# Patient Record
Sex: Male | Born: 1965 | Race: White | Hispanic: No | Marital: Married | Smoking: Never smoker
Health system: Southern US, Community
[De-identification: ages and names within clinical notes are randomized; demographics above are authoritative.]

## PROBLEM LIST (undated history)

## (undated) DIAGNOSIS — K760 Fatty (change of) liver, not elsewhere classified: Secondary | ICD-10-CM

## (undated) DIAGNOSIS — C4490 Unspecified malignant neoplasm of skin, unspecified: Secondary | ICD-10-CM

## (undated) DIAGNOSIS — E119 Type 2 diabetes mellitus without complications: Secondary | ICD-10-CM

## (undated) DIAGNOSIS — T4145XA Adverse effect of unspecified anesthetic, initial encounter: Secondary | ICD-10-CM

## (undated) DIAGNOSIS — I10 Essential (primary) hypertension: Secondary | ICD-10-CM

## (undated) DIAGNOSIS — E669 Obesity, unspecified: Secondary | ICD-10-CM

## (undated) DIAGNOSIS — K219 Gastro-esophageal reflux disease without esophagitis: Secondary | ICD-10-CM

## (undated) DIAGNOSIS — R1319 Other dysphagia: Secondary | ICD-10-CM

## (undated) DIAGNOSIS — D649 Anemia, unspecified: Secondary | ICD-10-CM

## (undated) DIAGNOSIS — T7840XA Allergy, unspecified, initial encounter: Secondary | ICD-10-CM

## (undated) DIAGNOSIS — C801 Malignant (primary) neoplasm, unspecified: Secondary | ICD-10-CM

## (undated) DIAGNOSIS — T8859XA Other complications of anesthesia, initial encounter: Secondary | ICD-10-CM

## (undated) DIAGNOSIS — E785 Hyperlipidemia, unspecified: Secondary | ICD-10-CM

## (undated) DIAGNOSIS — G4730 Sleep apnea, unspecified: Secondary | ICD-10-CM

## (undated) HISTORY — DX: Unspecified malignant neoplasm of skin, unspecified: C44.90

## (undated) HISTORY — PX: BIOPSY OF SKIN SUBCUTANEOUS TISSUE AND/OR MUCOUS MEMBRANE: SHX6741

## (undated) HISTORY — PX: TONSILLECTOMY AND ADENOIDECTOMY: SUR1326

## (undated) HISTORY — DX: Malignant (primary) neoplasm, unspecified: C80.1

## (undated) HISTORY — DX: Essential (primary) hypertension: I10

## (undated) HISTORY — DX: Anemia, unspecified: D64.9

## (undated) HISTORY — DX: Hyperlipidemia, unspecified: E78.5

## (undated) HISTORY — DX: Gastro-esophageal reflux disease without esophagitis: K21.9

## (undated) HISTORY — PX: HERNIA REPAIR: SHX51

## (undated) HISTORY — DX: Allergy, unspecified, initial encounter: T78.40XA

---

## 1998-11-08 HISTORY — PX: CHOLESTEATOMA EXCISION: SHX1345

## 2005-02-26 ENCOUNTER — Ambulatory Visit: Payer: Self-pay | Admitting: Otolaryngology

## 2006-08-29 ENCOUNTER — Other Ambulatory Visit: Payer: Self-pay

## 2006-08-29 ENCOUNTER — Emergency Department: Payer: Self-pay

## 2006-11-28 ENCOUNTER — Ambulatory Visit: Payer: Self-pay | Admitting: Internal Medicine

## 2008-11-18 ENCOUNTER — Ambulatory Visit: Payer: Self-pay | Admitting: Specialist

## 2011-09-20 ENCOUNTER — Ambulatory Visit: Payer: Self-pay | Admitting: Internal Medicine

## 2011-10-12 ENCOUNTER — Ambulatory Visit: Payer: Self-pay | Admitting: Internal Medicine

## 2011-11-12 ENCOUNTER — Encounter: Payer: Self-pay | Admitting: Internal Medicine

## 2011-11-12 ENCOUNTER — Ambulatory Visit (INDEPENDENT_AMBULATORY_CARE_PROVIDER_SITE_OTHER): Payer: PRIVATE HEALTH INSURANCE | Admitting: Internal Medicine

## 2011-11-12 VITALS — BP 134/84 | HR 84 | Temp 98.8°F | Wt 206.0 lb

## 2011-11-12 DIAGNOSIS — R1906 Epigastric swelling, mass or lump: Secondary | ICD-10-CM

## 2011-11-12 DIAGNOSIS — E785 Hyperlipidemia, unspecified: Secondary | ICD-10-CM

## 2011-11-12 DIAGNOSIS — R5381 Other malaise: Secondary | ICD-10-CM

## 2011-11-12 DIAGNOSIS — R5383 Other fatigue: Secondary | ICD-10-CM

## 2011-11-12 DIAGNOSIS — R1900 Intra-abdominal and pelvic swelling, mass and lump, unspecified site: Secondary | ICD-10-CM

## 2011-11-12 DIAGNOSIS — I10 Essential (primary) hypertension: Secondary | ICD-10-CM

## 2011-11-12 NOTE — Progress Notes (Signed)
Subjective:    Patient ID: Christopher Vazquez, male    DOB: [birth year], 46 y.o.   MRN: [id number]  HPIMr. Vazquez is a 46 yo white male with a history of dyslipidemia primarily a low HDL, and hypertensionwho presnts for followu on lipids that he has brought with him from an outside source,  Regarding his hypertension, he notes slight loss of loss of control over the last 3 weeks, without use of OTC oral decongestants, or NSAIDs.  He was treated for influenza 3 weeks ago and has not been exercising regularly as a result.  His home bps have 125-129/80's .  He has noted more  fatigue for  the past 3 weeks.  He does have a history of occasional snoring,and underwent a tonsillectomy at age 46 when OSA was diagnosed with a sleep study.  He has not had a repeat evaluation.  He notes occasional hypersomnolence in the afternoon. He has noted a weight gain of 8 lbs in the last 6 weeks . His third issue is some tenderness just below his xiphoid process accompanied by swelling, which has been present for 4 or 5 weeks.  It has not pressed ,  The swelling has not increased.  He does not report indigestion, chest pain or nausea.  Past Medical History  Diagnosis Date  . Hyperlipidemia   . Hypertension    No current outpatient prescriptions on file prior to visit.    Review of Systems  Constitutional: Positive for fatigue and unexpected weight change. Negative for fever, chills, diaphoresis, activity change and appetite change.  HENT: Negative for hearing loss, ear pain, nosebleeds, congestion, sore throat, facial swelling, rhinorrhea, sneezing, drooling, mouth sores, trouble swallowing, neck pain, neck stiffness, dental problem, voice change, postnasal drip, sinus pressure, tinnitus and ear discharge.   Eyes: Negative for photophobia, pain, discharge, redness, itching and visual disturbance.  Respiratory: Negative for apnea, cough, choking, chest tightness, shortness of breath, wheezing and stridor.   Cardiovascular:  Negative for chest pain, palpitations and leg swelling.  Gastrointestinal: Negative for nausea, vomiting, abdominal pain, diarrhea, constipation, blood in stool, abdominal distention, anal bleeding and rectal pain.  Genitourinary: Negative for dysuria, urgency, frequency, hematuria, flank pain, decreased urine volume, scrotal swelling, difficulty urinating and testicular pain.  Musculoskeletal: Negative for myalgias, back pain, joint swelling, arthralgias and gait problem.  Skin: Negative for color change, rash and wound.  Neurological: Negative for dizziness, tremors, seizures, syncope, speech difficulty, weakness, light-headedness, numbness and headaches.  Psychiatric/Behavioral: Negative for suicidal ideas, hallucinations, behavioral problems, confusion, sleep disturbance, dysphoric mood, decreased concentration and agitation. The patient is not nervous/anxious.    BP 134/84  Pulse 84  Temp(Src) 98.8 F (37.1 C) (Oral)  Wt 206 lb (93.441 kg)     Objective:   Physical Exam  Constitutional: He is oriented to person, place, and time.  HENT:  Head: Normocephalic and atraumatic.  Mouth/Throat: Oropharynx is clear and moist.  Eyes: Conjunctivae and EOM are normal.  Neck: Normal range of motion. Neck supple. No JVD present. No thyromegaly present.  Cardiovascular: Normal rate, regular rhythm and normal heart sounds.   Pulmonary/Chest: Effort normal and breath sounds normal. He has no wheezes. He has no rales.  Abdominal: Soft. Bowel sounds are normal. He exhibits mass. There is tenderness. There is rebound.    Musculoskeletal: Normal range of motion. He exhibits no edema.  Neurological: He is alert and oriented to person, place, and time.  Skin: Skin is warm and dry.  Psychiatric: He has a normal  mood and affect.     Assessment & Plan:   Mass of abdomen Given his history of hypertension and FH of AAA, will send to vascular surgery for an ultrasound of his abdomen to rule out  same  Hypertension We are increasing his enalapril/hct for diastolic elevation.   Hyperlipidemia Recommended Mediterranean diet for low HDL ,  Given intolerance to Niaspan.   Fatigue Etiology unclear, he had normal thyroi, liver and kidney  function last month when he had labs done.  May be secondary to recent influenza.  Recommended resuming his exercise program and reevaluate symptom in 4 weeks.  Consider repeat Sleep study is labs are normal.     Updated Medication List Outpatient Encounter Prescriptions as of 11/12/2011  Medication Sig Dispense Refill  . aspirin 81 MG tablet Take 81 mg by mouth daily.        . Enalapril-Hydrochlorothiazide 5-12.5 MG per tablet Take 1 tablet by mouth daily.        . fenofibrate 160 MG tablet Take 160 mg by mouth daily.        . simvastatin (ZOCOR) 20 MG tablet Take 20 mg by mouth every evening.

## 2011-11-12 NOTE — Patient Instructions (Signed)
Increase enalapril combo to 2 pills daily in the am  You may enjoy a glass of wine at dinner,  It may help your HDL  Ask you wife about your breathing pattern.  Get back to the gym on a regular basis,  If the fatigue isn't better in 4 weeks,  Call for additional labs.   We are ordering an abdominal aortic ultrasound

## 2011-11-14 ENCOUNTER — Encounter: Payer: Self-pay | Admitting: Internal Medicine

## 2011-11-14 DIAGNOSIS — I10 Essential (primary) hypertension: Secondary | ICD-10-CM | POA: Insufficient documentation

## 2011-11-14 DIAGNOSIS — R1900 Intra-abdominal and pelvic swelling, mass and lump, unspecified site: Secondary | ICD-10-CM | POA: Insufficient documentation

## 2011-11-14 DIAGNOSIS — E785 Hyperlipidemia, unspecified: Secondary | ICD-10-CM | POA: Insufficient documentation

## 2011-11-14 DIAGNOSIS — R5383 Other fatigue: Secondary | ICD-10-CM | POA: Insufficient documentation

## 2011-11-14 NOTE — Assessment & Plan Note (Signed)
We are increasing his enalapril/hct for diastolic elevation.

## 2011-11-14 NOTE — Assessment & Plan Note (Signed)
Given his history of hypertension and FH of AAA, will send to vascular surgery for an ultrasound of his abdomen to rule out same

## 2011-11-14 NOTE — Assessment & Plan Note (Signed)
Recommended Mediterranean diet for low HDL ,  Given intolerance to Niaspan.

## 2011-11-14 NOTE — Assessment & Plan Note (Signed)
Etiology unclear, he had normal thyroi, liver and kidney  function last month when he had labs done.  May be secondary to recent influenza.  Recommended resuming his exercise program and reevaluate symptom in 4 weeks.  Consider repeat Sleep study is labs are normal.

## 2011-11-30 ENCOUNTER — Telehealth: Payer: Self-pay | Admitting: Internal Medicine

## 2011-11-30 NOTE — Telephone Encounter (Signed)
Advised patient

## 2011-11-30 NOTE — Telephone Encounter (Signed)
His abdominal ultrasoudn was normal.  No aneurysm

## 2011-12-13 ENCOUNTER — Encounter: Payer: Self-pay | Admitting: Internal Medicine

## 2012-01-07 ENCOUNTER — Ambulatory Visit (INDEPENDENT_AMBULATORY_CARE_PROVIDER_SITE_OTHER): Payer: PRIVATE HEALTH INSURANCE | Admitting: Internal Medicine

## 2012-01-07 ENCOUNTER — Encounter: Payer: Self-pay | Admitting: Internal Medicine

## 2012-01-07 VITALS — BP 112/78 | HR 84 | Temp 98.3°F | Resp 12 | Wt 201.0 lb

## 2012-01-07 DIAGNOSIS — L0390 Cellulitis, unspecified: Secondary | ICD-10-CM

## 2012-01-07 DIAGNOSIS — A4902 Methicillin resistant Staphylococcus aureus infection, unspecified site: Secondary | ICD-10-CM

## 2012-01-07 DIAGNOSIS — M130 Polyarthritis, unspecified: Secondary | ICD-10-CM

## 2012-01-07 DIAGNOSIS — I10 Essential (primary) hypertension: Secondary | ICD-10-CM

## 2012-01-07 DIAGNOSIS — L0291 Cutaneous abscess, unspecified: Secondary | ICD-10-CM

## 2012-01-07 DIAGNOSIS — B9562 Methicillin resistant Staphylococcus aureus infection as the cause of diseases classified elsewhere: Secondary | ICD-10-CM

## 2012-01-07 MED ORDER — ENALAPRIL-HYDROCHLOROTHIAZIDE 10-25 MG PO TABS: 1.0000 | ORAL_TABLET | Freq: Every day | ORAL | Status: DC

## 2012-01-07 MED ORDER — SULFAMETHOXAZOLE-TRIMETHOPRIM 800-160 MG PO TABS: 1.0000 | ORAL_TABLET | Freq: Two times a day (BID) | ORAL | Status: AC

## 2012-01-07 MED ORDER — MELOXICAM 15 MG PO TABS: 15.0000 mg | ORAL_TABLET | Freq: Every day | ORAL | Status: DC

## 2012-01-07 NOTE — Progress Notes (Signed)
Subjective:    Patient ID: Christopher Vazquez, male    DOB: 04/03/1966, 46 y.o.   MRN: [id number]  HPI   Christopher Vazquez is a 46 yo white male with a history of hypertension and hyperlipidemia  who presents with 2 complaints.  ) He has been having joint pain involving bilateral hands, knees, shoulders, and wrists for 2 months.  His pain is greatest in the morning  and brought on with inactivity and improves after 20 minutes and after standing in a hot shower.  2) 2nd issue is the recurrence of skin boils  Which are occurring on his arms, legs and dorsum of feet.  He was treated for this in the past with oral Septra and lesions resolved.  No recent use of public gym or swlimming, no recent changes in medications , soaps or detergents.   Past Medical History  Diagnosis Date  . Hyperlipidemia   . Hypertension    Current Outpatient Prescriptions on File Prior to Visit  Medication Sig Dispense Refill  . aspirin 81 MG tablet Take 81 mg by mouth daily.       . fenofibrate 160 MG tablet Take 160 mg by mouth daily.        . simvastatin (ZOCOR) 20 MG tablet Take 20 mg by mouth every evening.          Review of Systems  Constitutional: Positive for fatigue and unexpected weight change. Negative for fever, chills, diaphoresis, activity change and appetite change.  HENT: Negative for hearing loss, ear pain, nosebleeds, congestion, sore throat, facial swelling, rhinorrhea, sneezing, drooling, mouth sores, trouble swallowing, neck pain, neck stiffness, dental problem, voice change, postnasal drip, sinus pressure, tinnitus and ear discharge.   Eyes: Negative for photophobia, pain, discharge, redness, itching and visual disturbance.  Respiratory: Negative for apnea, cough, choking, chest tightness, shortness of breath, wheezing and stridor.   Cardiovascular: Negative for chest pain, palpitations and leg swelling.  Gastrointestinal: Negative for nausea, vomiting, abdominal pain, diarrhea, constipation, blood in stool,  abdominal distention, anal bleeding and rectal pain.  Genitourinary: Negative for dysuria, urgency, frequency, hematuria, flank pain, decreased urine volume, scrotal swelling, difficulty urinating and testicular pain.  Musculoskeletal: Positive for arthralgias. Negative for myalgias, back pain, joint swelling and gait problem.  Skin: Positive for rash. Negative for color change and wound.  Neurological: Negative for dizziness, tremors, seizures, syncope, speech difficulty, weakness, light-headedness, numbness and headaches.  Psychiatric/Behavioral: Negative for suicidal ideas, hallucinations, behavioral problems, confusion, sleep disturbance, dysphoric mood, decreased concentration and agitation. The patient is not nervous/anxious.        Objective:   Physical Exam  Constitutional: He is oriented to person, place, and time.  HENT:  Head: Normocephalic and atraumatic.  Mouth/Throat: Oropharynx is clear and moist.  Eyes: Conjunctivae and EOM are normal.  Neck: Normal range of motion. Neck supple. No JVD present. No thyromegaly present.  Cardiovascular: Normal rate, regular rhythm and normal heart sounds.   Pulmonary/Chest: Effort normal and breath sounds normal. He has no wheezes. He has no rales.  Abdominal: Soft. Bowel sounds are normal. He exhibits no mass. There is no tenderness. There is no rebound.  Musculoskeletal: Normal range of motion. He exhibits no edema.  Neurological: He is alert and oriented to person, place, and time.  Skin: Skin is warm and dry. Rash noted. Rash is pustular.       Discrete lesions, some pustular, some maculopapular  diffusely scattered on arms, legs and feet (dorsal side)  Psychiatric: He has a  normal mood and affect.      Assessment & Plan:   Polyarthritis He has no signs of synovitis on exam but his history is concerning for autoimmune /inflammatory arthritis and he has a strong FH of RA.  Checking serologies, trial of meloxciam.   MRSA  cellulitis Suspected. Will treat with bactrim for one week recommend use of hibiclens once weekly and continued [place] of antibacterial soap.  If no resolution will refer to [facility] ID specialist for evaluation.    Hypertension Well controlled on current medications.  No changes today.    Updated Medication List Outpatient Encounter Prescriptions as of 01/07/2012  Medication Sig Dispense Refill  . aspirin 81 MG tablet Take 81 mg by mouth daily.       . fenofibrate 160 MG tablet Take 160 mg by mouth daily.        . simvastatin (ZOCOR) 20 MG tablet Take 20 mg by mouth every evening.        Marland Kitchen DISCONTD: Enalapril-Hydrochlorothiazide 5-12.5 MG per tablet Take 1 tablet by mouth 2 (two) times daily.       . enalapril-hydrochlorothiazide (VASERETIC) 10-25 MG per tablet Take 1 tablet by mouth daily.  90 tablet  3  . meloxicam (MOBIC) 15 MG tablet Take 1 tablet (15 mg total) by mouth daily.  30 tablet  0  . sulfamethoxazole-trimethoprim (SEPTRA DS) 800-160 MG per tablet Take 1 tablet by mouth 2 (two) times daily.  14 tablet  0

## 2012-01-07 NOTE — Patient Instructions (Signed)
We are going to do a week of Septra Ds one  tablet twice daily x 7 days.  Once a week bathe with HibiClens .

## 2012-01-09 DIAGNOSIS — B9562 Methicillin resistant Staphylococcus aureus infection as the cause of diseases classified elsewhere: Secondary | ICD-10-CM | POA: Insufficient documentation

## 2012-01-09 NOTE — Assessment & Plan Note (Signed)
Suspected. Will treat with bactrim for one week recommend use of hibiclens once weekly and continued [place] of antibacterial soap.  If no resolution will refer to [facility] ID specialist for evaluation.

## 2012-01-09 NOTE — Assessment & Plan Note (Addendum)
He has no signs of synovitis on exam but his history is concerning for autoimmune /inflammatory arthritis and he has a strong FH of RA.  Checking serologies, trial of meloxciam.

## 2012-01-09 NOTE — Assessment & Plan Note (Signed)
Well controlled on current medications.  No changes today. 

## 2012-01-14 ENCOUNTER — Telehealth: Payer: Self-pay | Admitting: Internal Medicine

## 2012-01-14 NOTE — Telephone Encounter (Signed)
Notified patient of results 

## 2012-01-14 NOTE — Telephone Encounter (Signed)
No signs of inflammatory arthritis on recent labs

## 2012-02-03 ENCOUNTER — Encounter: Payer: Self-pay | Admitting: Internal Medicine

## 2012-02-08 MED ORDER — MELOXICAM 15 MG PO TABS: 15.0000 mg | ORAL_TABLET | Freq: Every day | ORAL | Status: DC

## 2012-02-08 MED ORDER — SULFAMETHOXAZOLE-TRIMETHOPRIM 800-160 MG PO TABS: 1.0000 | ORAL_TABLET | Freq: Two times a day (BID) | ORAL | Status: AC

## 2012-03-06 ENCOUNTER — Other Ambulatory Visit: Payer: Self-pay | Admitting: Internal Medicine

## 2012-03-06 MED ORDER — MELOXICAM 15 MG PO TABS: 15.0000 mg | ORAL_TABLET | Freq: Every day | ORAL | Status: AC

## 2012-03-31 ENCOUNTER — Telehealth: Payer: Self-pay | Admitting: *Deleted

## 2012-03-31 DIAGNOSIS — N178 Other acute kidney failure: Secondary | ICD-10-CM

## 2012-03-31 MED ORDER — ERGOCALCIFEROL 1.25 MG (50000 UT) PO CAPS: 50000.0000 [IU] | ORAL_CAPSULE | ORAL | Status: DC

## 2012-03-31 NOTE — Telephone Encounter (Signed)
Cholesterol, fine, vitamin d is low .  And kidney function is a little bit off an I have no prior labs for comparison.  I woul lkike him to 1) take 50,000 units of Vit D (Drisdol ) weekly for one month I wil call to pharmacy  2) Repeat a BMET ine one week in a nonfasting state to see if kidney fucntion is better,  I will put in [facility] order as well

## 2012-03-31 NOTE — Telephone Encounter (Signed)
Request for lab results; no report found in EMR/SLS Please advise.

## 2012-04-04 NOTE — Telephone Encounter (Signed)
Patient informed; requested lab order printed for work lab/[facility]-done/SLS

## 2012-04-07 ENCOUNTER — Encounter: Payer: Self-pay | Admitting: Internal Medicine

## 2012-04-07 ENCOUNTER — Ambulatory Visit (INDEPENDENT_AMBULATORY_CARE_PROVIDER_SITE_OTHER): Payer: PRIVATE HEALTH INSURANCE | Admitting: Internal Medicine

## 2012-04-07 VITALS — BP 118/80 | HR 82 | Temp 98.1°F | Resp 16 | Wt 200.5 lb

## 2012-04-07 DIAGNOSIS — A4902 Methicillin resistant Staphylococcus aureus infection, unspecified site: Secondary | ICD-10-CM

## 2012-04-07 DIAGNOSIS — B9562 Methicillin resistant Staphylococcus aureus infection as the cause of diseases classified elsewhere: Secondary | ICD-10-CM

## 2012-04-07 DIAGNOSIS — L0291 Cutaneous abscess, unspecified: Secondary | ICD-10-CM

## 2012-04-07 DIAGNOSIS — I10 Essential (primary) hypertension: Secondary | ICD-10-CM

## 2012-04-07 DIAGNOSIS — E785 Hyperlipidemia, unspecified: Secondary | ICD-10-CM

## 2012-04-07 DIAGNOSIS — M130 Polyarthritis, unspecified: Secondary | ICD-10-CM

## 2012-04-07 NOTE — Patient Instructions (Signed)
Consider the Low Glycemic Index Diet and 6 smaller meals daily .  This boosts your metabolism and regulates your sugars:   7 AM Low carbohydrate Protein  Shakes (EAS Carb Control  Or Atkins ,  Available everywhere,   In  cases at [employer] )  2.5 carbs  (Add or substitute a toasted sandwhich thin w/ peanut butter)  10 AM: Protein bar by Atkins (snack size,  Chocolate lover's variety at  [employer])    Lunch: sandwich on pita bread or flatbread (Vedh's makes a pita bread and a flat bread , available at [employer] and [employer]; Toufayah makes a low carb flatbread available at [employer] and [employer]) Mission makes a low carb whole wheat tortilla available at [employer],and most grocery stores   3 PM:  Mid day :  Another protein bar,  Or a  cheese stick, 1/4 cup of almonds, walnuts, pistachios, pecans, peanuts,  Macadamia nuts  6 PM  Dinner:  "mean and green:"  Meat/chicken/fish, salad, and green veggie : use ranch, vinagrette,  Blue cheese, etc  9 PM snack : Breyer's low carb fudgsicle or  ice cream bar (Carb Smart), or  Weight Watcher's ice cream bar , or another protein shake  

## 2012-04-07 NOTE — Progress Notes (Signed)
Patient ID: Christopher Vazquez, male   DOB: 1965-11-29, 46 y.o.   MRN: [id number]  Patient Active Problem List  Diagnoses  . Hyperlipidemia  . Hypertension  . Mass of abdomen  . Fatigue  . Polyarthritis  . MRSA cellulitis    Subjective:  CC:   Chief Complaint  Patient presents with  . Follow-up    HPI:   Christopher Vazquez a 46 y.o. male who presents for follow up on skin rash and arthritis, along with hypertension.  He was treated for folliculitis by dermatologist with topical clindamcyin , to be used daily for 4 months along with once weekly hibiclens .  Skin has cleared up.  [facility]. His previous visit for workup of  Arthritis has resulted in a normal serologic evaluation.  The pain has resolved spontaneously.  He is no longer using meloxicam.     Past Medical History  Diagnosis Date  . Hyperlipidemia   . Hypertension     History reviewed. No pertinent past surgical history.       The following portions of the patient's history were reviewed and updated as appropriate: Allergies, current medications, and problem list.    Review of Systems:   12 Pt  review of systems was negative except those addressed in the HPI,     History   Social History  . Marital Status: Married    Spouse Name: N/A    Number of Children: N/A  . Years of Education: N/A   Occupational History  . Not on file.   Social History Main Topics  . Smoking status: Never Smoker   . Smokeless tobacco: Never Used  . Alcohol Use: Not on file  . Drug Use: Not on file  . Sexually Active: Not on file   Other Topics Concern  . Not on file   Social History Narrative  . No narrative on file    Objective:  BP 118/80  Pulse 82  Temp(Src) 98.1 F (36.7 C) (Oral)  Resp 16  Wt 200 lb 8 oz (90.946 kg)  SpO2 97%  General appearance: alert, cooperative and appears stated age Ears: normal TM's and external ear canals both ears Throat: lips, mucosa, and tongue normal;  teeth and gums normal Neck: no adenopathy, no carotid bruit, supple, symmetrical, trachea midline and thyroid not enlarged, symmetric, no tenderness/mass/nodules Back: symmetric, no curvature. ROM normal. No CVA tenderness. Lungs: clear to auscultation bilaterally Heart: regular rate and rhythm, S1, S2 normal, no murmur, click, rub or gallop Abdomen: soft, non-tender; bowel sounds normal; no masses,  no organomegaly Pulses: 2+ and symmetric Skin: Skin color, texture, turgor normal. No rashes or lesions Lymph nodes: Cervical, supraclavicular, and axillary nodes normal.  Assessment and Plan:  Hypertension Well controlled on current medications.  No changes today.  MRSA cellulitis Now fresolved with topical clindamycin and weekly hibiclens.   Polyarthritis All serologies were negative,  And symptoms have resolved.   Hyperlipidemia Managed with simvastatin,  Repeat lipids and LFTs are due     Updated Medication List Outpatient Encounter Prescriptions as of 04/07/2012  Medication Sig Dispense Refill  . aspirin 81 MG tablet Take 81 mg by mouth daily.       . enalapril-hydrochlorothiazide (VASERETIC) 10-25 MG per tablet Take 1 tablet by mouth daily.  90 tablet  3  . ergocalciferol (DRISDOL) 50000 UNITS capsule Take 1 capsule (50,000 Units total) by mouth once a week.  4 capsule  0  . fenofibrate 160 MG tablet Take 160  mg by mouth daily.        . meloxicam (MOBIC) 15 MG tablet Take 1 tablet (15 mg total) by mouth daily.  90 tablet  2  . simvastatin (ZOCOR) 20 MG tablet Take 20 mg by mouth every evening.

## 2012-04-09 ENCOUNTER — Encounter: Payer: Self-pay | Admitting: Internal Medicine

## 2012-04-09 NOTE — Assessment & Plan Note (Signed)
Managed with simvastatin,  Repeat lipids and LFTs are due

## 2012-04-09 NOTE — Assessment & Plan Note (Signed)
Now fresolved with topical clindamycin and weekly hibiclens.

## 2012-04-09 NOTE — Assessment & Plan Note (Signed)
Well controlled on current medications.  No changes today. 

## 2012-04-09 NOTE — Assessment & Plan Note (Signed)
All serologies were negative,  And symptoms have resolved.

## 2012-04-11 ENCOUNTER — Telehealth: Payer: Self-pay | Admitting: Internal Medicine

## 2012-04-11 NOTE — Telephone Encounter (Signed)
his metabolic panel was fine.  Continue currentmedicaitons

## 2012-04-11 NOTE — Telephone Encounter (Signed)
Let detailed message notifying patient.

## 2012-04-17 ENCOUNTER — Encounter: Payer: Self-pay | Admitting: Internal Medicine

## 2012-04-18 ENCOUNTER — Other Ambulatory Visit: Payer: Self-pay | Admitting: Internal Medicine

## 2012-04-19 ENCOUNTER — Encounter: Payer: Self-pay | Admitting: Internal Medicine

## 2012-04-20 ENCOUNTER — Other Ambulatory Visit: Payer: Self-pay | Admitting: *Deleted

## 2012-04-20 MED ORDER — FENOFIBRATE 160 MG PO TABS: 160.0000 mg | ORAL_TABLET | Freq: Every day | ORAL | Status: DC

## 2012-05-16 ENCOUNTER — Ambulatory Visit: Payer: Self-pay | Admitting: Family Medicine

## 2012-07-11 ENCOUNTER — Ambulatory Visit: Payer: PRIVATE HEALTH INSURANCE | Admitting: Internal Medicine

## 2012-07-25 ENCOUNTER — Encounter: Payer: Self-pay | Admitting: Internal Medicine

## 2012-07-25 DIAGNOSIS — K649 Unspecified hemorrhoids: Secondary | ICD-10-CM

## 2012-07-25 MED ORDER — HYDROCORTISONE ACE-PRAMOXINE 1-1 % RE FOAM: 1.0000 | Freq: Two times a day (BID) | RECTAL | Status: DC

## 2012-08-21 ENCOUNTER — Other Ambulatory Visit: Payer: Self-pay | Admitting: Internal Medicine

## 2012-08-24 ENCOUNTER — Other Ambulatory Visit: Payer: Self-pay | Admitting: Internal Medicine

## 2012-08-24 DIAGNOSIS — E785 Hyperlipidemia, unspecified: Secondary | ICD-10-CM

## 2012-08-24 DIAGNOSIS — Z79899 Other long term (current) drug therapy: Secondary | ICD-10-CM

## 2012-08-28 MED ORDER — FENOFIBRATE 160 MG PO TABS: 160.0000 mg | ORAL_TABLET | Freq: Every day | ORAL | Status: DC

## 2012-08-28 NOTE — Telephone Encounter (Signed)
Ok to refill 

## 2012-08-31 ENCOUNTER — Other Ambulatory Visit: Payer: Self-pay

## 2012-09-11 ENCOUNTER — Telehealth: Payer: Self-pay | Admitting: Internal Medicine

## 2012-09-11 NOTE — Telephone Encounter (Signed)
Labss reviewed. His cholesterol is fine except his HDL was a little low at 32;  this can be improved with exercise and in the Mediterranean diet. His hemoglobin A1c was slightly high which means he is to be careful about his carbohydrates and weight to prevent development of diabetes..  his vitamin D level was slightly low at 29. He needs to be taking 1000 units of vitamin D daily.

## 2012-09-14 NOTE — Telephone Encounter (Signed)
Called patient to let him know results.

## 2012-12-23 ENCOUNTER — Other Ambulatory Visit: Payer: Self-pay

## 2012-12-25 ENCOUNTER — Encounter: Payer: Self-pay | Admitting: Internal Medicine

## 2012-12-25 MED ORDER — ENALAPRIL-HYDROCHLOROTHIAZIDE 10-25 MG PO TABS: 1.0000 | ORAL_TABLET | Freq: Every day | ORAL | Status: DC

## 2013-02-06 ENCOUNTER — Ambulatory Visit: Payer: Self-pay | Admitting: Internal Medicine

## 2013-02-06 ENCOUNTER — Encounter: Payer: Self-pay | Admitting: Adult Health

## 2013-02-06 ENCOUNTER — Ambulatory Visit (INDEPENDENT_AMBULATORY_CARE_PROVIDER_SITE_OTHER): Payer: BC Managed Care – PPO | Admitting: Adult Health

## 2013-02-06 VITALS — BP 144/88 | HR 83 | Temp 98.8°F | Ht 67.0 in | Wt 204.0 lb

## 2013-02-06 DIAGNOSIS — R1011 Right upper quadrant pain: Secondary | ICD-10-CM | POA: Insufficient documentation

## 2013-02-06 NOTE — Progress Notes (Signed)
  Subjective:    Patient ID: Christopher Vazquez, male    DOB: August 13, 1966, 47 y.o.   MRN: [id number]  HPI  Patient is a pleasant 47 y/o male who presents to clinic with a 2 week hx of epigastric, RUQ pain post meals. The pain radiates to his back. He is also experiencing some LUQ tenderness. Patient saw a NP at his job who ran blood work and started him on PPI. He was going to have an abdominal ultrasound done but contracted the Arna Medici virus from his son. He was sick for ~ 1 week. His symptoms remain. He denies fever, chills.    Current Outpatient Prescriptions on File Prior to Visit  Medication Sig Dispense Refill  . aspirin 81 MG tablet Take 81 mg by mouth daily.       . enalapril-hydrochlorothiazide (VASERETIC) 10-25 MG per tablet Take 1 tablet by mouth daily.  90 tablet  3  . fenofibrate 160 MG tablet Take 1 tablet (160 mg total) by mouth daily.  90 tablet  2  . simvastatin (ZOCOR) 20 MG tablet TAKE 1 TABLET AT BEDTIME  90 tablet  3  . ergocalciferol (DRISDOL) 50000 UNITS capsule Take 1 capsule (50,000 Units total) by mouth once a week.  4 capsule  0  . hydrocortisone-pramoxine (PROCTOFOAM HC) rectal foam Place 1 applicator rectally 2 (two) times daily.  10 g  0  . meloxicam (MOBIC) 15 MG tablet Take 1 tablet (15 mg total) by mouth daily.  90 tablet  2   No current facility-administered medications on file prior to visit.     Review of Systems  Constitutional: Negative for fever and chills.  Gastrointestinal: Positive for nausea. Negative for vomiting and blood in stool.       RUQ pain, radiating to back       Objective:   Physical Exam  Constitutional: He is oriented to person, place, and time. He appears well-developed and well-nourished. No distress.  Cardiovascular: Normal rate and regular rhythm.   Pulmonary/Chest: Effort normal.  Abdominal: Soft. Bowel sounds are normal. He exhibits no mass. There is tenderness. There is no guarding.  Positive Murphy's sign  Neurological: He is  alert and oriented to person, place, and time.  Skin: Skin is warm and dry.  Psychiatric: He has a normal mood and affect. His behavior is normal. Judgment and thought content normal.          Assessment & Plan:

## 2013-02-06 NOTE — Patient Instructions (Signed)
  Please go to [facility] for ultrasound

## 2013-02-06 NOTE — Assessment & Plan Note (Signed)
Symptoms ongoing for ~ 2 weeks. Occur post meals; radiating to his back. Ordered ultrasound limited RUQ. Patient is not NPO long enough to have a complete abdomen

## 2013-02-07 ENCOUNTER — Telehealth: Payer: Self-pay | Admitting: General Practice

## 2013-02-07 NOTE — Telephone Encounter (Signed)
Yes, I have results. I am calling patient. Christopher Vazquez

## 2013-02-07 NOTE — Telephone Encounter (Signed)
Pt was seen yesterday by Raquel and sent for [place] results. Please advise if anything has came back on these.

## 2013-02-07 NOTE — Telephone Encounter (Signed)
Noted  

## 2013-02-08 ENCOUNTER — Other Ambulatory Visit: Payer: Self-pay | Admitting: Adult Health

## 2013-02-08 ENCOUNTER — Encounter: Payer: Self-pay | Admitting: Gastroenterology

## 2013-02-08 DIAGNOSIS — K760 Fatty (change of) liver, not elsewhere classified: Secondary | ICD-10-CM

## 2013-02-08 NOTE — Progress Notes (Signed)
  Patient is on simvastatin and fenofibrate. US abdomen showing changes consistent with fatty infiltration of liver. Referral to GI made

## 2013-02-09 NOTE — Telephone Encounter (Signed)
I spoke with patient yesterday

## 2013-02-09 NOTE — Telephone Encounter (Signed)
Pt called back again today stating he is waiting on a phone call.

## 2013-02-14 ENCOUNTER — Telehealth: Payer: Self-pay | Admitting: Internal Medicine

## 2013-02-14 ENCOUNTER — Telehealth: Payer: Self-pay | Admitting: Adult Health

## 2013-02-14 ENCOUNTER — Encounter: Payer: Self-pay | Admitting: Adult Health

## 2013-02-14 ENCOUNTER — Other Ambulatory Visit: Payer: Self-pay | Admitting: Adult Health

## 2013-02-14 MED ORDER — PANTOPRAZOLE SODIUM 40 MG PO TBEC: 40.0000 mg | DELAYED_RELEASE_TABLET | Freq: Every day | ORAL | Status: DC

## 2013-02-14 NOTE — Telephone Encounter (Signed)
Change omeprazole to protonix.

## 2013-02-14 NOTE — Telephone Encounter (Signed)
Patient notified of results.

## 2013-02-14 NOTE — Telephone Encounter (Signed)
Liver function was normal basic metabolic panel unchanged and cholesterol unchanged. Continue current medications.

## 2013-02-14 NOTE — Telephone Encounter (Signed)
Liver function was normal

## 2013-03-07 ENCOUNTER — Ambulatory Visit (INDEPENDENT_AMBULATORY_CARE_PROVIDER_SITE_OTHER): Payer: BC Managed Care – PPO | Admitting: Gastroenterology

## 2013-03-07 ENCOUNTER — Encounter: Payer: Self-pay | Admitting: Gastroenterology

## 2013-03-07 VITALS — BP 108/66 | HR 80 | Ht 67.0 in | Wt 204.8 lb

## 2013-03-07 DIAGNOSIS — K219 Gastro-esophageal reflux disease without esophagitis: Secondary | ICD-10-CM | POA: Insufficient documentation

## 2013-03-07 DIAGNOSIS — R1310 Dysphagia, unspecified: Secondary | ICD-10-CM | POA: Insufficient documentation

## 2013-03-07 DIAGNOSIS — K7689 Other specified diseases of liver: Secondary | ICD-10-CM

## 2013-03-07 DIAGNOSIS — K760 Fatty (change of) liver, not elsewhere classified: Secondary | ICD-10-CM | POA: Insufficient documentation

## 2013-03-07 NOTE — Progress Notes (Signed)
History of Present Illness: Pleasant 47 year old white male referred at the request of Dr. Darrick Huntsman for evaluation of upper abdominal chest pain. For several weeks he has been complaining of postprandial left upper quadrant discomfort radiating to his chest. It is accompanied by excess belching and pyrosis. He initially took omeprazole with minimal results. After switching to protonix symptoms have improved although they remain. He complains of dysphagia to solids. He's on no gastric irritants including nonsteroidals. He apparently underwent an abdominal ultrasound that demonstrated a fatty liver. LFTs apparently were normal.      Past Medical History  Diagnosis Date  . Hyperlipidemia   . Hypertension    Past Surgical History  Procedure Laterality Date  . Tonsillectomy and adenoidectomy    . Hernia repair     family history includes Aortic aneurysm (age of onset: 91) in his father; Diabetes in his mother; Heart disease (age of onset: 29) in his father; and Hypertension in his mother. Current Outpatient Prescriptions  Medication Sig Dispense Refill  . aspirin 81 MG tablet Take 81 mg by mouth daily.       . enalapril-hydrochlorothiazide (VASERETIC) 10-25 MG per tablet Take 1 tablet by mouth daily.  90 tablet  3  . fenofibrate 160 MG tablet Take 1 tablet (160 mg total) by mouth daily.  90 tablet  2  . pantoprazole (PROTONIX) 40 MG tablet Take 1 tablet (40 mg total) by mouth daily.  30 tablet  5  . simvastatin (ZOCOR) 20 MG tablet TAKE 1 TABLET AT BEDTIME  90 tablet  3   No current facility-administered medications for this visit.   Allergies as of 03/07/2013  . (No Known Allergies)    reports that he has never smoked. He has never used smokeless tobacco. His alcohol and drug histories are not on file.     Review of Systems: Pertinent positive and negative review of systems were noted in the above HPI section. All other review of systems were otherwise negative.  Vital signs were reviewed  in today's medical record Physical Exam: General: Well developed , well nourished, no acute distress Skin: anicteric Head: Normocephalic and atraumatic Eyes:  sclerae anicteric, EOMI Ears: Normal auditory acuity Mouth: No deformity or lesions Neck: Supple, no masses or thyromegaly Lungs: Clear throughout to auscultation Heart: Regular rate and rhythm; no murmurs, rubs or bruits Abdomen: Soft, non tender and non distended. No masses, hepatosplenomegaly or hernias noted. Normal Bowel sounds Rectal:deferred Musculoskeletal: Symmetrical with no gross deformities  Skin: No lesions on visible extremities Pulses:  Normal pulses noted Extremities: No clubbing, cyanosis, edema or deformities noted Neurological: Alert oriented x 4, grossly nonfocal Cervical Nodes:  No significant cervical adenopathy Inguinal Nodes: No significant inguinal adenopathy Psychological:  Alert and cooperative. Normal mood and affect

## 2013-03-07 NOTE — Assessment & Plan Note (Addendum)
Symptoms have improved with protonix Plan to continue with the same

## 2013-03-07 NOTE — Patient Instructions (Addendum)
You have been scheduled for an endoscopy with propofol. Please follow written instructions given to you at your visit today. If you use inhalers (even only as needed), please bring them with you on the day of your procedure. Your physician has requested that you go to www.startemmi.com and enter the access code given to you at your visit today. This web site gives a general overview about your procedure. However, you should still follow specific instructions given to you by our office regarding your preparation for the procedure.   Gastroesophageal Reflux Disease, Adult Gastroesophageal reflux disease (GERD) happens when acid from your stomach flows up into the esophagus. When acid comes in contact with the esophagus, the acid causes soreness (inflammation) in the esophagus. Over time, GERD may create small holes (ulcers) in the lining of the esophagus. CAUSES   Increased body weight. This puts pressure on the stomach, making acid rise from the stomach into the esophagus.  Smoking. This increases acid production in the stomach.  Drinking alcohol. This causes decreased pressure in the lower esophageal sphincter (valve or ring of muscle between the esophagus and stomach), allowing acid from the stomach into the esophagus.  Late evening meals and a full stomach. This increases pressure and acid production in the stomach.  A malformed lower esophageal sphincter. Sometimes, no cause is found. SYMPTOMS   Burning pain in the lower part of the mid-chest behind the breastbone and in the mid-stomach area. This may occur twice a week or more often.  Trouble swallowing.  Sore throat.  Dry cough.  Asthma-like symptoms including chest tightness, shortness of breath, or wheezing. DIAGNOSIS  Your caregiver may be able to diagnose GERD based on your symptoms. In some cases, X-rays and other tests may be done to check for complications or to check the condition of your stomach and esophagus. TREATMENT    Your caregiver may recommend over-the-counter or prescription medicines to help decrease acid production. Ask your caregiver before starting or adding any new medicines.  HOME CARE INSTRUCTIONS   Change the factors that you can control. Ask your caregiver for guidance concerning weight loss, quitting smoking, and alcohol consumption.  Avoid foods and drinks that make your symptoms worse, such as:  Caffeine or alcoholic drinks.  Chocolate.  Peppermint or mint flavorings.  Garlic and onions.  Spicy foods.  Citrus fruits, such as oranges, lemons, or limes.  Tomato-based foods such as sauce, chili, salsa, and pizza.  Fried and fatty foods.  Avoid lying down for the 3 hours prior to your bedtime or prior to taking a nap.  Eat small, frequent meals instead of large meals.  Wear loose-fitting clothing. Do not wear anything tight around your waist that causes pressure on your stomach.  Raise the head of your bed 6 to 8 inches with wood blocks to help you sleep. Extra pillows will not help.  Only take over-the-counter or prescription medicines for pain, discomfort, or fever as directed by your caregiver.  Do not take aspirin, ibuprofen, or other nonsteroidal anti-inflammatory drugs (NSAIDs). SEEK IMMEDIATE MEDICAL CARE IF:   You have pain in your arms, neck, jaw, teeth, or back.  Your pain increases or changes in intensity or duration.  You develop nausea, vomiting, or sweating (diaphoresis).  You develop shortness of breath, or you faint.  Your vomit is green, yellow, black, or looks like coffee grounds or blood.  Your stool is red, bloody, or black. These symptoms could be signs of other problems, such as heart disease, gastric  bleeding, or esophageal bleeding. MAKE SURE YOU:   Understand these instructions.  Will watch your condition.  Will get help right away if you are not doing well or get worse. Document Released: 08/04/2005 Document Revised: 01/17/2012 Document  Reviewed: 05/14/2011 [facility] Patient Information 2013 [place], [place].

## 2013-03-07 NOTE — Assessment & Plan Note (Signed)
Rule out early esophageal stricture  Recommendations #1 upper endoscopy with dilatation as indicated  Risks, alternatives, and complications of the procedure, including bleeding, perforation, and possible need for surgery, were explained to the patient.  Patient's questions were answered.  

## 2013-03-08 ENCOUNTER — Encounter: Payer: Self-pay | Admitting: Internal Medicine

## 2013-03-13 ENCOUNTER — Ambulatory Visit (AMBULATORY_SURGERY_CENTER): Payer: BC Managed Care – PPO | Admitting: Gastroenterology

## 2013-03-13 ENCOUNTER — Encounter: Payer: Self-pay | Admitting: Gastroenterology

## 2013-03-13 VITALS — BP 105/62 | HR 76 | Temp 98.4°F | Resp 16 | Ht 67.0 in | Wt 204.0 lb

## 2013-03-13 DIAGNOSIS — K219 Gastro-esophageal reflux disease without esophagitis: Secondary | ICD-10-CM

## 2013-03-13 DIAGNOSIS — K7689 Other specified diseases of liver: Secondary | ICD-10-CM

## 2013-03-13 DIAGNOSIS — K222 Esophageal obstruction: Secondary | ICD-10-CM

## 2013-03-13 MED ORDER — SODIUM CHLORIDE 0.9 % IV SOLN: 500.0000 mL | INTRAVENOUS | Status: DC

## 2013-03-13 NOTE — Op Note (Signed)
 [facility] [address] [place], [address]   ENDOSCOPY PROCEDURE REPORT  PATIENT: Christopher Vazquez, Christopher Vazquez  MR#: [id number] BIRTHDATE: [birth year] , 46  yrs. old GENDER: Male ENDOSCOPIST: Louis Meckel, MD REFERRED BY:  Junie Panning, MD PROCEDURE DATE:  03/13/2013 PROCEDURE:  EGD, diagnostic and Maloney dilation of esophagus ASA CLASS:     Class II INDICATIONS:  Chest pain.   Dysphagia. MEDICATIONS: MAC sedation, administered by CRNA and propofol (Diprivan) 300mg  IV TOPICAL ANESTHETIC:  DESCRIPTION OF PROCEDURE: After the risks benefits and alternatives of the procedure were thoroughly explained, informed consent was obtained.  The    endoscope was introduced through the mouth and advanced to the third portion of the duodenum. Without limitations. The instrument was slowly withdrawn as the mucosa was fully examined.      An early esophageal stricture was present at the GE junction.  The 9 mm scope easily traversed the stricture.   The remainder of the upper endoscopy exam was otherwise normal.  Retroflexed views revealed no abnormalities.     The scope was then withdrawn from the patient.  a #52 [place] dilator was passed with mild resistance. There was no heme.  COMPLICATIONS: There were no complications.  ENDOSCOPIC IMPRESSION: 1.   early esophageal stricture-status post Maloney dilation  RECOMMENDATIONS: 1.  continue Protonix 2.  office visit 4- 6 weeks  REPEAT EXAM:  eSigned:  Louis Meckel, MD 03/13/2013 3:51 PM   CC:

## 2013-03-13 NOTE — Patient Instructions (Addendum)
YOU HAD AN ENDOSCOPIC PROCEDURE TODAY AT THE [facility]: Refer to the procedure report that was given to you for any specific questions about what was found during the examination.  If the procedure report does not answer your questions, please call your gastroenterologist to clarify.  If you requested that your care partner not be given the details of your procedure findings, then the procedure report has been included in a sealed envelope for you to review at your convenience later.  YOU SHOULD EXPECT: Some feelings of bloating in the abdomen. Passage of more gas than usual.  Walking can help get rid of the air that was put into your GI tract during the procedure and reduce the bloating. If you had a lower endoscopy (such as a colonoscopy or flexible sigmoidoscopy) you may notice spotting of blood in your stool or on the toilet paper. If you underwent a bowel prep for your procedure, then you may not have a normal bowel movement for a few days.  DIET: See dilation diet-  Drink plenty of fluids but you should avoid alcoholic beverages for 24 hours.  ACTIVITY: Your care partner should take you home directly after the procedure.  You should plan to take it easy, moving slowly for the rest of the day.  You can resume normal activity the day after the procedure however you should NOT DRIVE or use heavy machinery for 24 hours (because of the sedation medicines used during the test).    SYMPTOMS TO REPORT IMMEDIATELY: A gastroenterologist can be reached at any hour.  During normal business hours, 8:30 AM to 5:00 PM Monday through Friday, call [phone].  After hours and on weekends, please call the GI answering service at [phone] who will take a message and have the physician on call contact you.   Following upper endoscopy (EGD)  Vomiting of blood or coffee ground material  New chest pain or pain under the shoulder blades  Painful or persistently difficult swallowing  New  shortness of breath  Fever of 100F or higher  Black, tarry-looking stools  FOLLOW UP: If any biopsies were taken you will be contacted by phone or by letter within the next 1-3 weeks.  Call your gastroenterologist if you have not heard about the biopsies in 3 weeks.  Our staff will call the home number listed on your records the next business day following your procedure to check on you and address any questions or concerns that you may have at that time regarding the information given to you following your procedure. This is a courtesy call and so if there is no answer at the home number and we have not heard from you through the emergency physician on call, we will assume that you have returned to your regular daily activities without incident.  SIGNATURES/CONFIDENTIALITY: You and/or your care partner have signed paperwork which will be entered into your electronic medical record.  These signatures attest to the fact that that the information above on your After Visit Summary has been reviewed and is understood.  Full responsibility of the confidentiality of this discharge information lies with you and/or your care-partner.  Stricture-handout given  Continue protonix  Office visit in 4-6 weeks, if office has not called by 03/15/13 please call to schedule appointment

## 2013-03-13 NOTE — Progress Notes (Signed)
Patient did not experience any of the following events: a burn prior to discharge; a fall within the facility; wrong site/side/patient/procedure/implant event; or a hospital transfer or hospital admission upon discharge from the facility. (G8907) Patient did not have preoperative order for IV antibiotic SSI prophylaxis. (G8918)  

## 2013-03-14 ENCOUNTER — Telehealth: Payer: Self-pay | Admitting: *Deleted

## 2013-03-14 NOTE — Telephone Encounter (Signed)
  Follow up Call-  Call back number 03/13/2013  Post procedure Call Back phone  # [phone]  Permission to leave phone message Yes     Patient questions:  Do you have a fever, pain , or abdominal swelling? no Pain Score  0 *  Have you tolerated food without any problems? yes  Have you been able to return to your normal activities? yes  Do you have any questions about your discharge instructions: Diet   no Medications  no Follow up visit  no  Do you have questions or concerns about your Care? no  Actions: * If pain score is 4 or above: No action needed, pain <4.

## 2013-03-28 ENCOUNTER — Telehealth: Payer: Self-pay | Admitting: Internal Medicine

## 2013-03-28 DIAGNOSIS — E559 Vitamin D deficiency, unspecified: Secondary | ICD-10-CM | POA: Insufficient documentation

## 2013-03-28 MED ORDER — ERGOCALCIFEROL 1.25 MG (50000 UT) PO CAPS: 50000.0000 [IU] | ORAL_CAPSULE | ORAL | Status: DC

## 2013-03-28 NOTE — Telephone Encounter (Signed)
labs normal,. Including thyroid function and cholesterol  With the following exceptions:  his vitamin D is low, which can increase risk of risk of weak bones and fractures.   i am calling in a megadose of Vit D to take once a week for 2 months,  Then after he finishes that , he should start taking an OTC  Vit D3 supplement 1000 units daily.   Hg ba1c is elevated,  Suggesting he is at increased risk for developing Diabetes.  Will discuss the role of exercise and low glycemic index diet at next visit.

## 2013-03-28 NOTE — Telephone Encounter (Signed)
Patient notified as requested. 

## 2013-03-28 NOTE — Telephone Encounter (Signed)
Left message for patient to call office.  

## 2013-03-31 ENCOUNTER — Other Ambulatory Visit: Payer: Self-pay | Admitting: Internal Medicine

## 2013-04-03 ENCOUNTER — Encounter: Payer: Self-pay | Admitting: Gastroenterology

## 2013-04-03 ENCOUNTER — Ambulatory Visit (INDEPENDENT_AMBULATORY_CARE_PROVIDER_SITE_OTHER): Payer: BC Managed Care – PPO | Admitting: Gastroenterology

## 2013-04-03 VITALS — BP 108/30 | HR 75 | Ht 66.5 in | Wt 206.2 lb

## 2013-04-03 DIAGNOSIS — K219 Gastro-esophageal reflux disease without esophagitis: Secondary | ICD-10-CM

## 2013-04-03 DIAGNOSIS — R1310 Dysphagia, unspecified: Secondary | ICD-10-CM

## 2013-04-03 NOTE — Patient Instructions (Addendum)
Follow up as needed

## 2013-04-03 NOTE — Assessment & Plan Note (Signed)
Asymptomatic following dilatation therapy 

## 2013-04-03 NOTE — Assessment & Plan Note (Signed)
Well-controlled with protonix. Plan to continue with the same with attempts at weaning.

## 2013-04-03 NOTE — Progress Notes (Signed)
History of Present Illness:  The patient has returned following upper endoscopy with dilatation of an early esophageal stricture. He is symptom-free. He has no complaints of dysphagia or pyrosis. He remains on protonix.    Review of Systems: Pertinent positive and negative review of systems were noted in the above HPI section. All other review of systems were otherwise negative.    Current Medications, Allergies, Past Medical History, Past Surgical History, Family History and Social History were reviewed in [employer] electronic medical record  Vital signs were reviewed in today's medical record. Physical Exam: General: Well developed , well nourished, no acute distress

## 2013-04-04 LAB — TSH: TSH: 2.05 u[IU]/mL (ref 0.41–5.90)

## 2013-04-04 LAB — HEPATIC FUNCTION PANEL
ALT: 29 U/L (ref 10–40)
AST: 26 U/L (ref 14–40)
Alkaline Phosphatase: 38 U/L (ref 25–125)

## 2013-04-04 LAB — LIPID PANEL
Cholesterol: 11 mg/dL (ref 0–200)
HDL: 30 mg/dL — AB (ref 35–70)
LDL Cholesterol: 65 mg/dL
TRIGLYCERIDES: 79 mg/dL (ref 40–160)

## 2013-04-04 LAB — BASIC METABOLIC PANEL: CREATININE: 1.4 mg/dL — AB (ref 0.6–1.3)

## 2013-04-04 LAB — HEMOGLOBIN A1C: HEMOGLOBIN A1C: 5.9 % (ref 4.0–6.0)

## 2013-05-15 ENCOUNTER — Other Ambulatory Visit: Payer: Self-pay | Admitting: Internal Medicine

## 2013-05-15 NOTE — Telephone Encounter (Signed)
Pt has taken Vitamin D Rx x 2 months, per 03/28/13 note, pt to take OTC Vitamin D.

## 2013-07-12 ENCOUNTER — Other Ambulatory Visit: Payer: Self-pay | Admitting: Adult Health

## 2013-07-15 ENCOUNTER — Other Ambulatory Visit: Payer: Self-pay | Admitting: Internal Medicine

## 2013-07-18 ENCOUNTER — Other Ambulatory Visit: Payer: Self-pay | Admitting: *Deleted

## 2013-07-18 MED ORDER — PANTOPRAZOLE SODIUM 40 MG PO TBEC: DELAYED_RELEASE_TABLET | ORAL | Status: DC

## 2013-07-18 NOTE — Telephone Encounter (Signed)
Eprescribed.

## 2013-07-19 MED ORDER — PANTOPRAZOLE SODIUM 40 MG PO TBEC: DELAYED_RELEASE_TABLET | ORAL | Status: DC

## 2013-07-19 NOTE — Telephone Encounter (Signed)
Script filled patient notified

## 2013-08-15 ENCOUNTER — Other Ambulatory Visit: Payer: Self-pay | Admitting: Internal Medicine

## 2013-09-13 ENCOUNTER — Other Ambulatory Visit: Payer: Self-pay

## 2013-09-14 ENCOUNTER — Other Ambulatory Visit: Payer: Self-pay | Admitting: Internal Medicine

## 2013-12-14 ENCOUNTER — Other Ambulatory Visit: Payer: Self-pay | Admitting: Internal Medicine

## 2013-12-14 NOTE — Telephone Encounter (Signed)
Refill one 30 days only.  Has not been seen in one year so he needs a  30 minute visit.  

## 2013-12-14 NOTE — Telephone Encounter (Signed)
Last non acute visit 04/07/12, refill or appt first?

## 2013-12-17 NOTE — Telephone Encounter (Signed)
Left message, notifying pt of need for appointment and to call office to schedule prior to next refill.

## 2014-01-14 ENCOUNTER — Telehealth: Payer: Self-pay | Admitting: *Deleted

## 2014-01-14 ENCOUNTER — Other Ambulatory Visit: Payer: Self-pay | Admitting: Adult Health

## 2014-01-14 ENCOUNTER — Encounter: Payer: Self-pay | Admitting: Adult Health

## 2014-01-14 ENCOUNTER — Ambulatory Visit (INDEPENDENT_AMBULATORY_CARE_PROVIDER_SITE_OTHER): Payer: BC Managed Care – PPO | Admitting: Adult Health

## 2014-01-14 VITALS — BP 110/78 | HR 80 | Temp 98.3°F | Resp 12 | Wt 213.0 lb

## 2014-01-14 DIAGNOSIS — K1121 Acute sialoadenitis: Secondary | ICD-10-CM

## 2014-01-14 DIAGNOSIS — K1120 Sialoadenitis, unspecified: Secondary | ICD-10-CM

## 2014-01-14 DIAGNOSIS — R509 Fever, unspecified: Secondary | ICD-10-CM | POA: Insufficient documentation

## 2014-01-14 LAB — POCT INFLUENZA A/B
INFLUENZA A, POC: NEGATIVE
Influenza B, POC: NEGATIVE

## 2014-01-14 LAB — CBC WITH DIFFERENTIAL/PLATELET
Basophils Absolute: 0 10*3/uL (ref 0.0–0.1)
Basophils Relative: 0.6 % (ref 0.0–3.0)
EOS PCT: 3.9 % (ref 0.0–5.0)
Eosinophils Absolute: 0.2 10*3/uL (ref 0.0–0.7)
HCT: 43.6 % (ref 39.0–52.0)
Hemoglobin: 14.4 g/dL (ref 13.0–17.0)
Lymphocytes Relative: 40.4 % (ref 12.0–46.0)
Lymphs Abs: 1.7 10*3/uL (ref 0.7–4.0)
MCHC: 33.1 g/dL (ref 30.0–36.0)
MCV: 90.2 fl (ref 78.0–100.0)
Monocytes Absolute: 0.7 10*3/uL (ref 0.1–1.0)
NEUTROS PCT: 37.1 % — AB (ref 43.0–77.0)
Neutro Abs: 1.5 10*3/uL (ref 1.4–7.7)
Platelets: 240 10*3/uL (ref 150.0–400.0)
RBC: 4.84 Mil/uL (ref 4.22–5.81)
RDW: 13.5 % (ref 11.5–14.6)

## 2014-01-14 LAB — POCT MONO (EPSTEIN BARR VIRUS): MONO, POC: NEGATIVE

## 2014-01-14 LAB — POCT RAPID STREP A (OFFICE): RAPID STREP A SCREEN: NEGATIVE

## 2014-01-14 MED ORDER — ENALAPRIL-HYDROCHLOROTHIAZIDE 10-25 MG PO TABS: 1.0000 | ORAL_TABLET | Freq: Every day | ORAL | Status: DC

## 2014-01-14 NOTE — Progress Notes (Signed)
Patient ID: Christopher Vazquez, male   DOB: 01-30-1966, 48 y.o.   MRN: [id number]    Subjective:    Patient ID: Christopher Vazquez, male    DOB: [birth year], 48 y.o.   MRN: [id number]  HPI  Pt is a pleasant 48 y/o male who presents to clinic with reports of having URI 1 week ago. He was seen by the Nurse Practitioner at his job who started him on Augmentin. Today will be his last dose. He reports swollen lymph nodes, sore throat and feeling extremely tired. He had a cough last week but this has resolved. Ongoing fever. The highest reported was 101. Last night he reported fever of 100.    Past Medical History  Diagnosis Date  . Hyperlipidemia   . Hypertension   . GERD (gastroesophageal reflux disease)     Current Outpatient Prescriptions on File Prior to Visit  Medication Sig Dispense Refill  . aspirin 81 MG tablet Take 81 mg by mouth daily.       . enalapril-hydrochlorothiazide (VASERETIC) 10-25 MG per tablet TAKE 1 TABLET BY MOUTH DAILY.  30 tablet  0  . ergocalciferol (DRISDOL) 50000 UNITS capsule Take 1 capsule (50,000 Units total) by mouth once a week.  4 capsule  1  . fenofibrate 160 MG tablet Take 1 tablet (160 mg total) by mouth daily.  90 tablet  2  . pantoprazole (PROTONIX) 40 MG tablet TAKE 1 TABLET (40 MG TOTAL) BY MOUTH DAILY.  30 tablet  1  . pantoprazole (PROTONIX) 40 MG tablet Take 1 tablet (40 mg total) by mouth daily. NEEDS TO BE SEEN FOR ADDITIONAL REFILLS  30 tablet  0  . simvastatin (ZOCOR) 20 MG tablet TAKE 1 TABLET AT BEDTIME  90 tablet  3   No current facility-administered medications on file prior to visit.     Review of Systems  Constitutional: Positive for fever and fatigue.  HENT: Positive for facial swelling (right side of face swollen), sore throat and voice change. Negative for congestion.   Respiratory: Negative for cough.   Cardiovascular: Negative.   Genitourinary: Negative for testicular pain (no orhitis).  All other systems reviewed and are  negative.       Objective:  BP 110/78  Pulse 80  Temp(Src) 98.3 F (36.8 C) (Oral)  Resp 12  Wt 213 lb (96.616 kg)  SpO2 97%   Physical Exam  Constitutional: He is oriented to person, place, and time. He appears well-developed and well-nourished.  Acutely ill  HENT:  Head: Normocephalic and atraumatic.  Right Ear: External ear normal.  Left Ear: External ear normal.  Right parotitis  Neck: Normal range of motion.  Cardiovascular: Normal rate and regular rhythm.   Pulmonary/Chest: No respiratory distress.  Lymphadenopathy:    He has cervical adenopathy.  Neurological: He is alert and oriented to person, place, and time.  Psychiatric: He has a normal mood and affect. His behavior is normal. Judgment and thought content normal.       Assessment & Plan:   1. Fever, unspecified Strep negative; influenza negative and mono spot negative. Check CBC and check for mumps.  - POCT rapid strep A - POCT Influenza A/B - CBC with Differential - Mumps antibody, IgM - POCT Mono (Epstein Barr Virus)  2. Parotitis, acute Acute episode which started yesterday. Mumps vs. Parotid gland stone. Negative for influenza, strep and mono.  - CBC with Differential - Mumps antibody, IgM - POCT Mono (Epstein Barr Virus)

## 2014-01-14 NOTE — Patient Instructions (Signed)
  Test for influenza, strep and mono were all negative.  I am testing you for mumps.  I will call you with the results once they are available which may be 2-3 days.  Take ibuprofen 400 mg - 600 mg every 6 hours as needed for fever or general discomfort.  Drink fluids to stay hydrated.  Call if symptoms worsen.

## 2014-01-14 NOTE — Progress Notes (Signed)
Pre visit review using our clinic review tool, if applicable. No additional management support is needed unless otherwise documented below in the visit note. 

## 2014-01-14 NOTE — Telephone Encounter (Signed)
Left message on vm advising pt that R. Rey sent an Rx for BP medication but he will need to call office to schedule a follow up appt with Dr Derrel Nip for any future Rx refills

## 2014-01-16 ENCOUNTER — Telehealth: Payer: Self-pay | Admitting: Adult Health

## 2014-01-16 NOTE — Telephone Encounter (Signed)
The patient is calling for lab results

## 2014-01-17 ENCOUNTER — Encounter: Payer: Self-pay | Admitting: Adult Health

## 2014-01-17 ENCOUNTER — Other Ambulatory Visit: Payer: Self-pay | Admitting: Adult Health

## 2014-01-17 DIAGNOSIS — R899 Unspecified abnormal finding in specimens from other organs, systems and tissues: Secondary | ICD-10-CM

## 2014-01-17 LAB — MUMPS ANTIBODY, IGM

## 2014-01-17 NOTE — Telephone Encounter (Signed)
Raquel sent a MyChart message to pt

## 2014-01-18 ENCOUNTER — Encounter: Payer: Self-pay | Admitting: Adult Health

## 2014-01-19 ENCOUNTER — Encounter: Payer: Self-pay | Admitting: Adult Health

## 2014-01-19 ENCOUNTER — Other Ambulatory Visit: Payer: Self-pay | Admitting: Adult Health

## 2014-01-19 DIAGNOSIS — K1120 Sialoadenitis, unspecified: Secondary | ICD-10-CM

## 2014-01-19 MED ORDER — CEPHALEXIN 500 MG PO CAPS: 500.0000 mg | ORAL_CAPSULE | Freq: Two times a day (BID) | ORAL | Status: DC

## 2014-01-30 ENCOUNTER — Ambulatory Visit (INDEPENDENT_AMBULATORY_CARE_PROVIDER_SITE_OTHER): Payer: BC Managed Care – PPO | Admitting: Internal Medicine

## 2014-01-30 ENCOUNTER — Encounter: Payer: Self-pay | Admitting: Internal Medicine

## 2014-01-30 VITALS — BP 120/78 | HR 75 | Temp 98.2°F | Resp 16 | Wt 213.0 lb

## 2014-01-30 DIAGNOSIS — J309 Allergic rhinitis, unspecified: Secondary | ICD-10-CM

## 2014-01-30 DIAGNOSIS — J302 Other seasonal allergic rhinitis: Secondary | ICD-10-CM

## 2014-01-30 DIAGNOSIS — I10 Essential (primary) hypertension: Secondary | ICD-10-CM

## 2014-01-30 DIAGNOSIS — E785 Hyperlipidemia, unspecified: Secondary | ICD-10-CM

## 2014-01-30 DIAGNOSIS — K1121 Acute sialoadenitis: Secondary | ICD-10-CM

## 2014-01-30 DIAGNOSIS — K1120 Sialoadenitis, unspecified: Secondary | ICD-10-CM

## 2014-01-30 MED ORDER — ENALAPRIL-HYDROCHLOROTHIAZIDE 10-25 MG PO TABS: 1.0000 | ORAL_TABLET | Freq: Every day | ORAL | Status: DC

## 2014-01-30 NOTE — Progress Notes (Signed)
Patient ID: Christopher Vazquez, male   DOB: [birth year], 48 y.o.   MRN: [id number]   Patient Active Problem List   Diagnosis Date Noted  . Seasonal allergic rhinitis 02/02/2014  . Fever, unspecified 01/14/2014  . Parotitis, acute 01/14/2014  . Unspecified vitamin D deficiency 03/28/2013  . Dysphagia, unspecified(787.20) 03/07/2013  . Hepatic steatosis 03/07/2013  . RUQ pain 02/06/2013  . MRSA cellulitis 01/09/2012  . Polyarthritis 01/07/2012  . Mass of abdomen 11/14/2011  . Fatigue 11/14/2011  . Hyperlipidemia   . Hypertension     Subjective:  CC:   Chief Complaint  Patient presents with  . Follow-up    2 week fever and swollen glands    HPI:   Christopher Vazquez is a 48 y.o. male who presents for  Follow up on multiple issues.  He is recoverinng from a recent episode of  parotiditis  Vs other infection which lasted two weeks ago despite empiric treatment with augmentin initially by the NP at work,  Followed by evaluation one week later by Raquel with negative influenza and strep tests.  He was given a second course of antibiotics and his symptoms have resolved after asecond course of abx.    Has seasonal rhinitis.  He used to use flonase for management and is requesting a refill.    Past Medical History  Diagnosis Date  . Hyperlipidemia   . Hypertension   . GERD (gastroesophageal reflux disease)     Past Surgical History  Procedure Laterality Date  . Tonsillectomy and adenoidectomy    . Hernia repair         The following portions of the patient's history were reviewed and updated as appropriate: Allergies, current medications, and problem list.    Review of Systems:   Patient denies headache, fevers, malaise, unintentional weight loss, skin rash, eye pain, sinus congestion and sinus pain, sore throat, dysphagia,  hemoptysis , cough, dyspnea, wheezing, chest pain, palpitations, orthopnea, edema, abdominal pain, nausea, melena, diarrhea, constipation, flank pain,  dysuria, hematuria, urinary  Frequency, nocturia, numbness, tingling, seizures,  Focal weakness, Loss of consciousness,  Tremor, insomnia, depression, anxiety, and suicidal ideation.     History   Social History  . Marital Status: Married    Spouse Name: N/A    Number of Children: 3  . 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 Smoking status: Never Smoker   . Smokeless tobacco: Never Used  . Alcohol Use: No  . Drug Use: Not on file  . Sexual Activity: Not on file   Other Topics Concern  . Not on file   Social History Narrative  . No narrative on file    Objective:  Filed Vitals:   01/30/14 1454  BP: 120/78  Pulse: 75  Temp: 98.2 F (36.8 C)  Resp: 16     General appearance: alert, cooperative and appears stated age Ears: normal TM's and external ear canals both ears Throat: lips, mucosa, and tongue normal; teeth and gums normal Neck: no adenopathy, no carotid bruit, supple, symmetrical, trachea midline and thyroid not enlarged, symmetric, no tenderness/mass/nodules Back: symmetric, no curvature. ROM normal. No CVA tenderness. Lungs: clear to auscultation bilaterally Heart: regular rate and rhythm, S1, S2 normal, no murmur, click, rub or gallop Abdomen: soft, non-tender; bowel sounds normal; no masses,  no organomegaly Pulses: 2+ and symmetric Skin: Skin color, texture, turgor normal. No rashes or lesions Lymph nodes: Cervical, supraclavicular, and axillary  nodes normal.  Assessment and Plan:  Parotitis, acute Resolved after second round of antibiotics.   Seasonal allergic rhinitis Discussed resuming flomax   Hypertension Well controlled on current regimen. Renal function due, no changes today.  Hyperlipidemia Managed with simvastatin .  he is due for fasting labs    Updated Medication List Outpatient Encounter Prescriptions as of 01/30/2014  Medication Sig  . aspirin 81 MG tablet Take 81  mg by mouth daily.   . enalapril-hydrochlorothiazide (VASERETIC) 10-25 MG per tablet Take 1 tablet by mouth daily.  . fenofibrate 160 MG tablet Take 1 tablet (160 mg total) by mouth daily.  . pantoprazole (PROTONIX) 40 MG tablet TAKE 1 TABLET (40 MG TOTAL) BY MOUTH DAILY.  . pantoprazole (PROTONIX) 40 MG tablet Take 1 tablet (40 mg total) by mouth daily. NEEDS TO BE SEEN FOR ADDITIONAL REFILLS  . simvastatin (ZOCOR) 20 MG tablet TAKE 1 TABLET AT BEDTIME  . [DISCONTINUED] enalapril-hydrochlorothiazide (VASERETIC) 10-25 MG per tablet Take 1 tablet by mouth daily.  . cephALEXin (KEFLEX) 500 MG capsule Take 1 capsule (500 mg total) by mouth 2 (two) times daily.  . ergocalciferol (DRISDOL) 50000 UNITS capsule Take 1 capsule (50,000 Units total) by mouth once a week.

## 2014-02-02 DIAGNOSIS — J302 Other seasonal allergic rhinitis: Secondary | ICD-10-CM | POA: Insufficient documentation

## 2014-02-02 NOTE — Assessment & Plan Note (Signed)
Well controlled on current regimen. Renal function due.,   no changes today. 

## 2014-02-02 NOTE — Assessment & Plan Note (Signed)
Managed with simvastatin .  he is due for fasting labs

## 2014-02-02 NOTE — Assessment & Plan Note (Signed)
Resolved after second round of antibiotics.

## 2014-02-02 NOTE — Assessment & Plan Note (Signed)
Discussed resuming flomax

## 2014-02-15 LAB — HEPATIC FUNCTION PANEL
ALK PHOS: 43 U/L (ref 25–125)
ALT: 42 U/L — AB (ref 10–40)
AST: 33 U/L (ref 14–40)
Bilirubin, Total: 0.3 mg/dL

## 2014-02-15 LAB — CBC AND DIFFERENTIAL
Hemoglobin: 13.8 g/dL (ref 13.5–17.5)
PLATELETS: 224 10*3/uL (ref 150–399)
WBC: 5.7 10^3/mL

## 2014-02-15 LAB — BASIC METABOLIC PANEL
BUN: 17 mg/dL (ref 4–21)
Creatinine: 1.2 mg/dL (ref 0.6–1.3)

## 2014-02-15 LAB — LIPID PANEL
CHOLESTEROL: 121 mg/dL (ref 0–200)
HDL: 30 mg/dL — AB (ref 35–70)
LDL Cholesterol: 61 mg/dL
Triglycerides: 150 mg/dL (ref 40–160)

## 2014-02-15 LAB — HEMOGLOBIN A1C: Hgb A1c MFr Bld: 6 % (ref 4.0–6.0)

## 2014-02-15 LAB — TSH: TSH: 2.9 u[IU]/mL (ref 0.41–5.90)

## 2014-02-19 ENCOUNTER — Telehealth: Payer: Self-pay | Admitting: Internal Medicine

## 2014-02-19 DIAGNOSIS — E559 Vitamin D deficiency, unspecified: Secondary | ICD-10-CM | POA: Insufficient documentation

## 2014-02-19 MED ORDER — ERGOCALCIFEROL 1.25 MG (50000 UT) PO CAPS: 50000.0000 [IU] | ORAL_CAPSULE | ORAL | Status: DC

## 2014-02-19 NOTE — Telephone Encounter (Signed)
Sent mychart message

## 2014-02-19 NOTE — Telephone Encounter (Signed)
Your cholesterol, PSA liver and kidney function are normal. Your vitamin D is low, which can increase your risk of weak bones and fractures and interfere with your body's ability to absorb the calcium in your diet.   I am calling in a megadose of Vit D to take once weekly for a total of 3 months,  Then after you finish the weekly supplement, you should start taking an OTC  Vit D3 supplement 1000 units daily.   Regards,  Dr. Derrel Nip

## 2014-03-13 ENCOUNTER — Encounter: Payer: Self-pay | Admitting: Internal Medicine

## 2014-03-13 DIAGNOSIS — R1900 Intra-abdominal and pelvic swelling, mass and lump, unspecified site: Secondary | ICD-10-CM

## 2014-03-13 MED ORDER — ERGOCALCIFEROL 1.25 MG (50000 UT) PO CAPS: 50000.0000 [IU] | ORAL_CAPSULE | ORAL | Status: DC

## 2014-03-13 NOTE — Addendum Note (Signed)
Addended by: Wynonia Lawman E on: 03/13/2014 08:02 AM   Modules accepted: Orders

## 2014-03-13 NOTE — Telephone Encounter (Signed)
Advised pt refills available at pharmacy for enalapril-hctz, completed 01/30/14. Please see rest of message for Dr. Derrel Nip

## 2014-03-15 NOTE — Telephone Encounter (Signed)
Routed again to Dr. Derrel Nip

## 2014-03-17 MED ORDER — TADALAFIL 20 MG PO TABS: 10.0000 mg | ORAL_TABLET | ORAL | Status: DC | PRN

## 2014-03-17 NOTE — Telephone Encounter (Signed)
Mr Dubin,  I have sent an rx for Cialis to your pharmacy,  Please read the information that comes with it carefully before using it. Alternatively, You can make an appt to discuss.   Regards,  Dr. Derrel Nip

## 2014-03-18 ENCOUNTER — Other Ambulatory Visit: Payer: Self-pay | Admitting: Adult Health

## 2014-05-01 ENCOUNTER — Other Ambulatory Visit: Payer: Self-pay | Admitting: Internal Medicine

## 2014-05-07 ENCOUNTER — Other Ambulatory Visit: Payer: Self-pay | Admitting: Internal Medicine

## 2014-06-14 IMAGING — US ABDOMEN ULTRASOUND LIMITED
1 series · 14 of 25 positions shown · non-contrast
Comparison: none

REASON FOR EXAM: Call [id number]  RUQ pain  gallbladder
COMMENTS:

[Series 1: abdomen ultrasound limited · 0.38mm/px · 14 of 58 slices shown]
[im 1/58]
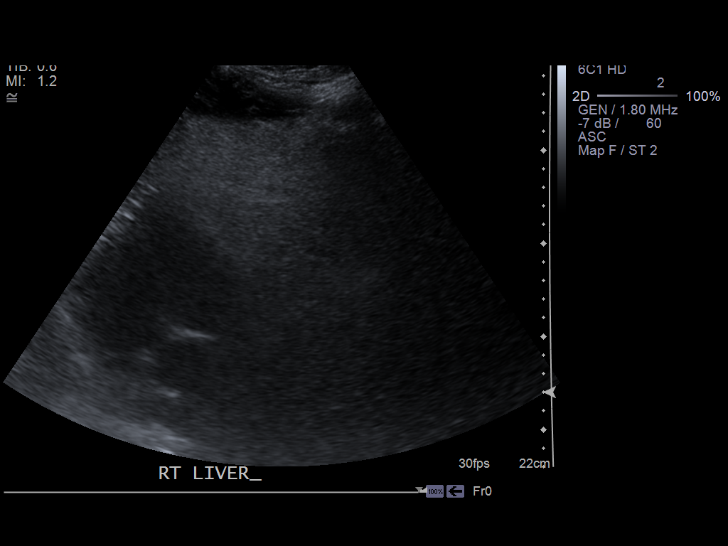
[im 5/58]
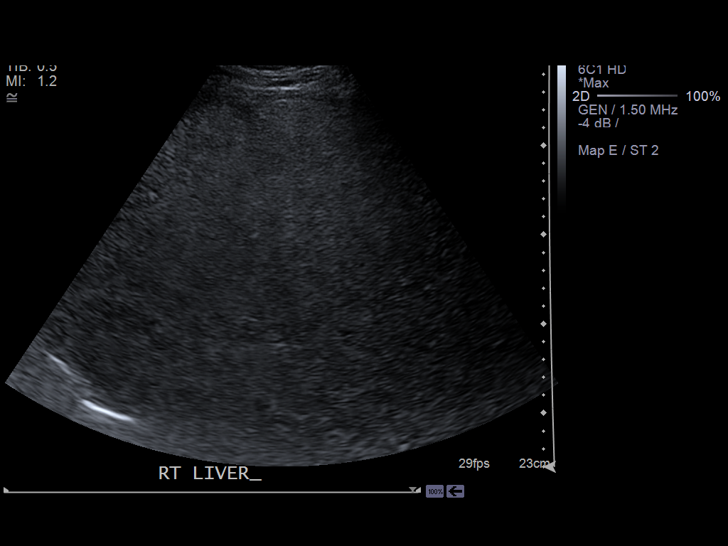
[im 10/58]
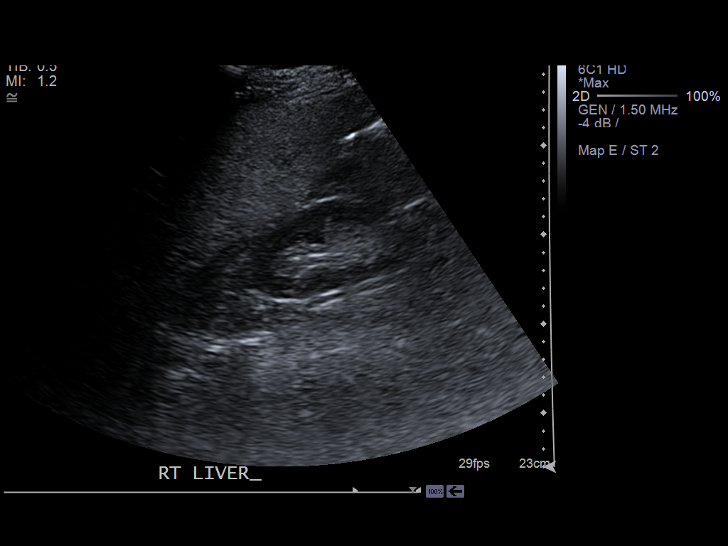
[im 15/58]
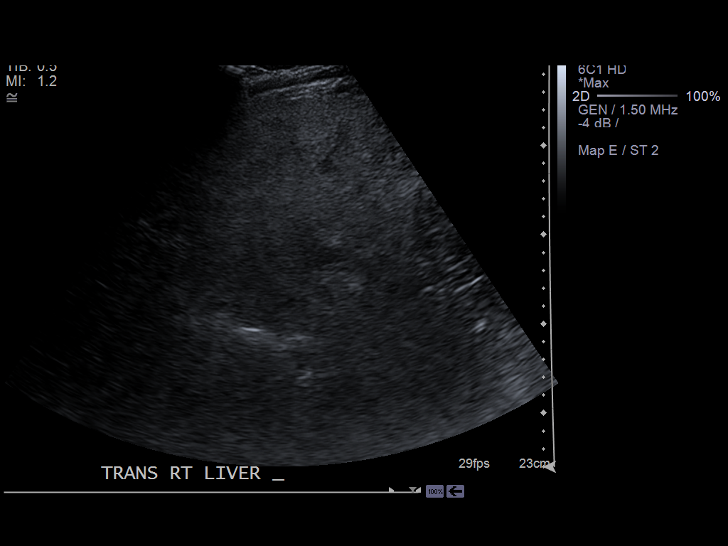
[im 20/58]
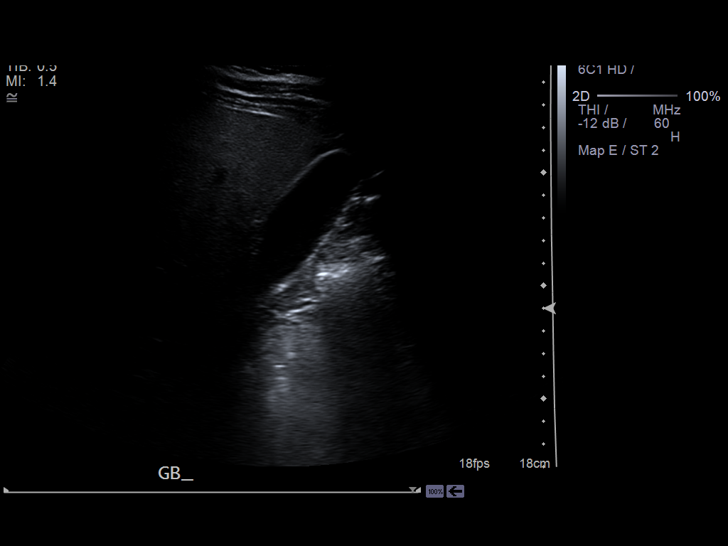
[im 22/58]
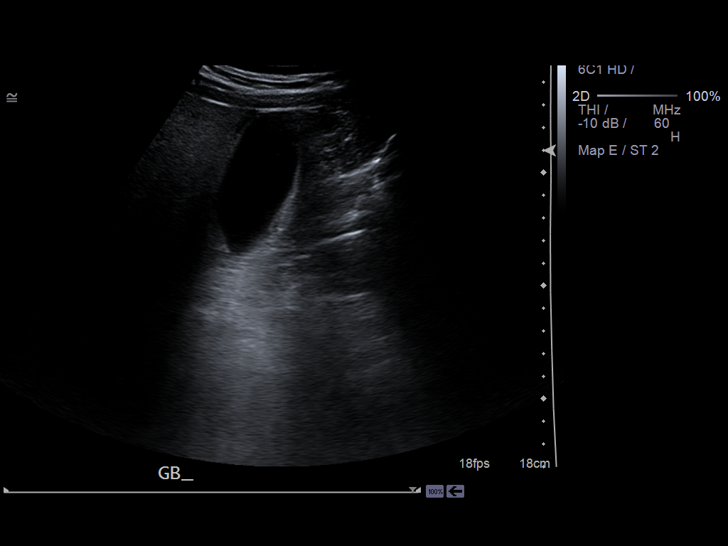
[im 27/58]
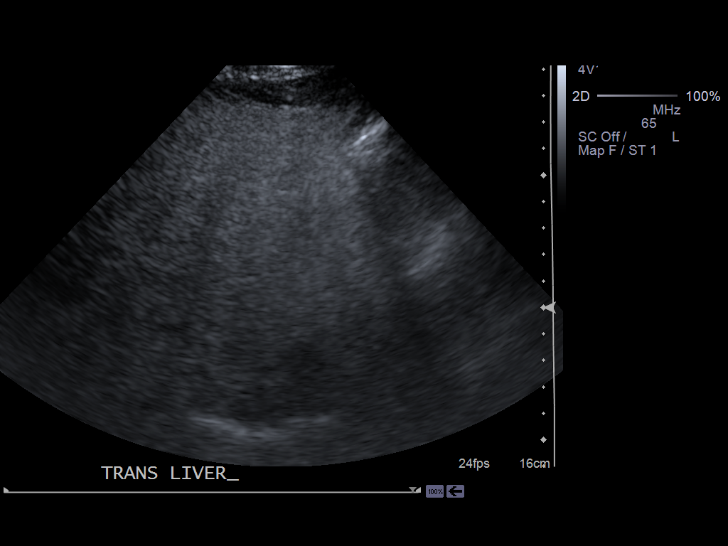
[im 31/58]
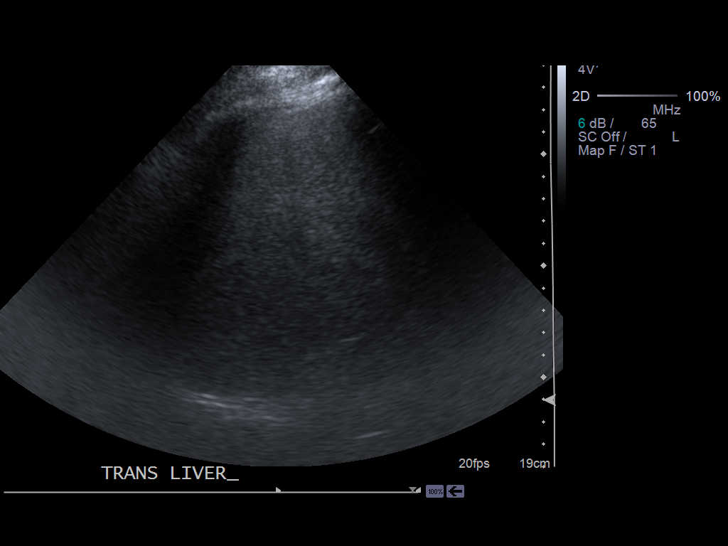
[im 36/58]
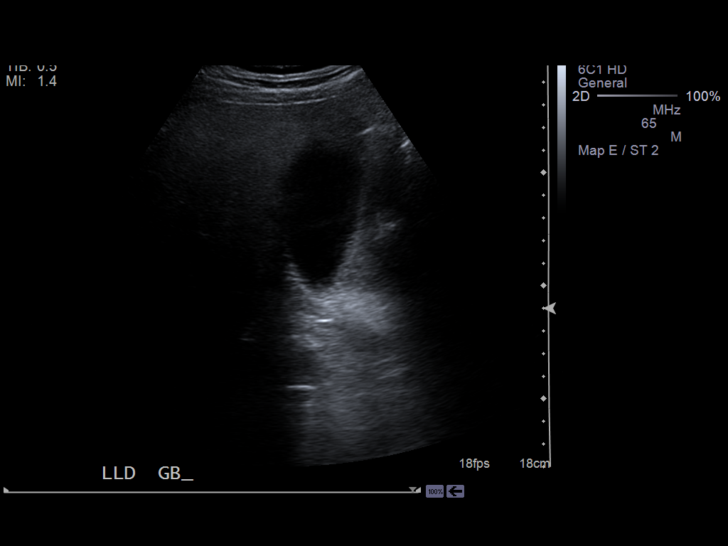
[im 39/58]
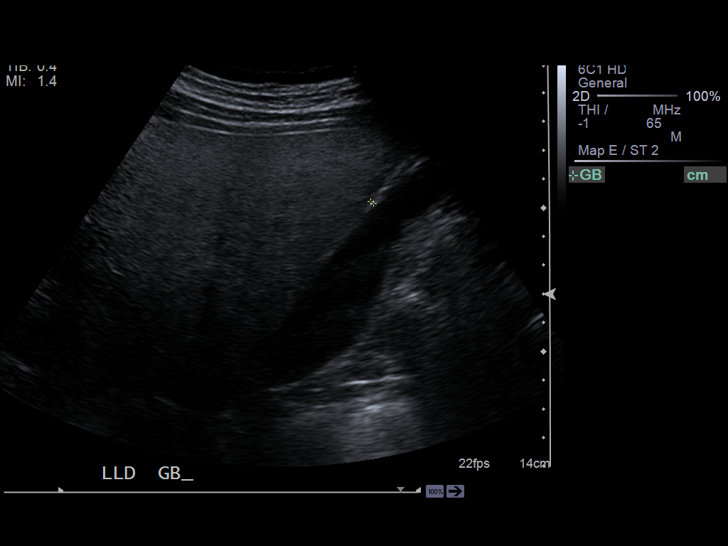
[im 43/58]
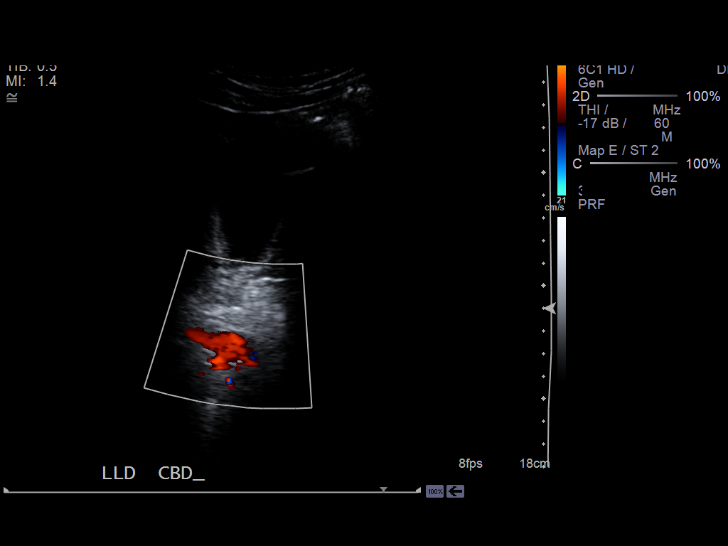
[im 48/58]
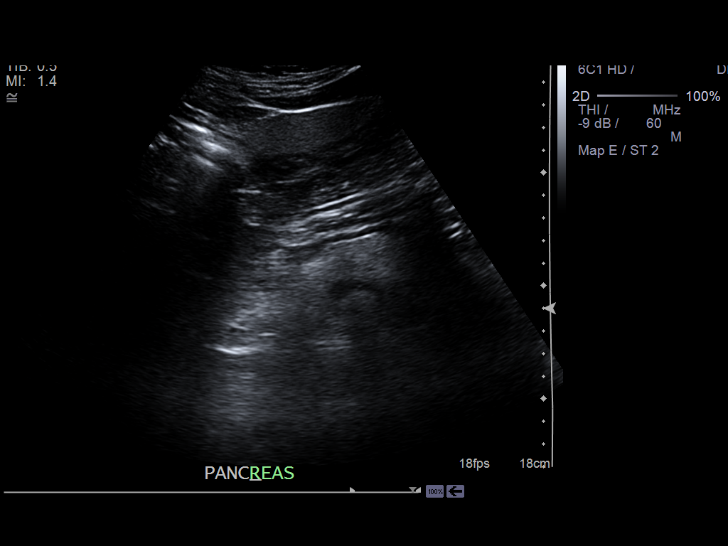
[im 53/58]
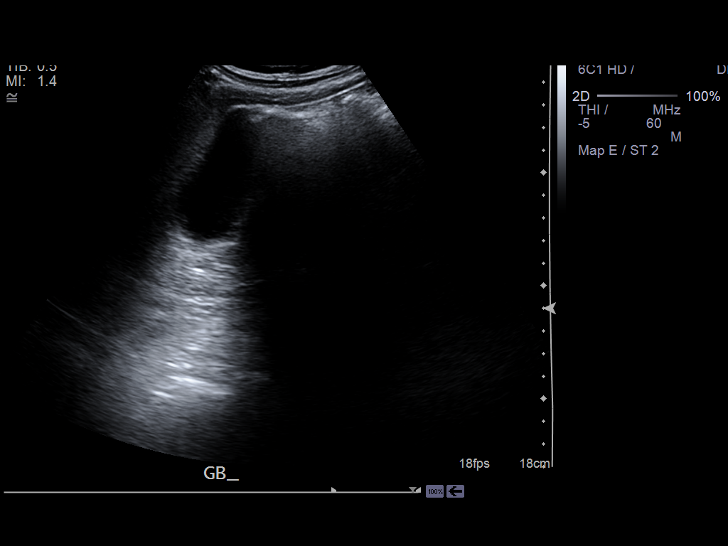
[im 58/58]
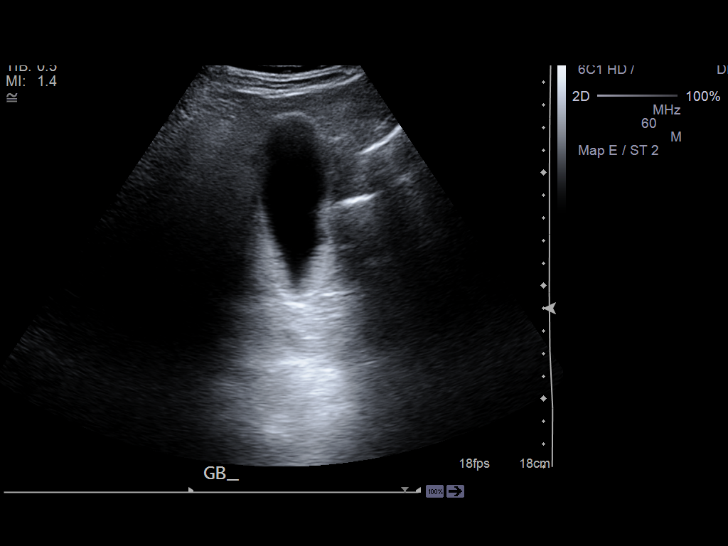

[14 of 25 positions shown; findings below may reference images not displayed]

PROCEDURE:     US  - US ABDOMEN LIMITED SURVEY  - February 06, 2013  [DATE]

RESULT:     A limited right upper quadrant ultrasound was performed.

The liver exhibits increased echotexture and difficulty was noted with
penetration by the ultrasound beam. This likely reflects fatty infiltration.
No focal mass or ductal dilation is demonstrated. Portal venous flow is
normal in direction toward the liver. The pancreatic head and body were
visualized but the tail was obscured by bowel gas. The gallbladder is
adequately distended with no evidence of stones, wall thickening, or
pericholecystic fluid. There is no positive sonographic Murphy's sign. The
common bile duct measures 3.5 mm in diameter.
IMPRESSION: 1. No gallstones are evident.
2. The observed portions of the pancreas appear normal.
3. The liver demonstrates changes consistent with fatty infiltration.

A preliminary report was called to Dr. [REDACTED] at the conclusion of
the study.

[REDACTED]

## 2014-09-13 ENCOUNTER — Other Ambulatory Visit: Payer: Self-pay | Admitting: Internal Medicine

## 2015-01-04 ENCOUNTER — Other Ambulatory Visit: Payer: Self-pay | Admitting: Internal Medicine

## 2015-01-28 LAB — CBC AND DIFFERENTIAL
HCT: 42 % (ref 41–53)
HEMOGLOBIN: 14.2 g/dL (ref 13.5–17.5)
Neutrophils Absolute: 2 /uL
Platelets: 270 10*3/uL (ref 150–399)
WBC: 5.4 10*3/mL

## 2015-01-28 LAB — BASIC METABOLIC PANEL
BUN: 15 mg/dL (ref 4–21)
Creatinine: 1.1 mg/dL (ref 0.6–1.3)
Glucose: 89 mg/dL
POTASSIUM: 4.1 mmol/L (ref 3.4–5.3)
SODIUM: 143 mmol/L (ref 137–147)

## 2015-01-28 LAB — HEPATIC FUNCTION PANEL
ALT: 35 U/L (ref 10–40)
AST: 28 U/L (ref 14–40)
Alkaline Phosphatase: 44 U/L (ref 25–125)
BILIRUBIN, TOTAL: 0.2 mg/dL

## 2015-01-28 LAB — LIPID PANEL
Cholesterol: 121 mg/dL (ref 0–200)
HDL: 34 mg/dL — AB (ref 35–70)
LDL CALC: 67 mg/dL
Triglycerides: 99 mg/dL (ref 40–160)

## 2015-01-28 LAB — TSH: TSH: 1.92 u[IU]/mL (ref 0.41–5.90)

## 2015-01-28 LAB — HEMOGLOBIN A1C: Hgb A1c MFr Bld: 6.1 % — AB (ref 4.0–6.0)

## 2015-02-02 ENCOUNTER — Other Ambulatory Visit: Payer: Self-pay | Admitting: Internal Medicine

## 2015-02-04 ENCOUNTER — Telehealth: Payer: Self-pay | Admitting: Internal Medicine

## 2015-02-04 MED ORDER — VITAMIN D (ERGOCALCIFEROL) 1.25 MG (50000 UNIT) PO CAPS: 50000.0000 [IU] | ORAL_CAPSULE | ORAL | Status: DC

## 2015-02-04 NOTE — Telephone Encounter (Signed)
Left message for patient to return call to office. 

## 2015-02-04 NOTE — Telephone Encounter (Signed)
Vit D is very  low again,   And A1c suggests patient is at risk for developing type 2 DM,  Will discuss at patient's next visit   Will send Drisdol to pharmacy

## 2015-02-07 ENCOUNTER — Ambulatory Visit (INDEPENDENT_AMBULATORY_CARE_PROVIDER_SITE_OTHER): Payer: Self-pay | Admitting: Internal Medicine

## 2015-02-07 ENCOUNTER — Encounter: Payer: Self-pay | Admitting: Internal Medicine

## 2015-02-07 VITALS — BP 122/72 | HR 83 | Temp 98.6°F | Resp 16 | Ht 67.0 in | Wt 209.2 lb

## 2015-02-07 DIAGNOSIS — K760 Fatty (change of) liver, not elsewhere classified: Secondary | ICD-10-CM

## 2015-02-07 DIAGNOSIS — E785 Hyperlipidemia, unspecified: Secondary | ICD-10-CM

## 2015-02-07 DIAGNOSIS — R1900 Intra-abdominal and pelvic swelling, mass and lump, unspecified site: Secondary | ICD-10-CM

## 2015-02-07 DIAGNOSIS — E669 Obesity, unspecified: Secondary | ICD-10-CM

## 2015-02-07 DIAGNOSIS — I10 Essential (primary) hypertension: Secondary | ICD-10-CM

## 2015-02-07 MED ORDER — ONDANSETRON 8 MG PO TBDP: 8.0000 mg | ORAL_TABLET | Freq: Three times a day (TID) | ORAL | Status: DC | PRN

## 2015-02-07 MED ORDER — CIPROFLOXACIN HCL 500 MG PO TABS: 250.0000 mg | ORAL_TABLET | Freq: Two times a day (BID) | ORAL | Status: DC

## 2015-02-07 NOTE — Telephone Encounter (Signed)
Patient aware.

## 2015-02-07 NOTE — Progress Notes (Signed)
Patient ID: Christopher Vazquez, male   DOB: 1966/10/16, 49 y.o.   MRN: [id number]  Patient Active Problem List   Diagnosis Date Noted  . Obesity 02/09/2015  . Vitamin D deficiency 02/19/2014  . Seasonal allergic rhinitis 02/02/2014  . Parotitis, acute 01/14/2014  . Unspecified vitamin D deficiency 03/28/2013  . Dysphagia, unspecified(787.20) 03/07/2013  . Hepatic steatosis 03/07/2013  . Polyarthritis 01/07/2012  . Fatigue 11/14/2011  . Hyperlipidemia   . Essential hypertension     Subjective:  CC:   Chief Complaint  Patient presents with  . Follow-up    labs    HPI:   Christopher Vazquez is a 49 y.o. male who presents for   Follow up on obesity, impaired fasting glucose, hypertension and hyperlipidemia. He has been intermittenlty checking his blood sugars at [place] using his wife's glucometer, and has noted post prandials to 170 and fastings  Of 90   He feels generally well,  And tTaking his medications as directed. Following a carbohydrate modified diet 6 days per week. Denies numbness, burning and tingling of extremities. Appetite is good.   He and his wife have recently starting a walking program    Past Medical History  Diagnosis Date  . Hyperlipidemia   . Hypertension   . GERD (gastroesophageal reflux disease)     Past Surgical History  Procedure Laterality Date  . Tonsillectomy and adenoidectomy    . Hernia repair         The following portions of the patient's history were reviewed and updated as appropriate: Allergies, current medications, and problem list.    Review of Systems:   Patient denies headache, fevers, malaise, unintentional weight loss, skin rash, eye pain, sinus congestion and sinus pain, sore throat, dysphagia,  hemoptysis , cough, dyspnea, wheezing, chest pain, palpitations, orthopnea, edema, abdominal pain, nausea, melena, diarrhea, constipation, flank pain, dysuria, hematuria, urinary  Frequency, nocturia, numbness, tingling, seizures,  Focal  weakness, Loss of consciousness,  Tremor, insomnia, depression, anxiety, and suicidal ideation.     History   Social History  . Marital Status: Married    Spouse Name: N/A  . Number of Children: 3  . 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 Smokeless tobacco: Never Used  . Alcohol Use: No  . Drug Use: Not on file  . Sexual Activity: Not on file   Other Topics Concern  . Not on file   Social History Narrative    Objective:  Filed Vitals:   02/07/15 1633  BP: 122/72  Pulse: 83  Temp: 98.6 F (37 C)  Resp: 16     General appearance: alert, cooperative and appears stated age Ears: normal TM's and external ear canals both ears Throat: lips, mucosa, and tongue normal; teeth and gums normal Neck: no adenopathy, no carotid bruit, supple, symmetrical, trachea midline and thyroid not enlarged, symmetric, no tenderness/mass/nodules Back: symmetric, no curvature. ROM normal. No CVA tenderness. Lungs: clear to auscultation bilaterally Heart: regular rate and rhythm, S1, S2 normal, no murmur, click, rub or gallop Abdomen: soft, non-tender; bowel sounds normal; no masses,  no organomegaly Pulses: 2+ and symmetric Skin: Skin color, texture, turgor normal. No rashes or lesions Lymph nodes: Cervical, supraclavicular, and axillary nodes normal.  Assessment and Plan:  Hyperlipidemia Managed with simvastatin  And fenofibrate.  LDL and triglycerides are at goal on current medications. He has no side effects  and liver enzymes are normal. No changes today    Lab Results  Component Value Date   CHOL 121 01/28/2015   HDL 34* 01/28/2015   LDLCALC 67 01/28/2015   TRIG 99 01/28/2015    Lab Results  Component Value Date   ALT 35 01/28/2015   AST 28 01/28/2015   ALKPHOS 44 01/28/2015       Essential hypertension Well controlled on current regimen. Renal function stable, no  changes today.  Lab Results  Component Value Date   CREATININE 1.1 01/28/2015   Lab Results  Component Value Date   NA 143 01/28/2015   K 4.1 01/28/2015      Hepatic steatosis Hepatic enzymes are normalized and he has no signs of cirrhosis or synthetic dysfunction .  continue low glycemic index diet, renew efforts to get  BMI < 30 ,  and management of IPG and  hyperlipidemia   Obesity I have addressed  BMI and recommended wt loss of 10% of body weigh over the next 6 months using a low glycemic index diet and regular exercise a minimum of 5 days per week. His goal wt is 190 for BMI < 30  Wt Readings from Last 3 Encounters:  02/07/15 209 lb 4 oz (94.915 kg)  01/30/14 213 lb (96.616 kg)  01/14/14 213 lb (96.616 kg)       A total of 25 minutes of face to face time was spent with patient more than half of which was spent in counselling about the above mentioned conditions  and coordination of care    Updated Medication List Outpatient Encounter Prescriptions as of 02/07/2015  Medication Sig  . aspirin 81 MG tablet Take 81 mg by mouth daily.   . enalapril-hydrochlorothiazide (VASERETIC) 10-25 MG per tablet TAKE 1 TABLET BY MOUTH DAILY.  . fenofibrate 160 MG tablet Take 1 tablet (160 mg total) by mouth daily.  . pantoprazole (PROTONIX) 40 MG tablet TAKE 1 TABLET (40 MG TOTAL) BY MOUTH DAILY.  . pantoprazole (PROTONIX) 40 MG tablet Take 1 tablet (40 mg total) by mouth daily. NEEDS TO BE SEEN FOR ADDITIONAL REFILLS  . simvastatin (ZOCOR) 20 MG tablet TAKE 1 TABLET BY MOUTH AT BEDTIME  . tadalafil (CIALIS) 20 MG tablet Take 0.5-1 tablets (10-20 mg total) by mouth every other day as needed for erectile dysfunction.  . Vitamin D, Ergocalciferol, (DRISDOL) 50000 UNITS CAPS capsule Take 1 capsule (50,000 Units total) by mouth every 7 (seven) days.  . ciprofloxacin (CIPRO) 500 MG tablet Take 0.5 tablets (250 mg total) by mouth 2 (two) times daily.  . ondansetron (ZOFRAN ODT) 8 MG  disintegrating tablet Take 1 tablet (8 mg total) by mouth every 8 (eight) hours as needed for nausea or vomiting.  . [DISCONTINUED] cephALEXin (KEFLEX) 500 MG capsule Take 1 capsule (500 mg total) by mouth 2 (two) times daily. (Patient not taking: Reported on 02/07/2015)     No orders of the defined types were placed in this encounter.    Return in about 6 months (around 08/09/2015) for follow up diabetes.

## 2015-02-07 NOTE — Patient Instructions (Signed)
Goal weight for BMI < 30 is 190  2 hr post prandial goal is 150 or less   (try walking )  zofran for nausea

## 2015-02-09 ENCOUNTER — Encounter: Payer: Self-pay | Admitting: Internal Medicine

## 2015-02-09 DIAGNOSIS — E669 Obesity, unspecified: Secondary | ICD-10-CM | POA: Insufficient documentation

## 2015-02-09 NOTE — Assessment & Plan Note (Signed)
Well controlled on current regimen. Renal function stable, no changes today.  Lab Results  Component Value Date   CREATININE 1.1 01/28/2015   Lab Results  Component Value Date   NA 143 01/28/2015   K 4.1 01/28/2015

## 2015-02-09 NOTE — Assessment & Plan Note (Signed)
Hepatic enzymes are normalized and he has no signs of cirrhosis or synthetic dysfunction .  continue low glycemic index diet, renew efforts to get  BMI < 30 ,  and management of IPG and  hyperlipidemia

## 2015-02-09 NOTE — Assessment & Plan Note (Signed)
Managed with simvastatin  And fenofibrate.  LDL and triglycerides are at goal on current medications. He has no side effects and liver enzymes are normal. No changes today    Lab Results  Component Value Date   CHOL 121 01/28/2015   HDL 34* 01/28/2015   LDLCALC 67 01/28/2015   TRIG 99 01/28/2015    Lab Results  Component Value Date   ALT 35 01/28/2015   AST 28 01/28/2015   ALKPHOS 44 01/28/2015

## 2015-02-09 NOTE — Assessment & Plan Note (Addendum)
I have addressed  BMI and recommended wt loss of 10% of body weigh over the next 6 months using a low glycemic index diet and regular exercise a minimum of 5 days per week. His goal wt is 190 for BMI < 30  Wt Readings from Last 3 Encounters:  02/07/15 209 lb 4 oz (94.915 kg)  01/30/14 213 lb (96.616 kg)  01/14/14 213 lb (96.616 kg)

## 2015-02-25 ENCOUNTER — Other Ambulatory Visit: Payer: Self-pay | Admitting: Internal Medicine

## 2015-02-27 ENCOUNTER — Encounter: Payer: Self-pay | Admitting: Internal Medicine

## 2015-03-05 ENCOUNTER — Other Ambulatory Visit: Payer: Self-pay | Admitting: Internal Medicine

## 2015-03-22 ENCOUNTER — Other Ambulatory Visit: Payer: Self-pay | Admitting: Internal Medicine

## 2015-03-22 ENCOUNTER — Encounter: Payer: Self-pay | Admitting: Internal Medicine

## 2015-03-24 NOTE — Telephone Encounter (Signed)
Please advise 

## 2015-03-25 MED ORDER — TADALAFIL 20 MG PO TABS: 20.0000 mg | ORAL_TABLET | Freq: Every day | ORAL | Status: DC | PRN

## 2015-04-26 ENCOUNTER — Other Ambulatory Visit: Payer: Self-pay | Admitting: Internal Medicine

## 2015-04-29 ENCOUNTER — Other Ambulatory Visit: Payer: Self-pay | Admitting: Internal Medicine

## 2015-06-17 ENCOUNTER — Other Ambulatory Visit: Payer: Self-pay | Admitting: *Deleted

## 2015-06-17 ENCOUNTER — Encounter: Payer: Self-pay | Admitting: Internal Medicine

## 2015-06-17 MED ORDER — FENOFIBRATE 160 MG PO TABS: 160.0000 mg | ORAL_TABLET | Freq: Every day | ORAL | Status: DC

## 2015-08-13 LAB — CBC AND DIFFERENTIAL
HEMATOCRIT: 43 % (ref 41–53)
Hemoglobin: 14.7 g/dL (ref 13.5–17.5)
Neutrophils Absolute: 3 /uL
PLATELETS: 250 10*3/uL (ref 150–399)
WBC: 5.8 10^3/mL

## 2015-08-13 LAB — HEPATIC FUNCTION PANEL
ALK PHOS: 39 U/L (ref 25–125)
ALT: 39 U/L (ref 10–40)
AST: 24 U/L (ref 14–40)
Bilirubin, Total: 0.3 mg/dL

## 2015-08-13 LAB — BASIC METABOLIC PANEL
BUN: 19 mg/dL (ref 4–21)
Creatinine: 1.2 mg/dL (ref 0.6–1.3)
Glucose: 106 mg/dL
Potassium: 4.7 mmol/L (ref 3.4–5.3)
Sodium: 141 mmol/L (ref 137–147)

## 2015-08-13 LAB — LIPID PANEL
CHOLESTEROL: 124 mg/dL (ref 0–200)
HDL: 31 mg/dL — AB (ref 35–70)
LDL CALC: 68 mg/dL
TRIGLYCERIDES: 125 mg/dL (ref 40–160)

## 2015-08-13 LAB — TSH: TSH: 2.53 u[IU]/mL (ref 0.41–5.90)

## 2015-08-13 LAB — HEMOGLOBIN A1C: HEMOGLOBIN A1C: 6.2 % — AB (ref 4.0–6.0)

## 2015-08-15 ENCOUNTER — Telehealth: Payer: Self-pay | Admitting: Internal Medicine

## 2015-08-15 ENCOUNTER — Other Ambulatory Visit: Payer: Self-pay | Admitting: Internal Medicine

## 2015-08-15 ENCOUNTER — Encounter: Payer: Self-pay | Admitting: Internal Medicine

## 2015-08-15 MED ORDER — VITAMIN D (ERGOCALCIFEROL) 1.25 MG (50000 UNIT) PO CAPS: 50000.0000 [IU] | ORAL_CAPSULE | ORAL | Status: DC

## 2015-08-15 NOTE — Telephone Encounter (Signed)
My Chart message sent

## 2015-08-15 NOTE — Telephone Encounter (Signed)
Pt walk in and dropped off his lab results from [facility]. It is in your box up front. Thank You!

## 2015-08-15 NOTE — Telephone Encounter (Signed)
Please advise 

## 2015-08-15 NOTE — Telephone Encounter (Signed)
Abstracted and in yellow folder. 

## 2015-08-18 ENCOUNTER — Telehealth: Payer: Self-pay | Admitting: Internal Medicine

## 2015-08-18 DIAGNOSIS — I152 Hypertension secondary to endocrine disorders: Secondary | ICD-10-CM | POA: Insufficient documentation

## 2015-08-18 DIAGNOSIS — R7303 Prediabetes: Secondary | ICD-10-CM

## 2015-08-18 DIAGNOSIS — E669 Obesity, unspecified: Secondary | ICD-10-CM | POA: Insufficient documentation

## 2015-08-18 DIAGNOSIS — E1159 Type 2 diabetes mellitus with other circulatory complications: Secondary | ICD-10-CM | POA: Insufficient documentation

## 2015-08-18 DIAGNOSIS — E119 Type 2 diabetes mellitus without complications: Secondary | ICD-10-CM | POA: Insufficient documentation

## 2015-08-18 NOTE — Telephone Encounter (Signed)
Chart updated

## 2015-08-19 ENCOUNTER — Ambulatory Visit: Payer: Self-pay | Admitting: Internal Medicine

## 2015-08-19 ENCOUNTER — Encounter: Payer: Self-pay | Admitting: Internal Medicine

## 2015-08-19 ENCOUNTER — Ambulatory Visit (INDEPENDENT_AMBULATORY_CARE_PROVIDER_SITE_OTHER): Payer: BLUE CROSS/BLUE SHIELD | Admitting: Internal Medicine

## 2015-08-19 VITALS — BP 112/74 | HR 80 | Temp 98.3°F | Resp 16 | Ht 67.0 in | Wt 212.6 lb

## 2015-08-19 DIAGNOSIS — I10 Essential (primary) hypertension: Secondary | ICD-10-CM

## 2015-08-19 DIAGNOSIS — R7303 Prediabetes: Secondary | ICD-10-CM | POA: Diagnosis not present

## 2015-08-19 DIAGNOSIS — K760 Fatty (change of) liver, not elsewhere classified: Secondary | ICD-10-CM | POA: Diagnosis not present

## 2015-08-19 DIAGNOSIS — E785 Hyperlipidemia, unspecified: Secondary | ICD-10-CM

## 2015-08-19 NOTE — Progress Notes (Signed)
Pre visit review using our clinic review tool, if applicable. No additional management support is needed unless otherwise documented below in the visit note. 

## 2015-08-19 NOTE — Progress Notes (Signed)
Subjective:  Patient ID: Christopher Vazquez, male    DOB: Jun 14, 1966  Age: 49 y.o. MRN: [id number]  CC: The primary encounter diagnosis was Prediabetes. Diagnoses of Hepatic steatosis, Essential hypertension, and Hyperlipidemia were also pertinent to this visit.  HPI Christopher Vazquez presents for 6 month follow up on hypertension, obesity,  Hyperlipidemia and prediabetes.   Recent labs discussed. Diet and exercise reviewed in detail today.   Outpatient Prescriptions Prior to Visit  Medication Sig Dispense Refill  . aspirin 81 MG tablet Take 81 mg by mouth daily.     . ciprofloxacin (CIPRO) 500 MG tablet Take 0.5 tablets (250 mg total) by mouth 2 (two) times daily. 28 tablet 0  . enalapril-hydrochlorothiazide (VASERETIC) 10-25 MG per tablet TAKE 1 TABLET BY MOUTH DAILY. 90 tablet 1  . fenofibrate 160 MG tablet Take 1 tablet (160 mg total) by mouth daily. 90 tablet 2  . ondansetron (ZOFRAN ODT) 8 MG disintegrating tablet Take 1 tablet (8 mg total) by mouth every 8 (eight) hours as needed for nausea or vomiting. 20 tablet 0  . pantoprazole (PROTONIX) 40 MG tablet TAKE 1 TABLET (40 MG TOTAL) BY MOUTH DAILY. 30 tablet 1  . pantoprazole (PROTONIX) 40 MG tablet Take 1 tablet (40 mg total) by mouth daily. NEEDS TO BE SEEN FOR ADDITIONAL REFILLS 30 tablet 0  . simvastatin (ZOCOR) 20 MG tablet TAKE 1 TABLET AT BEDTIME 90 tablet 1  . simvastatin (ZOCOR) 20 MG tablet TAKE 1 TABLET BY MOUTH AT BEDTIME 30 tablet 5  . tadalafil (CIALIS) 20 MG tablet Take 1 tablet (20 mg total) by mouth daily as needed for erectile dysfunction. 6 tablet 11  . Vitamin D, Ergocalciferol, (DRISDOL) 50000 UNITS CAPS capsule Take 1 capsule (50,000 Units total) by mouth every 7 (seven) days. 12 capsule 3   No facility-administered medications prior to visit.    Review of Systems;  Patient denies headache, fevers, malaise, unintentional weight loss, skin rash, eye pain, sinus congestion and sinus pain, sore throat, dysphagia,   hemoptysis , cough, dyspnea, wheezing, chest pain, palpitations, orthopnea, edema, abdominal pain, nausea, melena, diarrhea, constipation, flank pain, dysuria, hematuria, urinary  Frequency, nocturia, numbness, tingling, seizures,  Focal weakness, Loss of consciousness,  Tremor, insomnia, depression, anxiety, and suicidal ideation.      Objective:  BP 112/74 mmHg  Pulse 80  Temp(Src) 98.3 F (36.8 C) (Oral)  Resp 16  Ht 5\' 7"  (1.702 m)  Wt 212 lb 9.6 oz (96.435 kg)  BMI 33.29 kg/m2  SpO2 97%  BP Readings from Last 3 Encounters:  08/19/15 112/74  02/07/15 122/72  01/30/14 120/78    Wt Readings from Last 3 Encounters:  08/19/15 212 lb 9.6 oz (96.435 kg)  02/07/15 209 lb 4 oz (94.915 kg)  01/30/14 213 lb (96.616 kg)    General appearance: alert, cooperative and appears stated age Neck: no adenopathy, no carotid bruit, supple, symmetrical, trachea midline and thyroid not enlarged, symmetric, no tenderness/mass/nodules Back: symmetric, no curvature. ROM normal. No CVA tenderness. Lungs: clear to auscultation bilaterally Heart: regular rate and rhythm, S1, S2 normal, no murmur, click, rub or gallop Abdomen: soft, non-tender; bowel sounds normal; no masses,  no organomegaly Pulses: 2+ and symmetric Skin: Skin color, texture, turgor normal. No rashes or lesions L  Lab Results  Component Value Date   HGBA1C 6.2* 08/13/2015   HGBA1C 6.1* 01/28/2015   HGBA1C 6.0 02/15/2014    Lab Results  Component Value Date   CREATININE 1.2 08/13/2015  CREATININE 1.1 01/28/2015   CREATININE 1.2 02/15/2014    Lab Results  Component Value Date   WBC 5.8 08/13/2015   HGB 14.7 08/13/2015   HCT 43 08/13/2015   PLT 250 08/13/2015   CHOL 124 08/13/2015   TRIG 125 08/13/2015   HDL 31* 08/13/2015   LDLCALC 68 08/13/2015   ALT 39 08/13/2015   AST 24 08/13/2015   NA 141 08/13/2015   K 4.7 08/13/2015   CREATININE 1.2 08/13/2015   BUN 19 08/13/2015   TSH 2.53 08/13/2015   HGBA1C 6.2*  08/13/2015    No results found.  Assessment & Plan:   Problem List Items Addressed This Visit    Hyperlipidemia    Managed with simvastatin  And fenofibrate.  LDL and triglycerides are at goal on current medications. Low HDL discussed, He has no side effects and liver enzymes are normal. No changes today    Lab Results  Component Value Date   CHOL 124 08/13/2015   HDL 31* 08/13/2015   LDLCALC 68 08/13/2015   TRIG 125 08/13/2015    Lab Results  Component Value Date   ALT 39 08/13/2015   AST 24 08/13/2015   ALKPHOS 39 08/13/2015            Essential hypertension    Well controlled on current regimen. Renal function stable, no changes today.  Lab Results  Component Value Date   CREATININE 1.2 08/13/2015   Lab Results  Component Value Date   NA 141 08/13/2015   K 4.7 08/13/2015         Hepatic steatosis    Hepatic enzymes are normalized and he has no signs of cirrhosis or synthetic dysfunction .  continue low glycemic index diet, renew efforts to get  BMI < 30 ,  and management of IPG and  hyperlipidemia  Lab Results  Component Value Date   ALT 39 08/13/2015   AST 24 08/13/2015   ALKPHOS 39 08/13/2015         Prediabetes - Primary    Greater than half of this 30 minute visit was spent reviewing diet and exercise and counselling patient .  Low GI diet and increased participation in aerobic exercise recommendation.         I am having Mr. Hislop maintain his aspirin, pantoprazole, pantoprazole, ondansetron, ciprofloxacin, enalapril-hydrochlorothiazide, simvastatin, simvastatin, tadalafil, fenofibrate, and Vitamin D (Ergocalciferol).  No orders of the defined types were placed in this encounter.    There are no discontinued medications.  Follow-up: Return in about 3 months (around 11/19/2015).   Crecencio Mc, MD

## 2015-08-19 NOTE — Patient Instructions (Signed)
There are MANY diets that will work, but they need to be lifestyle changes accompanied by aerobic exercise  5 days a week to achieve and maintain your goal weight.   The  diet I discussed with you today is the 10 day Green Smoothie Cleansing /Detox Diet by Linden Dolin . available on [employer] for around $10.  This is not a low carb or a weight loss diet,  It is fundamentally a "cleansing" low fat diet that eliminates sugar, gluten, caffeine, alcohol and dairy for 10 days .  What you add back after the initial ten days is entirely up to  you!  You can expect to lose 5 to 10 lbs depending on how strict you are.   If you decide to do the diet, I would suggest drinking 2 smoothies daily and keeping one chewable meal (but keep it simple, like baked fish and salad, rice or bok choy) .  You snack primarily on fresh  fruit, egg whites and judicious quantities of nuts. You can add vegetable based protein powder (nothing with whey , since whey is dairy) in it.  [employer] has a [employer] political [employer] .  It does require some form of a nutrient extractor (Vita Mix, a electric juicer,  Or a Nutribullet Rx).  i have found that using frozen fruits is much more convenient and cost effective. You can even find plenty of organic fruit in the frozen fruit section of [employer].  Just thaw what you need for the following day the night before in the refrigerator (to avoid jamming up your juicer/blender)

## 2015-08-20 NOTE — Assessment & Plan Note (Signed)
Well controlled on current regimen. Renal function stable, no changes today.  Lab Results  Component Value Date   CREATININE 1.2 08/13/2015   Lab Results  Component Value Date   NA 141 08/13/2015   K 4.7 08/13/2015

## 2015-08-20 NOTE — Assessment & Plan Note (Signed)
Greater than half of this 30 minute visit was spent reviewing diet and exercise and counselling patient .  Low GI diet and increased participation in aerobic exercise recommendation.

## 2015-08-20 NOTE — Assessment & Plan Note (Signed)
Hepatic enzymes are normalized and he has no signs of cirrhosis or synthetic dysfunction .  continue low glycemic index diet, renew efforts to get  BMI < 30 ,  and management of IPG and  hyperlipidemia  Lab Results  Component Value Date   ALT 39 08/13/2015   AST 24 08/13/2015   ALKPHOS 39 08/13/2015

## 2015-08-20 NOTE — Assessment & Plan Note (Signed)
Managed with simvastatin  And fenofibrate.  LDL and triglycerides are at goal on current medications. Low HDL discussed, He has no side effects and liver enzymes are normal. No changes today    Lab Results  Component Value Date   CHOL 124 08/13/2015   HDL 31* 08/13/2015   LDLCALC 68 08/13/2015   TRIG 125 08/13/2015    Lab Results  Component Value Date   ALT 39 08/13/2015   AST 24 08/13/2015   ALKPHOS 39 08/13/2015

## 2015-08-30 ENCOUNTER — Other Ambulatory Visit: Payer: Self-pay | Admitting: Internal Medicine

## 2015-10-13 ENCOUNTER — Encounter: Payer: Self-pay | Admitting: Internal Medicine

## 2015-10-15 ENCOUNTER — Encounter: Payer: Self-pay | Admitting: Internal Medicine

## 2015-11-04 ENCOUNTER — Other Ambulatory Visit: Payer: Self-pay | Admitting: Internal Medicine

## 2015-11-05 MED ORDER — PANTOPRAZOLE SODIUM 40 MG PO TBEC: 40.0000 mg | DELAYED_RELEASE_TABLET | Freq: Every day | ORAL | Status: DC

## 2015-11-05 NOTE — Addendum Note (Signed)
Addended by: Bevelyn Ngo on: 11/05/2015 09:31 AM   Modules accepted: Orders

## 2015-11-14 LAB — BASIC METABOLIC PANEL
BUN: 17 mg/dL (ref 4–21)
CREATININE: 1.1 mg/dL (ref 0.6–1.3)
Glucose: 96 mg/dL
POTASSIUM: 4.4 mmol/L (ref 3.4–5.3)
SODIUM: 139 mmol/L (ref 137–147)

## 2015-11-14 LAB — HEPATIC FUNCTION PANEL
ALK PHOS: 46 U/L (ref 25–125)
ALT: 41 U/L — AB (ref 10–40)
AST: 25 U/L (ref 14–40)
BILIRUBIN, TOTAL: 0.3 mg/dL

## 2015-11-14 LAB — TSH: TSH: 2.36 u[IU]/mL (ref 0.41–5.90)

## 2015-11-14 LAB — LIPID PANEL
Cholesterol: 114 mg/dL (ref 0–200)
HDL: 30 mg/dL — AB (ref 35–70)
LDL Cholesterol: 58 mg/dL
TRIGLYCERIDES: 130 mg/dL (ref 40–160)

## 2015-11-14 LAB — CBC AND DIFFERENTIAL
HEMATOCRIT: 41 % (ref 41–53)
Hemoglobin: 13.9 g/dL (ref 13.5–17.5)
NEUTROS ABS: 3 /uL
Platelets: 260 10*3/uL (ref 150–399)
WBC: 5.6 10^3/mL

## 2015-11-14 LAB — HEMOGLOBIN A1C: Hemoglobin A1C: 5.9

## 2015-11-17 ENCOUNTER — Encounter: Payer: Self-pay | Admitting: Internal Medicine

## 2015-11-19 ENCOUNTER — Ambulatory Visit (INDEPENDENT_AMBULATORY_CARE_PROVIDER_SITE_OTHER): Payer: BLUE CROSS/BLUE SHIELD | Admitting: Internal Medicine

## 2015-11-19 ENCOUNTER — Ambulatory Visit: Payer: Self-pay | Admitting: Internal Medicine

## 2015-11-19 ENCOUNTER — Encounter: Payer: Self-pay | Admitting: Internal Medicine

## 2015-11-19 VITALS — BP 126/84 | HR 76 | Temp 98.2°F | Resp 12 | Ht 67.0 in | Wt 219.0 lb

## 2015-11-19 DIAGNOSIS — R7303 Prediabetes: Secondary | ICD-10-CM | POA: Diagnosis not present

## 2015-11-19 DIAGNOSIS — K760 Fatty (change of) liver, not elsewhere classified: Secondary | ICD-10-CM

## 2015-11-19 DIAGNOSIS — E669 Obesity, unspecified: Secondary | ICD-10-CM

## 2015-11-19 DIAGNOSIS — E785 Hyperlipidemia, unspecified: Secondary | ICD-10-CM

## 2015-11-19 DIAGNOSIS — I10 Essential (primary) hypertension: Secondary | ICD-10-CM

## 2015-11-19 MED ORDER — PHENTERMINE HCL 37.5 MG PO TABS: ORAL_TABLET | ORAL | Status: DC

## 2015-11-19 NOTE — Patient Instructions (Addendum)
I have authorized the use of phentermine for 3 months.  Please have your vital signs checked a week after starting, and return to see me in 3 months.take 1/2 tablet in the morning,  The second half by 2 PM to avoid insomnia.     If you have not lost 5% of your starting weight by the end of the  3 months (11 lbs),  We will need to rethink our strategy .  Gaol with bp is < 140/80  pulse < 90  This is my  example of a  "Low GI"  Diet:  It will allow you to lose 4 to 8  lbs  per month if you follow it carefully.  Your goal with exercise is a minimum of 30 minutes of aerobic exercise 5 days per week (Walking does not count once it becomes easy!)    All of the foods can be found at grocery stores and in bulk at [employer].  The Atkins protein bars and shakes are available in more varieties at [employer], [employer] and [employer].     7 AM Breakfast:  Choose from the following:  Low carbohydrate Protein  Shakes (I recommend the  Premier Protein chocolate shake, s EAS AdvantEdge "Carb Control" shakes  Or the low carb shakes by Atkins.    2.5 carbs)   Arnold's "Sandwhich Thin"toasted  w/ peanut butter (no jelly: about 20 net carbs  "Bagel Thin" with cream cheese and salmon: about 20 carbs   a scrambled egg/bacon/cheese burrito made with Mission's "carb balance" whole wheat tortilla  (about 10 net carbs )  Regulatory affairs officer (basically a quiche without the pastry crust) that is eaten cold and very convenient way to get your eggs  If you make your own shakes, avoid bananas and pineapple,  And use low carb greek yogurt or almond milk    Avoid cereal and bananas, oatmeal and cream of wheat and grits. They are loaded with carbohydrates!   10 AM: high protein snack:  Protein bar by Atkins (the snack size, under 200 cal, usually < 6 net carbs).    A stick of cheese:  Around 1 carb,  100 cal     Dannon Light n Fit [place] Yogurt  (80 cal, 8 carbs)  Other so called "protein bars" and Greek  yogurts tend to be loaded with carbohydrates.  Remember, in food advertising, the word "energy" is synonymous for " carbohydrate."  Lunch:   A Sandwich using the bread choices listed, Can use any  Eggs,  lunchmeat, grilled meat or canned tuna), avocado, regular mayo/mustard  and cheese.  A Salad using blue cheese, ranch,  Goddess or vinagrette,  Avoid taco shells, croutons or "confetti" and no "candied nuts" but regular nuts OK.   No pretzels, nabs  or chips.  Pickles and miniature sweet peppers are a good low carb alternative that provide a "crunch"  The bread is the only source of carbohydrate in a sandwich and  can be decreased by trying some of these alternatives to traditional loaf bread  Viraj's makes a pita bread and a flat bread that are 50 cal and 4 net carbs available at [place] and [place].  This can be toasted to use with hummous as well  Toufayan makes a low carb flatbread that's 100 cal and 9 net carbs available at [employer] and [employer]J's makes 2 sizes of  Low carb whole wheat tortilla  (The large one is 210  cal and 6 net carbs)  Ezekiel bread is a loaf bread sold in the frozen section of higher end grocery chains,  Very low carb  Avoid "Low fat dressings, as well as [place] dressings They are loaded with sugar!   3 PM/ Mid day  Snack:  Consider  1 ounce of  almonds, walnuts, pistachios, pecans, peanuts,  Macadamia nuts or a nut medley.  Avoid "granola"; the dried cranberries and raisins are loaded with carbohydrates. Mixed nuts as long as there are no raisins,  cranberries or dried fruit.    Try the prosciutto/mozzarella cheese sticks by Fiorruci  In deli /backery section   High protein      6 PM  Dinner:     Meat/fowl/fish with a green salad, and either broccoli, cauliflower, green beans, spinach, brussel sprouts or  Lima beans. DO NOT BREAD THE PROTEIN!!      There is a low carb pasta by Dreamfield's that is acceptable and tastes great: only 5 digestible  carbs/serving.( All grocery stores but BJs carry it )  Try Hurley Cisco Angelo's chicken piccata or chicken or eggplant parm over low carb pasta.(Lowes and BJs)   Marjory Lies Sanchez's "Carnitas" (pulled pork, no sauce,  0 carbs) or his beef pot roast to make a dinner burrito (at [employer]J's)  Pesto over low carb pasta (bj's sells a good quality pesto in the center refrigerated section of the deli   Try satueeing  Cheral Marker with mushroooms  Whole wheat pasta is still full of digestible carbs and  Not as low in glycemic index as Dreamfield's.   Brown rice is still rice,  So skip the rice and noodles if you eat [place] or [place] (or at least limit to 1/2 cup)  9 PM snack :   Breyer's "low carb" fudgsicle or  ice cream bar (Carb Smart line), or  Weight Watcher's ice cream bar , or another "no sugar added" ice cream;  a serving of fresh berries/cherries with whipped cream   Cheese or DANNON'S LlGHT N FIT GREEK YOGURT  8 ounces of [place] unsweetened almond/cococunut milk    Treat yourself to a parfait made with whipped cream blueberiies, walnuts and vanilla greek yogurt  Avoid bananas, pineapple, grapes  and watermelon on a regular basis because they are high in sugar.  THINK OF THEM AS DESSERT  Remember that snack Substitutions should be less than 10 NET carbs per serving and meals < 20 carbs. Remember to subtract fiber grams to get the "net carbs."

## 2015-11-19 NOTE — Progress Notes (Signed)
Subjective:  Patient ID: Christopher Vazquez, male    DOB: [birth year]  Age: 50 y.o. MRN: [id number]  CC: The primary encounter diagnosis was Essential hypertension. Diagnoses of Hepatic steatosis, Hyperlipidemia, Prediabetes, and Obesity were also pertinent to this visit.  HPI CURREN DELON presents for follow up on elevated A1c (pre diabetes) found at last visit  In the setting of obesity and hypertension  He has gained 10 lbs since April and is frustrated by his weight gain.  His schedule is very busy. He has been working 10-11 hours daily and is studying online at night to finish his degree,  And using his treadmill several times per week.   Diet reviewed in detail and specific recommendations made.  Lab Results  Component Value Date   HGBA1C 5.9 11/14/2015     Outpatient Prescriptions Prior to Visit  Medication Sig Dispense Refill  . aspirin 81 MG tablet Take 81 mg by mouth daily.     . enalapril-hydrochlorothiazide (VASERETIC) 10-25 MG tablet TAKE 1 TABLET BY MOUTH DAILY. 90 tablet 1  . fenofibrate 160 MG tablet Take 1 tablet (160 mg total) by mouth daily. 90 tablet 2  . pantoprazole (PROTONIX) 40 MG tablet TAKE 1 TABLET (40 MG TOTAL) BY MOUTH DAILY. 30 tablet 1  . simvastatin (ZOCOR) 20 MG tablet TAKE 1 TABLET AT BEDTIME 90 tablet 1  . tadalafil (CIALIS) 20 MG tablet Take 1 tablet (20 mg total) by mouth daily as needed for erectile dysfunction. 6 tablet 11  . Vitamin D, Ergocalciferol, (DRISDOL) 50000 UNITS CAPS capsule Take 1 capsule (50,000 Units total) by mouth every 7 (seven) days. 12 capsule 3  . ondansetron (ZOFRAN ODT) 8 MG disintegrating tablet Take 1 tablet (8 mg total) by mouth every 8 (eight) hours as needed for nausea or vomiting. (Patient not taking: Reported on 11/19/2015) 20 tablet 0  . ciprofloxacin (CIPRO) 500 MG tablet Take 0.5 tablets (250 mg total) by mouth 2 (two) times daily. 28 tablet 0  . pantoprazole (PROTONIX) 40 MG tablet Take 1 tablet (40 mg total) by mouth  daily. NEEDS TO BE SEEN FOR ADDITIONAL REFILLS 30 tablet 0  . simvastatin (ZOCOR) 20 MG tablet TAKE 1 TABLET BY MOUTH AT BEDTIME 30 tablet 5   No facility-administered medications prior to visit.    Review of Systems;  Patient denies headache, fevers, malaise, unintentional weight loss, skin rash, eye pain, sinus congestion and sinus pain, sore throat, dysphagia,  hemoptysis , cough, dyspnea, wheezing, chest pain, palpitations, orthopnea, edema, abdominal pain, nausea, melena, diarrhea, constipation, flank pain, dysuria, hematuria, urinary  Frequency, nocturia, numbness, tingling, seizures,  Focal weakness, Loss of consciousness,  Tremor, insomnia, depression, anxiety, and suicidal ideation.      Objective:  BP 126/84 mmHg  Pulse 76  Temp(Src) 98.2 F (36.8 C) (Oral)  Resp 12  Ht 5\' 7"  (1.702 m)  Wt 219 lb (99.338 kg)  BMI 34.29 kg/m2  SpO2 98%  BP Readings from Last 3 Encounters:  11/19/15 126/84  08/19/15 112/74  02/07/15 122/72    Wt Readings from Last 3 Encounters:  11/19/15 219 lb (99.338 kg)  08/19/15 212 lb 9.6 oz (96.435 kg)  02/07/15 209 lb 4 oz (94.915 kg)    General appearance: alert, cooperative and appears stated age Ears: normal TM's and external ear canals both ears Throat: lips, mucosa, and tongue normal; teeth and gums normal Neck: no adenopathy, no carotid bruit, supple, symmetrical, trachea midline and thyroid not enlarged, symmetric, no tenderness/mass/nodules Back:  symmetric, no curvature. ROM normal. No CVA tenderness. Lungs: clear to auscultation bilaterally Heart: regular rate and rhythm, S1, S2 normal, no murmur, click, rub or gallop Abdomen: soft, non-tender; bowel sounds normal; no masses,  no organomegaly Pulses: 2+ and symmetric Skin: Skin color, texture, turgor normal. No rashes or lesions Lymph nodes: Cervical, supraclavicular, and axillary nodes normal.  Lab Results  Component Value Date   HGBA1C 5.9 11/14/2015   HGBA1C 6.2* 08/13/2015    HGBA1C 6.1* 01/28/2015    Lab Results  Component Value Date   CREATININE 1.1 11/14/2015   CREATININE 1.2 08/13/2015   CREATININE 1.1 01/28/2015    Lab Results  Component Value Date   WBC 5.6 11/14/2015   HGB 13.9 11/14/2015   HCT 41 11/14/2015   PLT 260 11/14/2015   CHOL 114 11/14/2015   TRIG 130 11/14/2015   HDL 30* 11/14/2015   LDLCALC 58 11/14/2015   ALT 41* 11/14/2015   AST 25 11/14/2015   NA 139 11/14/2015   K 4.4 11/14/2015   CREATININE 1.1 11/14/2015   BUN 17 11/14/2015   TSH 2.36 11/14/2015   HGBA1C 5.9 11/14/2015    No results found.  Assessment & Plan:   Problem List Items Addressed This Visit    Hyperlipidemia    Managed with simvastatin  And fenofibrate.  LDL and triglycerides are at goal on current medications. Low HDL discussed, He has no side effects and liver enzymes are normal. No changes today    Lab Results  Component Value Date   CHOL 114 11/14/2015   HDL 30* 11/14/2015   LDLCALC 58 11/14/2015   TRIG 130 11/14/2015    Lab Results  Component Value Date   ALT 41* 11/14/2015   AST 25 11/14/2015   ALKPHOS 46 11/14/2015              Essential hypertension - Primary    Well controlled on current regimen. Renal function stable, no changes today.  Lab Results  Component Value Date   CREATININE 1.1 11/14/2015   Lab Results  Component Value Date   NA 139 11/14/2015   K 4.4 11/14/2015         Hepatic steatosis    Hepatic enzymes are nearly normalized and he has no signs of cirrhosis or synthetic dysfunction .  continue low glycemic index diet, renew efforts to get  BMI < 30 ,  and management of IPG and  hyperlipidemia  Lab Results  Component Value Date   ALT 41* 11/14/2015   AST 25 11/14/2015   ALKPHOS 46 11/14/2015           Obesity    I have addressed  BMI and recommended wt loss of 10% of body weight over the next 6 months using a low glycemic index diet and regular exercise a minimum of 5 days per week. His  goal wt is 190 for BMI < 30. he has had difficulty losing weight due to increased appetite and is requesting a trial of  Phentermine.  he is aware of the possible side effects and risks and understands that    The medication will be discontinued if she has not lost 5% of her body weight over the next 3 months, which , based on today's weight is 11 lbs.  Wt Readings from Last 3 Encounters:  11/19/15 219 lb (99.338 kg)  08/19/15 212 lb 9.6 oz (96.435 kg)  02/07/15 209 lb 4 oz (94.915 kg)  Relevant Medications   phentermine (ADIPEX-P) 37.5 MG tablet   Prediabetes    Greater than half of this 30 minute visit was spent reviewing diet and exercise and counselling patient .  Low GI diet and increased participation in aerobic exercise recommended.  Appetite suppression with phentermine discussed.           A total of 25 minutes of face to face time was spent with patient more than half of which was spent in counselling about the above mentioned conditions  and coordination of care  I have discontinued Mr. Kundinger ciprofloxacin. I am also having him start on phentermine. Additionally, I am having him maintain his aspirin, pantoprazole, ondansetron, tadalafil, fenofibrate, Vitamin D (Ergocalciferol), enalapril-hydrochlorothiazide, and simvastatin.  Meds ordered this encounter  Medications  . phentermine (ADIPEX-P) 37.5 MG tablet    Sig: 1/2 tablet in the am and early afternoon    Dispense:  31 tablet    Refill:  2    Medications Discontinued During This Encounter  Medication Reason  . ciprofloxacin (CIPRO) 500 MG tablet Completed Course  . pantoprazole (PROTONIX) 40 MG tablet Patient Preference  . simvastatin (ZOCOR) 20 MG tablet Duplicate    Follow-up: Return in about 3 months (around 02/17/2016) for follow up diabetes.   Crecencio Mc, MD

## 2015-11-19 NOTE — Progress Notes (Signed)
Pre-visit discussion using our clinic review tool. No additional management support is needed unless otherwise documented below in the visit note.  

## 2015-11-22 NOTE — Assessment & Plan Note (Signed)
Well controlled on current regimen. Renal function stable, no changes today.  Lab Results  Component Value Date   CREATININE 1.1 11/14/2015   Lab Results  Component Value Date   NA 139 11/14/2015   K 4.4 11/14/2015

## 2015-11-22 NOTE — Assessment & Plan Note (Signed)
Managed with simvastatin  And fenofibrate.  LDL and triglycerides are at goal on current medications. Low HDL discussed, He has no side effects and liver enzymes are normal. No changes today    Lab Results  Component Value Date   CHOL 114 11/14/2015   HDL 30* 11/14/2015   LDLCALC 58 11/14/2015   TRIG 130 11/14/2015    Lab Results  Component Value Date   ALT 41* 11/14/2015   AST 25 11/14/2015   ALKPHOS 46 11/14/2015

## 2015-11-22 NOTE — Assessment & Plan Note (Signed)
Hepatic enzymes are nearly normalized and he has no signs of cirrhosis or synthetic dysfunction .  continue low glycemic index diet, renew efforts to get  BMI < 30 ,  and management of IPG and  hyperlipidemia  Lab Results  Component Value Date   ALT 41* 11/14/2015   AST 25 11/14/2015   ALKPHOS 46 11/14/2015

## 2015-11-22 NOTE — Assessment & Plan Note (Signed)
I have addressed  BMI and recommended wt loss of 10% of body weight over the next 6 months using a low glycemic index diet and regular exercise a minimum of 5 days per week. His goal wt is 190 for BMI < 30. he has had difficulty losing weight due to increased appetite and is requesting a trial of  Phentermine.  he is aware of the possible side effects and risks and understands that    The medication will be discontinued if she has not lost 5% of her body weight over the next 3 months, which , based on today's weight is 11 lbs.  Wt Readings from Last 3 Encounters:  11/19/15 219 lb (99.338 kg)  08/19/15 212 lb 9.6 oz (96.435 kg)  02/07/15 209 lb 4 oz (94.915 kg)

## 2015-11-22 NOTE — Assessment & Plan Note (Signed)
Greater than half of this 30 minute visit was spent reviewing diet and exercise and counselling patient .  Low GI diet and increased participation in aerobic exercise recommended.  Appetite suppression with phentermine discussed.

## 2015-11-24 ENCOUNTER — Other Ambulatory Visit: Payer: Self-pay | Admitting: Internal Medicine

## 2015-11-27 ENCOUNTER — Encounter: Payer: Self-pay | Admitting: Internal Medicine

## 2016-02-02 ENCOUNTER — Encounter: Payer: Self-pay | Admitting: Internal Medicine

## 2016-02-02 NOTE — Telephone Encounter (Signed)
FYI for appointment have sent message back to patient.

## 2016-02-04 ENCOUNTER — Ambulatory Visit (INDEPENDENT_AMBULATORY_CARE_PROVIDER_SITE_OTHER): Payer: BLUE CROSS/BLUE SHIELD | Admitting: Family Medicine

## 2016-02-04 VITALS — BP 118/78 | HR 75 | Temp 97.7°F | Resp 18 | Wt 202.5 lb

## 2016-02-04 DIAGNOSIS — M7918 Myalgia, other site: Secondary | ICD-10-CM

## 2016-02-04 DIAGNOSIS — M791 Myalgia: Secondary | ICD-10-CM

## 2016-02-04 MED ORDER — CYCLOBENZAPRINE HCL 10 MG PO TABS: 10.0000 mg | ORAL_TABLET | Freq: Three times a day (TID) | ORAL | Status: DC | PRN

## 2016-02-04 NOTE — Progress Notes (Signed)
Pre visit review using our clinic review tool, if applicable. No additional management support is needed unless otherwise documented below in the visit note. 
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
## 2016-02-04 NOTE — Patient Instructions (Signed)
It was nice to see you today.  Use the ibuprofen as needed.  Use the muscle relaxant (flexeril) as needed.  We will be in touch regarding your PT appt.  Follow up:  As needed/as previously scheduled.  Take care  Dr. Lacinda Axon
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
## 2016-02-09 ENCOUNTER — Encounter: Payer: Self-pay | Admitting: Internal Medicine

## 2016-02-09 ENCOUNTER — Ambulatory Visit (INDEPENDENT_AMBULATORY_CARE_PROVIDER_SITE_OTHER): Payer: BLUE CROSS/BLUE SHIELD | Admitting: Internal Medicine

## 2016-02-09 VITALS — BP 128/80 | HR 84 | Temp 97.9°F | Resp 12 | Ht 67.0 in | Wt 204.8 lb

## 2016-02-09 DIAGNOSIS — M791 Myalgia: Secondary | ICD-10-CM

## 2016-02-09 DIAGNOSIS — E669 Obesity, unspecified: Secondary | ICD-10-CM | POA: Diagnosis not present

## 2016-02-09 DIAGNOSIS — E559 Vitamin D deficiency, unspecified: Secondary | ICD-10-CM

## 2016-02-09 DIAGNOSIS — M542 Cervicalgia: Secondary | ICD-10-CM

## 2016-02-09 DIAGNOSIS — M7918 Myalgia, other site: Secondary | ICD-10-CM

## 2016-02-09 DIAGNOSIS — R232 Flushing: Secondary | ICD-10-CM

## 2016-02-09 NOTE — Patient Instructions (Signed)
Low testosterone may be the cause of your recurrent hot flashes.  We'll check your levels and if normal,  We'll start looking for other causes

## 2016-02-09 NOTE — Progress Notes (Signed)
Pre-visit discussion using our clinic review tool. No additional management support is needed unless otherwise documented below in the visit note.  
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
## 2016-02-10 ENCOUNTER — Encounter: Payer: Self-pay | Admitting: Internal Medicine

## 2016-02-10 ENCOUNTER — Telehealth: Payer: Self-pay | Admitting: Internal Medicine

## 2016-02-10 ENCOUNTER — Encounter
Admission: RE | Admit: 2016-02-10 | Discharge: 2016-02-10 | Disposition: A | Discharge status: Home / Self Care | Payer: BLUE CROSS/BLUE SHIELD | Source: Ambulatory Visit | Attending: Internal Medicine | Admitting: Internal Medicine

## 2016-02-10 ENCOUNTER — Ambulatory Visit
Admission: RE | Admit: 2016-02-10 | Discharge: 2016-02-10 | Disposition: A | Discharge status: Home / Self Care | Payer: BLUE CROSS/BLUE SHIELD | Source: Ambulatory Visit | Attending: Internal Medicine | Admitting: Internal Medicine

## 2016-02-10 DIAGNOSIS — M791 Myalgia: Secondary | ICD-10-CM | POA: Insufficient documentation

## 2016-02-10 DIAGNOSIS — M47892 Other spondylosis, cervical region: Secondary | ICD-10-CM | POA: Diagnosis not present

## 2016-02-10 DIAGNOSIS — M542 Cervicalgia: Secondary | ICD-10-CM | POA: Diagnosis not present

## 2016-02-10 DIAGNOSIS — R232 Flushing: Secondary | ICD-10-CM | POA: Insufficient documentation

## 2016-02-10 DIAGNOSIS — M7918 Myalgia, other site: Secondary | ICD-10-CM
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
## 2016-02-10 NOTE — Assessment & Plan Note (Signed)
Suspect low t.  Thyroid function was normal in January.  Checking testosterone levels.  Do not resume phentermine.  If T is normal will obtain chest x ray and send to GI for EGD/colonoscopy

## 2016-02-10 NOTE — Assessment & Plan Note (Signed)
Chronic, recurrent whenever the weekly megadose is stopped. Continue weekly megadose.

## 2016-02-10 NOTE — Assessment & Plan Note (Signed)
I have congratulated him in reduction of   BMI and encouraged  Continued weight loss with goal of 10% of body weigh over the next 6 months using a low glycemic index diet and regular exercise a minimum of 5 days per week.   

## 2016-02-11 ENCOUNTER — Ambulatory Visit: Payer: BLUE CROSS/BLUE SHIELD | Attending: Family Medicine

## 2016-02-11 DIAGNOSIS — M549 Dorsalgia, unspecified: Secondary | ICD-10-CM | POA: Diagnosis present

## 2016-02-11 DIAGNOSIS — M791 Myalgia: Secondary | ICD-10-CM | POA: Insufficient documentation

## 2016-02-11 DIAGNOSIS — M546 Pain in thoracic spine: Secondary | ICD-10-CM | POA: Insufficient documentation

## 2016-02-11 DIAGNOSIS — R293 Abnormal posture: Secondary | ICD-10-CM | POA: Insufficient documentation

## 2016-02-11 DIAGNOSIS — M7918 Myalgia, other site: Secondary | ICD-10-CM

## 2016-02-11 NOTE — Patient Instructions (Signed)
Gave supine bridge with thoracic towel roll (behind shoulder blades) to promote thoracic extension 10x3 daily as part of his HEP. Pt demonstrated and verbalized understanding.
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
## 2016-02-12 ENCOUNTER — Other Ambulatory Visit: Payer: Self-pay | Admitting: Internal Medicine

## 2016-02-15 ENCOUNTER — Encounter: Payer: Self-pay | Admitting: Internal Medicine

## 2016-02-17 ENCOUNTER — Telehealth: Payer: Self-pay | Admitting: Internal Medicine

## 2016-02-17 ENCOUNTER — Other Ambulatory Visit: Payer: Self-pay | Admitting: Internal Medicine

## 2016-02-17 ENCOUNTER — Ambulatory Visit: Payer: BLUE CROSS/BLUE SHIELD

## 2016-02-17 DIAGNOSIS — M791 Myalgia: Secondary | ICD-10-CM | POA: Diagnosis not present

## 2016-02-17 DIAGNOSIS — R293 Abnormal posture: Secondary | ICD-10-CM

## 2016-02-17 DIAGNOSIS — R232 Flushing: Secondary | ICD-10-CM

## 2016-02-17 DIAGNOSIS — M546 Pain in thoracic spine: Secondary | ICD-10-CM
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
## 2016-02-19 ENCOUNTER — Ambulatory Visit: Payer: BLUE CROSS/BLUE SHIELD

## 2016-02-19 DIAGNOSIS — M546 Pain in thoracic spine: Secondary | ICD-10-CM

## 2016-02-19 DIAGNOSIS — R293 Abnormal posture: Secondary | ICD-10-CM

## 2016-02-19 DIAGNOSIS — M791 Myalgia: Secondary | ICD-10-CM | POA: Diagnosis not present
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
## 2016-02-24 ENCOUNTER — Ambulatory Visit: Payer: BLUE CROSS/BLUE SHIELD

## 2016-02-24 DIAGNOSIS — M791 Myalgia: Secondary | ICD-10-CM | POA: Diagnosis not present

## 2016-02-24 DIAGNOSIS — M546 Pain in thoracic spine: Secondary | ICD-10-CM

## 2016-02-24 DIAGNOSIS — R293 Abnormal posture: Secondary | ICD-10-CM
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
## 2016-02-25 ENCOUNTER — Other Ambulatory Visit: Payer: Self-pay | Admitting: Internal Medicine

## 2016-02-26 ENCOUNTER — Ambulatory Visit: Payer: BLUE CROSS/BLUE SHIELD

## 2016-02-26 DIAGNOSIS — M546 Pain in thoracic spine: Secondary | ICD-10-CM

## 2016-02-26 DIAGNOSIS — R293 Abnormal posture: Secondary | ICD-10-CM

## 2016-02-26 DIAGNOSIS — M791 Myalgia: Secondary | ICD-10-CM | POA: Diagnosis not present

## 2016-02-26 NOTE — Patient Instructions (Signed)
  Standing, pull red band back with your R hand (keep elbow straight) as well as squeezing your shoulder blade comfortably.   Then tip-toe with your right foot.   Hold for 5 seconds.  Repeat 10 times.  Do 3 sets daily.
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
## 2016-03-02 ENCOUNTER — Ambulatory Visit: Payer: BLUE CROSS/BLUE SHIELD

## 2016-03-04 ENCOUNTER — Ambulatory Visit: Payer: BLUE CROSS/BLUE SHIELD

## 2016-03-04 DIAGNOSIS — M546 Pain in thoracic spine: Secondary | ICD-10-CM

## 2016-03-04 DIAGNOSIS — M791 Myalgia: Secondary | ICD-10-CM | POA: Diagnosis not present

## 2016-03-04 DIAGNOSIS — R293 Abnormal posture: Secondary | ICD-10-CM

## 2016-03-04 NOTE — Patient Instructions (Signed)
   Hold tubing in right hand, arm out. Pull arm to your side. Do not twist or rotate trunk.  Hold for 5 seconds. Repeat ___10_ times per set. Do 2-3____ sets per session. Do _1___ sessions per day.  http://orth.exer.us/834   Copyright  VHI. All rights reserved.
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
## 2016-03-09 ENCOUNTER — Ambulatory Visit: Payer: BLUE CROSS/BLUE SHIELD | Attending: Family Medicine

## 2016-03-09 DIAGNOSIS — R293 Abnormal posture: Secondary | ICD-10-CM | POA: Diagnosis present

## 2016-03-09 DIAGNOSIS — M546 Pain in thoracic spine: Secondary | ICD-10-CM | POA: Insufficient documentation
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
## 2016-03-11 ENCOUNTER — Ambulatory Visit: Payer: BLUE CROSS/BLUE SHIELD

## 2016-03-13 ENCOUNTER — Other Ambulatory Visit: Payer: Self-pay | Admitting: Family Medicine

## 2016-03-15 NOTE — Telephone Encounter (Signed)
Prescribed on 02/04/16 for Pain - trapezius region and around shoulder blade (R) by Dr.Cook. Please advise?

## 2016-03-15 NOTE — Telephone Encounter (Signed)
refilled 

## 2016-04-07 ENCOUNTER — Other Ambulatory Visit: Payer: Self-pay | Admitting: Internal Medicine

## 2016-04-10 ENCOUNTER — Other Ambulatory Visit: Payer: Self-pay | Admitting: Internal Medicine

## 2016-05-17 ENCOUNTER — Other Ambulatory Visit: Payer: Self-pay | Admitting: Orthopedic Surgery

## 2016-05-17 DIAGNOSIS — M25562 Pain in left knee: Secondary | ICD-10-CM

## 2016-05-17 DIAGNOSIS — M1712 Unilateral primary osteoarthritis, left knee: Secondary | ICD-10-CM

## 2016-05-24 ENCOUNTER — Ambulatory Visit (HOSPITAL_COMMUNITY)
Admission: RE | Admit: 2016-05-24 | Discharge: 2016-05-24 | Disposition: A | Discharge status: Home / Self Care | Payer: BLUE CROSS/BLUE SHIELD | Source: Ambulatory Visit | Attending: Orthopedic Surgery | Admitting: Orthopedic Surgery

## 2016-05-24 DIAGNOSIS — M1712 Unilateral primary osteoarthritis, left knee: Secondary | ICD-10-CM | POA: Diagnosis not present

## 2016-05-24 DIAGNOSIS — M25562 Pain in left knee: Secondary | ICD-10-CM | POA: Diagnosis not present

## 2016-05-24 DIAGNOSIS — X58XXXA Exposure to other specified factors, initial encounter: Secondary | ICD-10-CM | POA: Diagnosis not present

## 2016-05-24 DIAGNOSIS — S83282A Other tear of lateral meniscus, current injury, left knee, initial encounter: Secondary | ICD-10-CM | POA: Diagnosis not present

## 2016-05-31 ENCOUNTER — Ambulatory Visit: Payer: BLUE CROSS/BLUE SHIELD

## 2016-06-21 ENCOUNTER — Encounter
Admission: RE | Admit: 2016-06-21 | Discharge: 2016-06-21 | Disposition: A | Discharge status: Home / Self Care | Payer: BLUE CROSS/BLUE SHIELD | Source: Ambulatory Visit | Attending: Orthopedic Surgery | Admitting: Orthopedic Surgery

## 2016-06-21 DIAGNOSIS — Z01810 Encounter for preprocedural cardiovascular examination: Secondary | ICD-10-CM | POA: Diagnosis present

## 2016-06-21 HISTORY — DX: Other complications of anesthesia, initial encounter: T88.59XA

## 2016-06-21 HISTORY — DX: Adverse effect of unspecified anesthetic, initial encounter: T41.45XA

## 2016-06-21 NOTE — Pre-Procedure Instructions (Addendum)
Pt brought in recent CBC and Met B, placed on chart.

## 2016-06-21 NOTE — Patient Instructions (Signed)
  Your procedure is scheduled [id number] August 24 , 2017. Report to Same Day Surgery. To find out your arrival time please call [phone] between 1PM - 3PM on Wednesday June 30, 2016.  Remember: Instructions that are not followed completely may result in serious medical risk, up to and including death, or upon the discretion of your surgeon and anesthesiologist your surgery may need to be rescheduled.    _x___ 1. Do not eat food or drink liquids after midnight. No gum chewing or hard candies.     _x___ 2. No Alcohol for 24 hours before or after surgery.   ____ 3. Bring all medications with you on the day of surgery if instructed.    __x__ 4. Notify your doctor if there is any change in your medical condition     (cold, fever, infections).     Do not wear jewelry, make-up, hairpins, clips or nail polish.  Do not wear lotions, powders, or perfumes. You may wear deodorant.  Do not shave 48 hours prior to surgery. Men may shave face and neck.  Do not bring valuables to the hospital.    [facility] is not responsible for any belongings or valuables.               Contacts, dentures or bridgework may not be worn into surgery.  Leave your suitcase in the car. After surgery it may be brought to your room.  For patients admitted to the hospital, discharge time is determined by your treatment team.   Patients discharged the day of surgery will not be allowed to drive home.    Please read over the following fact sheets that you were given:   [facility] Preparing for Surgery  _x___ Take these medicines the morning of surgery with A SIP OF WATER:    1. pantoprazole (PROTONIX) please take an extra dose the night prior to surgery and again am of surgery.    ____ Fleet Enema (as directed)   _x__ Use CHG Soap as directed on instruction sheet  ____ Use inhalers on the day of surgery and bring to hospital day of surgery  ____ Stop metformin 2 days prior to surgery    ____ Take 1/2 of  usual insulin dose the night before surgery and none on the morning of surgery.   _x___ Stop aspirin 7 days prior to surgery.  _x___ Stop Anti-inflammatories such as Advil, Aleve, Ibuprofen, Motrin, Naproxen, Naprosyn, Goodies powders or aspirin products.   _x___ Stop supplements Vitamin D until after surgery.    ____ Bring C-Pap to the hospital.

## 2016-07-01 ENCOUNTER — Encounter
Admission: AD | Disposition: A | Discharge status: Home / Self Care | Payer: Self-pay | Source: Ambulatory Visit | Attending: Orthopedic Surgery

## 2016-07-01 ENCOUNTER — Inpatient Hospital Stay: Payer: BLUE CROSS/BLUE SHIELD | Admitting: Anesthesiology

## 2016-07-01 ENCOUNTER — Ambulatory Visit
Admission: AD | Admit: 2016-07-01 | Discharge: 2016-07-01 | Disposition: A | Discharge status: Home / Self Care | Payer: BLUE CROSS/BLUE SHIELD | Source: Ambulatory Visit | Attending: Orthopedic Surgery | Admitting: Orthopedic Surgery

## 2016-07-01 ENCOUNTER — Encounter: Payer: Self-pay | Admitting: *Deleted

## 2016-07-01 DIAGNOSIS — Z888 Allergy status to other drugs, medicaments and biological substances status: Secondary | ICD-10-CM | POA: Insufficient documentation

## 2016-07-01 DIAGNOSIS — S83232A Complex tear of medial meniscus, current injury, left knee, initial encounter: Secondary | ICD-10-CM | POA: Diagnosis present

## 2016-07-01 DIAGNOSIS — Z792 Long term (current) use of antibiotics: Secondary | ICD-10-CM | POA: Insufficient documentation

## 2016-07-01 DIAGNOSIS — X501XXA Overexertion from prolonged static or awkward postures, initial encounter: Secondary | ICD-10-CM | POA: Insufficient documentation

## 2016-07-01 DIAGNOSIS — Z8249 Family history of ischemic heart disease and other diseases of the circulatory system: Secondary | ICD-10-CM | POA: Diagnosis not present

## 2016-07-01 DIAGNOSIS — Z833 Family history of diabetes mellitus: Secondary | ICD-10-CM | POA: Insufficient documentation

## 2016-07-01 DIAGNOSIS — Z9889 Other specified postprocedural states: Secondary | ICD-10-CM | POA: Insufficient documentation

## 2016-07-01 DIAGNOSIS — K219 Gastro-esophageal reflux disease without esophagitis: Secondary | ICD-10-CM | POA: Insufficient documentation

## 2016-07-01 DIAGNOSIS — I10 Essential (primary) hypertension: Secondary | ICD-10-CM | POA: Diagnosis not present

## 2016-07-01 DIAGNOSIS — E785 Hyperlipidemia, unspecified: Secondary | ICD-10-CM | POA: Diagnosis not present

## 2016-07-01 DIAGNOSIS — E669 Obesity, unspecified: Secondary | ICD-10-CM | POA: Insufficient documentation

## 2016-07-01 DIAGNOSIS — Z91013 Allergy to seafood: Secondary | ICD-10-CM | POA: Insufficient documentation

## 2016-07-01 DIAGNOSIS — Z9109 Other allergy status, other than to drugs and biological substances: Secondary | ICD-10-CM | POA: Diagnosis not present

## 2016-07-01 HISTORY — PX: KNEE ARTHROSCOPY WITH MEDIAL MENISECTOMY: SHX5651

## 2016-07-01 SURGERY — ARTHROSCOPY, KNEE, WITH MEDIAL MENISCECTOMY
Anesthesia: General | Site: Knee | Laterality: Left | Wound class: Clean

## 2016-07-01 MED ORDER — FENTANYL CITRATE (PF) 100 MCG/2ML IJ SOLN
INTRAMUSCULAR | Status: DC | PRN
  Administered 2016-07-01 (×2): 50 ug via INTRAVENOUS

## 2016-07-01 MED ORDER — HYDROCODONE-ACETAMINOPHEN 5-325 MG PO TABS
1.0000 | ORAL_TABLET | ORAL | Status: DC | PRN
  Administered 2016-07-01: 1 via ORAL

## 2016-07-01 MED ORDER — OXYCODONE HCL 5 MG PO TABS: 5.0000 mg | ORAL_TABLET | ORAL | Status: DC | PRN

## 2016-07-01 MED ORDER — MEPERIDINE HCL 25 MG/ML IJ SOLN: 6.2500 mg | INTRAMUSCULAR | Status: DC | PRN

## 2016-07-01 MED ORDER — LIDOCAINE HCL (CARDIAC) 20 MG/ML IV SOLN
INTRAVENOUS | Status: DC | PRN
  Administered 2016-07-01: 100 mg via INTRAVENOUS

## 2016-07-01 MED ORDER — SODIUM CHLORIDE 0.9 % IV SOLN: INTRAVENOUS | Status: DC

## 2016-07-01 MED ORDER — ONDANSETRON HCL 4 MG PO TABS: 4.0000 mg | ORAL_TABLET | Freq: Four times a day (QID) | ORAL | Status: DC | PRN

## 2016-07-01 MED ORDER — METOCLOPRAMIDE HCL 5 MG/ML IJ SOLN: 5.0000 mg | Freq: Three times a day (TID) | INTRAMUSCULAR | Status: DC | PRN

## 2016-07-01 MED ORDER — LACTATED RINGERS IV SOLN
INTRAVENOUS | Status: DC
  Administered 2016-07-01: 09:00:00 via INTRAVENOUS

## 2016-07-01 MED ORDER — OXYCODONE HCL 5 MG/5ML PO SOLN: 5.0000 mg | Freq: Once | ORAL | Status: DC | PRN

## 2016-07-01 MED ORDER — PROMETHAZINE HCL 25 MG/ML IJ SOLN: 6.2500 mg | INTRAMUSCULAR | Status: DC | PRN

## 2016-07-01 MED ORDER — HYDROCODONE-ACETAMINOPHEN 5-325 MG PO TABS
ORAL_TABLET | ORAL | Status: AC
  Filled 2016-07-01: qty 1

## 2016-07-01 MED ORDER — ONDANSETRON HCL 4 MG/2ML IJ SOLN
INTRAMUSCULAR | Status: DC | PRN
  Administered 2016-07-01: 4 mg via INTRAVENOUS

## 2016-07-01 MED ORDER — HYDROCODONE-ACETAMINOPHEN 5-325 MG PO TABS: 1.0000 | ORAL_TABLET | Freq: Four times a day (QID) | ORAL | 0 refills | Status: DC | PRN

## 2016-07-01 MED ORDER — METOCLOPRAMIDE HCL 10 MG PO TABS: 5.0000 mg | ORAL_TABLET | Freq: Three times a day (TID) | ORAL | Status: DC | PRN

## 2016-07-01 MED ORDER — BUPIVACAINE-EPINEPHRINE (PF) 0.5% -1:200000 IJ SOLN
INTRAMUSCULAR | Status: DC | PRN
  Administered 2016-07-01: 20 mL

## 2016-07-01 MED ORDER — OXYCODONE HCL 5 MG PO TABS: 5.0000 mg | ORAL_TABLET | Freq: Once | ORAL | Status: DC | PRN

## 2016-07-01 MED ORDER — ONDANSETRON HCL 4 MG/2ML IJ SOLN: 4.0000 mg | Freq: Four times a day (QID) | INTRAMUSCULAR | Status: DC | PRN

## 2016-07-01 MED ORDER — PROPOFOL 10 MG/ML IV BOLUS
INTRAVENOUS | Status: DC | PRN
  Administered 2016-07-01: 200 mg via INTRAVENOUS

## 2016-07-01 MED ORDER — MIDAZOLAM HCL 2 MG/2ML IJ SOLN
INTRAMUSCULAR | Status: DC | PRN
  Administered 2016-07-01: 2 mg via INTRAVENOUS

## 2016-07-01 MED ORDER — FENTANYL CITRATE (PF) 100 MCG/2ML IJ SOLN
25.0000 ug | INTRAMUSCULAR | Status: DC | PRN
  Administered 2016-07-01 (×4): 25 ug via INTRAVENOUS

## 2016-07-01 MED ORDER — BUPIVACAINE-EPINEPHRINE (PF) 0.5% -1:200000 IJ SOLN
INTRAMUSCULAR | Status: AC
  Filled 2016-07-01: qty 30

## 2016-07-01 MED ORDER — FENTANYL CITRATE (PF) 100 MCG/2ML IJ SOLN
INTRAMUSCULAR | Status: AC
  Administered 2016-07-01: 25 ug via INTRAVENOUS
  Filled 2016-07-01: qty 2

## 2016-07-01 SURGICAL SUPPLY — 28 items
BANDAGE ACE 4X5 VEL STRL LF (GAUZE/BANDAGES/DRESSINGS) ×2 IMPLANT
BANDAGE ELASTIC 4 LF NS (GAUZE/BANDAGES/DRESSINGS) ×2 IMPLANT
BLADE FULL RADIUS 3.5 (BLADE) IMPLANT
BLADE INCISOR PLUS 4.5 (BLADE) IMPLANT
BLADE SHAVER 4.5 DBL SERAT CV (CUTTER) IMPLANT
BLADE SHAVER 4.5X7 STR FR (MISCELLANEOUS) IMPLANT
CHLORAPREP W/TINT 26ML (MISCELLANEOUS) ×2 IMPLANT
CUFF TOURN 24 STER (MISCELLANEOUS) IMPLANT
CUFF TOURN 30 STER DUAL PORT (MISCELLANEOUS) ×2 IMPLANT
DRAPE C-ARM XRAY 36X54 (DRAPES) ×2 IMPLANT
GAUZE SPONGE 4X4 12PLY STRL (GAUZE/BANDAGES/DRESSINGS) ×2 IMPLANT
GLOVE SURG ORTHO 9.0 STRL STRW (GLOVE) ×2 IMPLANT
GOWN SRG 2XL LVL 4 RGLN SLV (GOWNS) ×1 IMPLANT
GOWN STRL NON-REIN 2XL LVL4 (GOWNS) ×1
GOWN STRL REUS W/ TWL LRG LVL3 (GOWN DISPOSABLE) ×1 IMPLANT
GOWN STRL REUS W/TWL LRG LVL3 (GOWN DISPOSABLE) ×1
IV LACTATED RINGER IRRG 3000ML (IV SOLUTION) ×2
IV LR IRRIG 3000ML ARTHROMATIC (IV SOLUTION) ×2 IMPLANT
KIT RM TURNOVER STRD PROC AR (KITS) ×2 IMPLANT
MANIFOLD NEPTUNE II (INSTRUMENTS) ×2 IMPLANT
PACK ARTHROSCOPY KNEE (MISCELLANEOUS) ×2 IMPLANT
SET TUBE SUCT SHAVER OUTFL 24K (TUBING) ×2 IMPLANT
SET TUBE TIP INTRA-ARTICULAR (MISCELLANEOUS) ×2 IMPLANT
SUT ETHILON 4-0 (SUTURE) ×1
SUT ETHILON 4-0 FS2 18XMFL BLK (SUTURE) ×1
SUTURE ETHLN 4-0 FS2 18XMF BLK (SUTURE) ×1 IMPLANT
TUBING ARTHRO INFLOW-ONLY STRL (TUBING) ×2 IMPLANT
WAND HAND CNTRL MULTIVAC 50 (MISCELLANEOUS) ×2 IMPLANT

## 2016-07-01 NOTE — Discharge Instructions (Signed)
Weightbearing as tolerated to left leg. Take one aspirin a day either 81 mg or 325 mg until walking normally. If bandage slides down the leg remove entire bandage and covered to incisions with a Band-Aid over each plus the Ace wrap. Keep leg clean and dry this weekend  [place]   1) The drugs that you were given will stay in your system until tomorrow so for the next 24 hours you should not:  A) Drive an automobile B) Make any legal decisions C) Drink any alcoholic beverage   2) You may resume regular meals tomorrow.  Today it is better to start with liquids and gradually work up to solid foods.  You may eat anything you prefer, but it is better to start with liquids, then soup and crackers, and gradually work up to solid foods.   3) Please notify your doctor immediately if you have any unusual bleeding, trouble breathing, redness and pain at the surgery site, drainage, fever, or pain not relieved by medication.    4) Additional Instructions:        Please contact your physician with any problems or Same Day Surgery at [phone], Monday through Friday 6 am to 4 pm, or [place] [facility] number at [phone].

## 2016-07-01 NOTE — Op Note (Signed)
07/01/2016  11:20 AM  PATIENT:  Christopher Vazquez  50 y.o. male  PRE-OPERATIVE DIAGNOSIS:  COMPLEX TEAR OF MEDIAL MENISCUS  POST-OPERATIVE DIAGNOSIS:  COMPLEX TEAR OF MEDIAL MENISCUS  PROCEDURE:  Procedure(s): left knee arthroscopy with partial medial menisectomy (Left)  SURGEON: Laurene Footman, MD  ASSISTANTS: None  ANESTHESIA:   general  EBL:  Total I/O In: 700 [I.V.:700] Out: 5 [Blood:5]  BLOOD ADMINISTERED:none  DRAINS: none   LOCAL MEDICATIONS USED:  MARCAINE     SPECIMEN:  No Specimen  DISPOSITION OF SPECIMEN:  N/A  COUNTS:  YES  TOURNIQUET:    IMPLANTS: None  DICTATION: .Dragon Dictation patient brought the operating room and after adequate anesthesia was obtained the left leg is prepped draped in sterile fashion with a arthroscopic leg holder and tourniquet applied. After patient identification and timeout procedures were completed, an inferior lateral portal was made. Initial inspection revealed minimal degenerative changes on the patella and trochlea.  Coming around medially an inferior medial portal was made and there was minimal cartilage changes to the medial femoral condyle. the posterior horn of the meniscus had a complex tear with vertical and horizontal components which was debrided back to a stable margin resecting approximately half of the posterior third. An ArthroCare wand was used as well as meniscal punch to debride this tear. The anterior cruciate ligament was intact and lateral compartment was normal.  After thorough irrigation of the joint argentation was withdrawn and the wounds closed with simple interrupted 4-0 nylon skin suture. 10 cc of half percent Sensorcaine was injected into each of the portals for postop analgesia. Xeroform 4 x 4 web roll and Ace wrap applied  PLAN OF CARE: Discharge to home after PACU  PATIENT DISPOSITION:  PACU - hemodynamically stable.

## 2016-07-01 NOTE — Anesthesia Postprocedure Evaluation (Signed)
Anesthesia Post Note  Patient: Christopher Vazquez  Procedure(s) Performed: Procedure(s) (LRB): left knee arthroscopy with partial medial menisectomy (Left)  Patient location during evaluation: PACU Anesthesia Type: General Level of consciousness: awake and alert and oriented Pain management: pain level controlled Vital Signs Assessment: post-procedure vital signs reviewed and stable Respiratory status: spontaneous breathing, nonlabored ventilation and respiratory function stable Cardiovascular status: blood pressure returned to baseline and stable Postop Assessment: no signs of nausea or vomiting Anesthetic complications: no    Last Vitals:  Vitals:   07/01/16 1040 07/01/16 1051  BP: 124/80 (!) 141/76  Pulse: 84 80  Resp: 15 16  Temp: 36.7 C 36.4 C    Last Pain:  Vitals:   07/01/16 1051  TempSrc: Oral  PainSc:                  Lottie Sigman

## 2016-07-01 NOTE — H&P (Signed)
Reviewed paper H+P, will be scanned into chart. No changes noted.  

## 2016-07-01 NOTE — Transfer of Care (Signed)
Immediate Anesthesia Transfer of Care Note  Patient: Christopher Vazquez  Procedure(s) Performed: Procedure(s): left knee arthroscopy with partial medial menisectomy (Left)  Patient Location: PACU  Anesthesia Type:General  Level of Consciousness: sedated and responds to stimulation  Airway & Oxygen Therapy: Patient Spontanous Breathing and Patient connected to face mask oxygen  Post-op Assessment: Report given to RN and Post -op Vital signs reviewed and stable  Post vital signs: Reviewed and stable  Last Vitals:  Vitals:   07/01/16 0957 07/01/16 1000  BP: 121/75 121/75  Pulse: 87 100  Resp: (!) 23 16  Temp: 36.2 C     Last Pain:  Vitals:   07/01/16 0748  TempSrc: Oral  PainSc: 4          Complications: No apparent anesthesia complications

## 2016-07-01 NOTE — Anesthesia Procedure Notes (Signed)
Procedure Name: LMA Insertion Performed by: Schwanda Zima Pre-anesthesia Checklist: Patient identified, Patient being monitored, Timeout performed, Emergency Drugs available and Suction available Patient Re-evaluated:Patient Re-evaluated prior to inductionOxygen Delivery Method: Circle system utilized Preoxygenation: Pre-oxygenation with 100% oxygen Intubation Type: IV induction Ventilation: Mask ventilation without difficulty LMA: LMA inserted LMA Size: 4.0 Tube type: Oral Number of attempts: 1 Placement Confirmation: positive ETCO2 and breath sounds checked- equal and bilateral Tube secured with: Tape Dental Injury: Teeth and Oropharynx as per pre-operative assessment        

## 2016-07-01 NOTE — Anesthesia Preprocedure Evaluation (Signed)
Anesthesia Evaluation  Patient identified by MRN, date of birth, ID band Patient awake    Reviewed: Allergy & Precautions, NPO status , Patient's Chart, lab work & pertinent test results  History of Anesthesia Complications History of anesthetic complications: had laryngospasm on extubation during tonsillectomy.  Airway Mallampati: II  TM Distance: >3 FB Neck ROM: Full    Dental no notable dental hx.    Pulmonary neg pulmonary ROS, neg sleep apnea, neg COPD,    breath sounds clear to auscultation- rhonchi (-) wheezing      Cardiovascular Exercise Tolerance: Good hypertension, Pt. on medications (-) CAD and (-) Past MI  Rhythm:Regular Rate:Normal - Systolic murmurs and - Diastolic murmurs    Neuro/Psych negative neurological ROS  negative psych ROS   GI/Hepatic Neg liver ROS, GERD  ,  Endo/Other  negative endocrine ROSneg diabetes  Renal/GU negative Renal ROS     Musculoskeletal negative musculoskeletal ROS (+)   Abdominal (+) + obese,   Peds  Hematology negative hematology ROS (+)   Anesthesia Other Findings Past Medical History: No date: Complication of anesthesia     Comment: with tonsillectomy sever swelling of throat,               possible laryngiospasm. No date: GERD (gastroesophageal reflux disease) No date: Hyperlipidemia No date: Hypertension   Reproductive/Obstetrics                             Anesthesia Physical Anesthesia Plan  ASA: II  Anesthesia Plan: General   Post-op Pain Management:    Induction: Intravenous  Airway Management Planned: LMA  Additional Equipment:   Intra-op Plan:   Post-operative Plan:   Informed Consent: I have reviewed the patients History and Physical, chart, labs and discussed the procedure including the risks, benefits and alternatives for the proposed anesthesia with the patient or authorized representative who has indicated  his/her understanding and acceptance.   Dental advisory given  Plan Discussed with: CRNA and Anesthesiologist  Anesthesia Plan Comments:         Anesthesia Quick Evaluation

## 2016-07-02 ENCOUNTER — Encounter: Payer: Self-pay | Admitting: Orthopedic Surgery

## 2016-07-02 ENCOUNTER — Other Ambulatory Visit: Payer: Self-pay

## 2016-07-02 MED ORDER — PANTOPRAZOLE SODIUM 40 MG PO TBEC: DELAYED_RELEASE_TABLET | ORAL | 2 refills | Status: DC

## 2016-08-11 ENCOUNTER — Ambulatory Visit: Payer: Self-pay | Admitting: Internal Medicine

## 2016-08-28 ENCOUNTER — Other Ambulatory Visit: Payer: Self-pay | Admitting: Internal Medicine

## 2016-08-30 NOTE — Telephone Encounter (Signed)
Last OV 4/17 cannot find recent Vit D level please advise OK to fill?

## 2016-11-11 ENCOUNTER — Other Ambulatory Visit: Payer: Self-pay | Admitting: Internal Medicine

## 2016-11-17 ENCOUNTER — Other Ambulatory Visit: Payer: Self-pay | Admitting: Internal Medicine

## 2017-01-13 LAB — HEPATIC FUNCTION PANEL
ALK PHOS: 51 U/L (ref 25–125)
ALT: 44 U/L — AB (ref 10–40)
AST: 32 U/L (ref 14–40)
BILIRUBIN, TOTAL: 0.3 mg/dL

## 2017-01-13 LAB — BASIC METABOLIC PANEL
BUN: 16 mg/dL (ref 4–21)
Creatinine: 1.2 mg/dL (ref 0.6–1.3)
GLUCOSE: 110 mg/dL
POTASSIUM: 4.3 mmol/L (ref 3.4–5.3)
SODIUM: 139 mmol/L (ref 137–147)

## 2017-01-13 LAB — LIPID PANEL
Cholesterol: 110 mg/dL (ref 0–200)
HDL: 28 mg/dL — AB (ref 35–70)
LDL CALC: 54 mg/dL
Triglycerides: 138 mg/dL (ref 40–160)

## 2017-01-13 LAB — CBC AND DIFFERENTIAL
HCT: 42 % (ref 41–53)
HEMOGLOBIN: 14.3 g/dL (ref 13.5–17.5)
NEUTROS ABS: 2 /uL
PLATELETS: 255 10*3/uL (ref 150–399)
WBC: 5.2 10^3/mL

## 2017-01-13 LAB — VITAMIN D 25 HYDROXY (VIT D DEFICIENCY, FRACTURES): Vit D, 25-Hydroxy: 17.8

## 2017-01-13 LAB — TSH: TSH: 2.18 u[IU]/mL (ref 0.41–5.90)

## 2017-01-13 LAB — HEMOGLOBIN A1C: HEMOGLOBIN A1C: 6.4

## 2017-01-18 ENCOUNTER — Telehealth: Payer: Self-pay | Admitting: Internal Medicine

## 2017-01-18 ENCOUNTER — Other Ambulatory Visit: Payer: Self-pay

## 2017-01-18 DIAGNOSIS — R7303 Prediabetes: Secondary | ICD-10-CM

## 2017-01-18 DIAGNOSIS — E559 Vitamin D deficiency, unspecified: Secondary | ICD-10-CM

## 2017-01-18 MED ORDER — VITAMIN D (ERGOCALCIFEROL) 1.25 MG (50000 UNIT) PO CAPS: 50000.0000 [IU] | ORAL_CAPSULE | ORAL | 3 refills | Status: DC

## 2017-01-25 ENCOUNTER — Encounter: Payer: Self-pay | Admitting: Internal Medicine

## 2017-01-25 ENCOUNTER — Ambulatory Visit (INDEPENDENT_AMBULATORY_CARE_PROVIDER_SITE_OTHER): Payer: BLUE CROSS/BLUE SHIELD | Admitting: Internal Medicine

## 2017-01-25 VITALS — BP 112/74 | HR 84 | Resp 16 | Ht 66.5 in | Wt 225.8 lb

## 2017-01-25 DIAGNOSIS — E6609 Other obesity due to excess calories: Secondary | ICD-10-CM | POA: Diagnosis not present

## 2017-01-25 DIAGNOSIS — Z1211 Encounter for screening for malignant neoplasm of colon: Secondary | ICD-10-CM | POA: Diagnosis not present

## 2017-01-25 DIAGNOSIS — R0601 Orthopnea: Secondary | ICD-10-CM | POA: Diagnosis not present

## 2017-01-25 DIAGNOSIS — Z6833 Body mass index (BMI) 33.0-33.9, adult: Secondary | ICD-10-CM | POA: Diagnosis not present

## 2017-01-25 DIAGNOSIS — K760 Fatty (change of) liver, not elsewhere classified: Secondary | ICD-10-CM | POA: Diagnosis not present

## 2017-01-25 DIAGNOSIS — R7303 Prediabetes: Secondary | ICD-10-CM

## 2017-01-25 DIAGNOSIS — I10 Essential (primary) hypertension: Secondary | ICD-10-CM

## 2017-01-25 MED ORDER — CIPROFLOXACIN HCL 500 MG PO TABS: 500.0000 mg | ORAL_TABLET | Freq: Two times a day (BID) | ORAL | 0 refills | Status: DC

## 2017-01-25 MED ORDER — ONDANSETRON 8 MG PO TBDP
8.0000 mg | ORAL_TABLET | Freq: Three times a day (TID) | ORAL | 0 refills | Status: DC | PRN
Start: 2017-01-25 — End: 2017-10-21

## 2017-01-25 NOTE — Progress Notes (Signed)
Pre visit review using our clinic review tool, if applicable. No additional management support is needed unless otherwise documented below in the visit note. 

## 2017-01-25 NOTE — Progress Notes (Signed)
Subjective:  Patient ID: Christopher Vazquez, male    DOB: [birth year]  Age: 51 y.o. MRN: [id number]  CC: The primary encounter diagnosis was Orthopnea. Diagnoses of Fatty liver, Colon cancer screening, Essential hypertension, Hepatic steatosis, Prediabetes, and Class 1 obesity due to excess calories without serious comorbidity with body mass index (BMI) of 33.0 to 33.9 in adult were also pertinent to this visit.  HPI Christopher Vazquez presents for follow up on hypertension and hyperlipidemia  Labs reviewed.  Prediabetes addressed  a1c 6.4   Vit D 17 Cbc , thyroid normal  Lipids normal   Alt elevated at 44 (41 previously)  Had knee surgery left in August by Rudene Christians for multiple ligament tears.   Didn't feel better until November ,  Then as he became more active it has been hurting more .pain is better now.   Using treadmill,  Advised to get a stationery bike.    Outpatient Medications Prior to Visit  Medication Sig Dispense Refill  . aspirin 81 MG tablet Take 81 mg by mouth at bedtime.     Marland Kitchen CIALIS 20 MG tablet TAKE 1 TABLET (20 MG TOTAL) BY MOUTH DAILY AS NEEDED FOR ERECTILE DYSFUNCTION. 6 tablet 8  . enalapril-hydrochlorothiazide (VASERETIC) 10-25 MG tablet TAKE 1 TABLET BY MOUTH DAILY. 90 tablet 1  . fenofibrate 160 MG tablet Take 1 tablet (160 mg total) by mouth daily. NEEDS TO SCHEDULE OFFICE VISIT WITH PCP FOR FURTHER REFILLS 90 tablet 0  . pantoprazole (PROTONIX) 40 MG tablet Take 1 tablet (40 mg total) by mouth daily. Needs to schedule appt for next refill 30 tablet 2  . simvastatin (ZOCOR) 20 MG tablet TAKE 1 TABLET BY MOUTH AT BEDTIME 90 tablet 1  . Vitamin D, Ergocalciferol, (DRISDOL) 50000 units CAPS capsule Take 1 capsule (50,000 Units total) by mouth every 7 (seven) days. 12 capsule 3  . HYDROcodone-acetaminophen (NORCO) 5-325 MG tablet Take 1 tablet by mouth every 6 (six) hours as needed for moderate pain. (Patient not taking: Reported on 01/25/2017) 30 tablet 0  . ibuprofen  (ADVIL,MOTRIN) 800 MG tablet Take 800 mg by mouth every 8 (eight) hours as needed.     No facility-administered medications prior to visit.     Review of Systems;  Patient denies headache, fevers, malaise, unintentional weight loss, skin rash, eye pain, sinus congestion and sinus pain, sore throat, dysphagia,  hemoptysis , cough, dyspnea, wheezing, chest pain, palpitations, orthopnea, edema, abdominal pain, nausea, melena, diarrhea, constipation, flank pain, dysuria, hematuria, urinary  Frequency, nocturia, numbness, tingling, seizures,  Focal weakness, Loss of consciousness,  Tremor, insomnia, depression, anxiety, and suicidal ideation.      Objective:  BP 112/74 (BP Location: Left Arm, Patient Position: Sitting, Cuff Size: Large)   Pulse 84   Resp 16   Ht 5' 6.5" (1.689 m)   Wt 225 lb 12.8 oz (102.4 kg)   SpO2 95%   BMI 35.90 kg/m   BP Readings from Last 3 Encounters:  01/25/17 112/74  07/01/16 123/76  06/21/16 121/81    Wt Readings from Last 3 Encounters:  01/25/17 225 lb 12.8 oz (102.4 kg)  06/21/16 210 lb (95.3 kg)  02/09/16 204 lb 12 oz (92.9 kg)    General appearance: alert, cooperative and appears stated age Ears: normal TM's and external ear canals both ears Throat: lips, mucosa, and tongue normal; teeth and gums normal Neck: no adenopathy, no carotid bruit, supple, symmetrical, trachea midline and thyroid not enlarged, symmetric, no tenderness/mass/nodules Back: symmetric,  no curvature. ROM normal. No CVA tenderness. Lungs: clear to auscultation bilaterally Heart: regular rate and rhythm, S1, S2 normal, no murmur, click, rub or gallop Abdomen: soft, non-tender; bowel sounds normal; no masses,  no organomegaly Pulses: 2+ and symmetric Skin: Skin color, texture, turgor normal. No rashes or lesions Lymph nodes: Cervical, supraclavicular, and axillary nodes normal.  Lab Results  Component Value Date   HGBA1C 6.4 01/13/2017   HGBA1C 5.9 11/14/2015   HGBA1C 6.2  (A) 08/13/2015    Lab Results  Component Value Date   CREATININE 1.2 01/13/2017   CREATININE 1.1 11/14/2015   CREATININE 1.2 08/13/2015    Lab Results  Component Value Date   WBC 5.2 01/13/2017   HGB 14.3 01/13/2017   HCT 42 01/13/2017   PLT 255 01/13/2017   CHOL 110 01/13/2017   TRIG 138 01/13/2017   HDL 28 (A) 01/13/2017   LDLCALC 54 01/13/2017   ALT 44 (A) 01/13/2017   AST 32 01/13/2017   NA 139 01/13/2017   K 4.3 01/13/2017   CREATININE 1.2 01/13/2017   BUN 16 01/13/2017   TSH 2.18 01/13/2017   HGBA1C 6.4 01/13/2017    No results found.  Assessment & Plan:   Problem List Items Addressed This Visit    Essential hypertension    Well controlled on current regimen. Renal function stable, no changes today.  Lab Results  Component Value Date   CREATININE 1.2 01/13/2017   Lab Results  Component Value Date   NA 139 01/13/2017   K 4.3 01/13/2017         Hepatic steatosis    Hepatic enzymes are nearly normalized and he has no signs of cirrhosis or synthetic dysfunction .  continue low glycemic index diet, renew efforts to get  BMI < 30 ,  and management of IPG and  hyperlipidemia  Lab Results  Component Value Date   ALT 44 (A) 01/13/2017   AST 32 01/13/2017   ALKPHOS 51 01/13/2017           Obesity    I have addressed  BMI and recommended a low glycemic index diet utilizing smaller more frequent meals to increase metabolism.  I have also recommended that patient start exercising with a goal of 30 minutes of aerobic exercise a minimum of 5 days per week.       Prediabetes    Greater than half of this 30 minute visit was spent reviewing diet and exercise and counselling patient .  Low GI diet and increased participation in aerobic exercise recommended.          Other Visit Diagnoses    Orthopnea    -  Primary   Fatty liver       Relevant Orders   US Abdomen Limited RUQ   Colon cancer screening       Relevant Orders   Ambulatory referral to  Gastroenterology      I have discontinued Mr. Amedee ibuprofen and HYDROcodone-acetaminophen. I am also having him start on ciprofloxacin. Additionally, I am having him maintain his aspirin, CIALIS, enalapril-hydrochlorothiazide, simvastatin, fenofibrate, pantoprazole, Vitamin D (Ergocalciferol), and ondansetron.  Meds ordered this encounter  Medications  . ondansetron (ZOFRAN-ODT) 8 MG disintegrating tablet    Sig: Take 1 tablet (8 mg total) by mouth every 8 (eight) hours as needed for nausea or vomiting.    Dispense:  10 tablet    Refill:  0  . ciprofloxacin (CIPRO) 500 MG tablet    Sig: Take 1  tablet (500 mg total) by mouth 2 (two) times daily.    Dispense:  20 tablet    Refill:  0    Medications Discontinued During This Encounter  Medication Reason  . HYDROcodone-acetaminophen (NORCO) 5-325 MG tablet Patient has not taken in last 30 days  . ibuprofen (ADVIL,MOTRIN) 800 MG tablet Patient has not taken in last 30 days    Follow-up: Return in about 6 months (around 07/28/2017) for CPE, fasting labs prior .   Crecencio Mc, MD

## 2017-01-26 NOTE — Assessment & Plan Note (Signed)
Greater than half of this 30 minute visit was spent reviewing diet and exercise and counselling patient .  Low GI diet and increased participation in aerobic exercise recommended.

## 2017-01-26 NOTE — Assessment & Plan Note (Signed)
Well controlled on current regimen. Renal function stable, no changes today.  Lab Results  Component Value Date   CREATININE 1.2 01/13/2017   Lab Results  Component Value Date   NA 139 01/13/2017   K 4.3 01/13/2017

## 2017-01-26 NOTE — Assessment & Plan Note (Signed)
Hepatic enzymes are nearly normalized and he has no signs of cirrhosis or synthetic dysfunction .  continue low glycemic index diet, renew efforts to get  BMI < 30 ,  and management of IPG and  hyperlipidemia  Lab Results  Component Value Date   ALT 44 (A) 01/13/2017   AST 32 01/13/2017   ALKPHOS 51 01/13/2017

## 2017-01-26 NOTE — Assessment & Plan Note (Signed)
I have addressed  BMI and recommended a low glycemic index diet utilizing smaller more frequent meals to increase metabolism.  I have also recommended that patient start exercising with a goal of 30 minutes of aerobic exercise a minimum of 5 days per week.  

## 2017-01-28 ENCOUNTER — Other Ambulatory Visit: Payer: Self-pay | Admitting: Internal Medicine

## 2017-01-28 ENCOUNTER — Encounter: Payer: Self-pay | Admitting: Internal Medicine

## 2017-01-28 MED ORDER — MONTELUKAST SODIUM 10 MG PO TABS: 10.0000 mg | ORAL_TABLET | Freq: Every day | ORAL | 3 refills | Status: DC

## 2017-02-02 ENCOUNTER — Ambulatory Visit: Payer: BLUE CROSS/BLUE SHIELD

## 2017-02-05 ENCOUNTER — Other Ambulatory Visit: Payer: Self-pay | Admitting: Internal Medicine

## 2017-02-07 ENCOUNTER — Other Ambulatory Visit: Payer: Self-pay | Admitting: Internal Medicine

## 2017-02-27 ENCOUNTER — Other Ambulatory Visit: Payer: Self-pay | Admitting: Internal Medicine

## 2017-03-01 ENCOUNTER — Other Ambulatory Visit: Payer: Self-pay

## 2017-03-01 MED ORDER — MONTELUKAST SODIUM 10 MG PO TABS: 10.0000 mg | ORAL_TABLET | Freq: Every day | ORAL | 0 refills | Status: DC

## 2017-03-08 ENCOUNTER — Other Ambulatory Visit: Payer: Self-pay | Admitting: Internal Medicine

## 2017-03-09 ENCOUNTER — Other Ambulatory Visit: Payer: Self-pay | Admitting: Internal Medicine

## 2017-04-07 ENCOUNTER — Other Ambulatory Visit: Payer: Self-pay | Admitting: Internal Medicine

## 2017-04-24 ENCOUNTER — Other Ambulatory Visit: Payer: Self-pay | Admitting: Internal Medicine

## 2017-05-01 ENCOUNTER — Other Ambulatory Visit: Payer: Self-pay | Admitting: Internal Medicine

## 2017-05-06 ENCOUNTER — Other Ambulatory Visit: Payer: Self-pay | Admitting: Internal Medicine

## 2017-05-06 NOTE — Telephone Encounter (Signed)
Medication has been refilled.

## 2017-05-20 ENCOUNTER — Other Ambulatory Visit: Payer: Self-pay | Admitting: Internal Medicine

## 2017-05-31 ENCOUNTER — Other Ambulatory Visit: Payer: Self-pay | Admitting: Internal Medicine

## 2017-06-12 ENCOUNTER — Other Ambulatory Visit: Payer: Self-pay | Admitting: Internal Medicine

## 2017-06-22 ENCOUNTER — Other Ambulatory Visit: Payer: Self-pay | Admitting: Internal Medicine

## 2017-06-23 ENCOUNTER — Other Ambulatory Visit: Payer: Self-pay

## 2017-06-23 ENCOUNTER — Other Ambulatory Visit: Payer: Self-pay | Admitting: Internal Medicine

## 2017-06-23 MED ORDER — FENOFIBRATE 160 MG PO TABS: ORAL_TABLET | ORAL | 5 refills | Status: DC

## 2017-06-23 MED ORDER — FENOFIBRATE 160 MG PO TABS: ORAL_TABLET | ORAL | 1 refills | Status: DC

## 2017-06-23 MED ORDER — SIMVASTATIN 10 MG PO TABS: ORAL_TABLET | ORAL | 1 refills | Status: DC

## 2017-08-03 NOTE — Telephone Encounter (Signed)
Error

## 2017-08-03 NOTE — Telephone Encounter (Signed)
Orders

## 2017-08-28 ENCOUNTER — Other Ambulatory Visit: Payer: Self-pay | Admitting: Internal Medicine

## 2017-09-09 ENCOUNTER — Other Ambulatory Visit: Payer: Self-pay | Admitting: *Deleted

## 2017-09-09 MED ORDER — PANTOPRAZOLE SODIUM 40 MG PO TBEC: 40.0000 mg | DELAYED_RELEASE_TABLET | Freq: Every day | ORAL | 1 refills | Status: DC

## 2017-09-23 ENCOUNTER — Encounter: Payer: Self-pay | Admitting: Internal Medicine

## 2017-09-23 MED ORDER — SILDENAFIL CITRATE 20 MG PO TABS: 20.0000 mg | ORAL_TABLET | Freq: Three times a day (TID) | ORAL | 0 refills | Status: DC

## 2017-09-26 ENCOUNTER — Telehealth: Payer: Self-pay | Admitting: Internal Medicine

## 2017-09-26 NOTE — Telephone Encounter (Signed)
Copied from [place] [id number]. Topic: Quick Communication - See Telephone Encounter >> Sep 26, 2017  4:20 PM Patrice Paradise wrote: Patient needs pre-authorization faxed to his insurance co @ [phone] to have his Rx refilled for Sildenafil (Revatio) 20 mg tablet.  CRM for notification. See Telephone encounter for: 09/26/17.

## 2017-09-26 NOTE — Telephone Encounter (Signed)
See message needing pre authorization faxed

## 2017-09-27 ENCOUNTER — Telehealth: Payer: Self-pay

## 2017-09-27 NOTE — Telephone Encounter (Signed)
Please advise 

## 2017-09-27 NOTE — Telephone Encounter (Signed)
LMTCB. Need to find out pt's current insurance information so we can do the prior authorization.

## 2017-09-27 NOTE — Telephone Encounter (Signed)
PA has been submitted on covermymeds.  

## 2017-09-27 NOTE — Telephone Encounter (Signed)
Copied from [place] #[id number]. Topic: Quick Communication - Office Called Patient >> Sep 27, 2017 10:09 AM Hewitt Shorts wrote: Reason for CRM: pt returned call  to Janett Billow that his insurance is [employer] and id # is [id number]

## 2017-09-27 NOTE — Telephone Encounter (Signed)
Error

## 2017-10-04 ENCOUNTER — Encounter: Payer: Self-pay | Admitting: Internal Medicine

## 2017-10-04 NOTE — Telephone Encounter (Signed)
PA has been denied.  

## 2017-10-14 ENCOUNTER — Encounter: Payer: Self-pay | Admitting: Internal Medicine

## 2017-10-19 ENCOUNTER — Other Ambulatory Visit: Payer: Self-pay | Admitting: Internal Medicine

## 2017-10-21 ENCOUNTER — Encounter: Payer: Self-pay | Admitting: Internal Medicine

## 2017-10-21 ENCOUNTER — Ambulatory Visit (INDEPENDENT_AMBULATORY_CARE_PROVIDER_SITE_OTHER): Payer: 59 | Admitting: Internal Medicine

## 2017-10-21 VITALS — BP 130/66 | HR 89 | Temp 98.4°F | Resp 15 | Ht 66.5 in | Wt 230.6 lb

## 2017-10-21 DIAGNOSIS — R7303 Prediabetes: Secondary | ICD-10-CM

## 2017-10-21 DIAGNOSIS — J069 Acute upper respiratory infection, unspecified: Secondary | ICD-10-CM | POA: Diagnosis not present

## 2017-10-21 DIAGNOSIS — Z8249 Family history of ischemic heart disease and other diseases of the circulatory system: Secondary | ICD-10-CM | POA: Diagnosis not present

## 2017-10-21 DIAGNOSIS — E7800 Pure hypercholesterolemia, unspecified: Secondary | ICD-10-CM

## 2017-10-21 DIAGNOSIS — Z1211 Encounter for screening for malignant neoplasm of colon: Secondary | ICD-10-CM

## 2017-10-21 DIAGNOSIS — K760 Fatty (change of) liver, not elsewhere classified: Secondary | ICD-10-CM

## 2017-10-21 DIAGNOSIS — I10 Essential (primary) hypertension: Secondary | ICD-10-CM | POA: Diagnosis not present

## 2017-10-21 DIAGNOSIS — Z0000 Encounter for general adult medical examination without abnormal findings: Secondary | ICD-10-CM | POA: Diagnosis not present

## 2017-10-21 DIAGNOSIS — Z125 Encounter for screening for malignant neoplasm of prostate: Secondary | ICD-10-CM | POA: Diagnosis not present

## 2017-10-21 DIAGNOSIS — B9789 Other viral agents as the cause of diseases classified elsewhere: Secondary | ICD-10-CM | POA: Diagnosis not present

## 2017-10-21 MED ORDER — LEVOFLOXACIN 500 MG PO TABS: 500.0000 mg | ORAL_TABLET | Freq: Every day | ORAL | 0 refills | Status: DC

## 2017-10-21 MED ORDER — GUAIFENESIN-CODEINE 100-10 MG/5ML PO SYRP: 5.0000 mL | ORAL_SOLUTION | Freq: Three times a day (TID) | ORAL | 0 refills | Status: DC | PRN

## 2017-10-21 MED ORDER — PREDNISONE 10 MG PO TABS: ORAL_TABLET | ORAL | 0 refills | Status: DC

## 2017-10-21 MED ORDER — TRAZODONE HCL 50 MG PO TABS: 25.0000 mg | ORAL_TABLET | Freq: Every evening | ORAL | 1 refills | Status: DC | PRN

## 2017-10-21 NOTE — Patient Instructions (Addendum)
You have a viral URI  Start  The prednisone  tomorrow,  Cough syrup tonight  Continue tylenol sinus and flush sinuses with Netti Pot or [employer] sinus rinse   Start levaquin and probiotic in 2 days if no better or sooner if you develop facial pain    For your insomnia:   "Headspace"   App for I phone   Trial of trazodone to help you sleep.  Take 1/2 tablet  1 hour before bedtime ok to increase gradually if needed to 100 mg .     Turn lap tops and all blue lit devices off 2 hours before bedtime (or change to amber )

## 2017-10-21 NOTE — Progress Notes (Signed)
Patient ID: COLEN ELTZROTH, male    DOB: 31-Mar-1966  Age: 51 y.o. MRN: [id number]  The patient is here for annual preventive  examination and management of other chronic and acute problems.   Colonoscopy needed Ultrasound  Of liver needed FH of aortic aneurysm discussed: Aortic ultrasound needed  (father had aortic aneurysm x 2,  PGM  Had cerebral aneurysm)  The risk factors are reflected in the social history.  The roster of all physicians providing medical care to patient - is listed in the Snapshot section of the chart.  Activities of daily living:  The patient is 100% independent in all ADLs: dressing, toileting, feeding as well as independent mobility  Home safety : The patient has smoke detectors in the home. They wear seatbelts.  There are no firearms at home. There is no violence in the home.   There is no risks for hepatitis, STDs or HIV. There is no   history of blood transfusion. They have no travel history to infectious disease endemic areas of the world.  The patient has seen their dentist in the last six month. They have seen their eye doctor in the last year.   They do not  have excessive sun exposure. Discussed the need for sun protection: hats, long sleeves and use of sunscreen if there is significant sun exposure.   Diet: the importance of a healthy diet is discussed. They do have a healthy diet.  The benefits of regular aerobic exercise were discussed. He does not exercise regularly   Depression screen: there are no signs or vegative symptoms of depression- irritability, change in appetite, anhedonia, sadness/tearfullness.  The following portions of the patient's history were reviewed and updated as appropriate: allergies, current medications, past family history, past medical history,  past surgical history, past social history  and problem list.  Visual acuity was not assessed per patient preference since she has regular follow up with her ophthalmologist. Hearing  and body mass index were assessed and reviewed.   During the course of the visit the patient was educated and counseled about appropriate screening and preventive services including : fall prevention , diabetes screening, nutrition counseling, colorectal cancer screening, and recommended immunizations.    CC: The primary encounter diagnosis was Essential hypertension. Diagnoses of Pure hypercholesterolemia, Prediabetes, Prostate cancer screening, Family history of aortic aneurysm, Fatty liver, Screening for colon cancer, Viral URI with cough, Hepatic steatosis, and Encounter for preventive health examination were also pertinent to this visit.   Cough , congestion and sinus drainage with pIN  For the pstt 3 days.    Started in right frontal sinus  Now maxillary ,  Throat is raw ,  Chest hurts when he coughs ,  Taking tylenol sinus at night,  Not sleeping well  Chronically,  Still coughing  intermittently at night   Chronic insomnia  Early morning waking .  Wife notices light snoring when really tired.   History Noble has a past medical history of Complication of anesthesia, GERD (gastroesophageal reflux disease), Hyperlipidemia, and Hypertension.   He has a past surgical history that includes Tonsillectomy and adenoidectomy; Hernia repair; Cholesteatoma excision (Left, 2000); and Knee arthroscopy with medial menisectomy (Left, 07/01/2016).   His family history includes AAA (abdominal aortic aneurysm) in his father; Aortic aneurysm (age of onset: 34) in his father; Diabetes in his mother; Heart disease (age of onset: 68) in his father; Hypertension in his mother; Uterine cancer in his mother.He reports that  has never smoked. he  has never used smokeless tobacco. He reports that he drinks alcohol. He reports that he does not use drugs.  Outpatient Medications Prior to Visit  Medication Sig Dispense Refill  . aspirin 81 MG tablet Take 81 mg by mouth at bedtime.     . enalapril-hydrochlorothiazide  (VASERETIC) 10-25 MG tablet TAKE 1 TABLET BY MOUTH DAILY. 90 tablet 3  . fenofibrate 160 MG tablet TAKE 1 TABLET BY MOUTH DAILY-NEED TO SCHEDULE OFFICE VISIT WITH PCP FOR FURTHER REFILLS 90 tablet 1  . montelukast (SINGULAIR) 10 MG tablet TAKE 1 TABLET BY MOUTH EVERYDAY AT BEDTIME 90 tablet 0  . pantoprazole (PROTONIX) 40 MG tablet Take 1 tablet (40 mg total) by mouth daily. 90 tablet 1  . sildenafil (REVATIO) 20 MG tablet TAKE 1 TABLET (20 MG TOTAL) 3 (THREE) TIMES DAILY BY MOUTH. 10 tablet 0  . simvastatin (ZOCOR) 20 MG tablet TAKE 1 TABLET BY MOUTH AT BEDTIME 90 tablet 1  . Vitamin D, Ergocalciferol, (DRISDOL) 50000 units CAPS capsule Take 1 capsule (50,000 Units total) by mouth every 7 (seven) days. 12 capsule 3  . ciprofloxacin (CIPRO) 500 MG tablet Take 1 tablet (500 mg total) by mouth 2 (two) times daily. (Patient not taking: Reported on 10/21/2017) 20 tablet 0  . ondansetron (ZOFRAN-ODT) 8 MG disintegrating tablet Take 1 tablet (8 mg total) by mouth every 8 (eight) hours as needed for nausea or vomiting. (Patient not taking: Reported on 10/21/2017) 10 tablet 0  . simvastatin (ZOCOR) 10 MG tablet TAKE 2 TABLETS (=20 MG) BY MOUTH AT BEDTIME (Patient not taking: Reported on 10/21/2017) 180 tablet 1   No facility-administered medications prior to visit.     Review of Systems   Patient denies headache, fevers, malaise, unintentional weight loss, skin rash, eye pain, sinus congestion and sinus pain, sore throat, dysphagia,  hemoptysis , cough, dyspnea, wheezing, chest pain, palpitations, orthopnea, edema, abdominal pain, nausea, melena, diarrhea, constipation, flank pain, dysuria, hematuria, urinary  Frequency, nocturia, numbness, tingling, seizures,  Focal weakness, Loss of consciousness,  Tremor, insomnia, depression, anxiety, and suicidal ideation.      Objective:  BP 130/66 (BP Location: Left Arm, Patient Position: Sitting, Cuff Size: Normal)   Pulse 89   Temp 98.4 F (36.9 C) (Oral)    Resp 15   Ht 5' 6.5" (1.689 m)   Wt 230 lb 9.6 oz (104.6 kg)   SpO2 97%   BMI 36.66 kg/m   Physical Exam   General appearance: alert, cooperative and appears stated age Ears: normal TM's and external ear canals both ears Throat: lips, mucosa, and tongue normal; teeth and gums normal Neck: no adenopathy, no carotid bruit, supple, symmetrical, trachea midline and thyroid not enlarged, symmetric, no tenderness/mass/nodules Back: symmetric, no curvature. ROM normal. No CVA tenderness. Lungs: clear to auscultation bilaterally Heart: regular rate and rhythm, S1, S2 normal, no murmur, click, rub or gallop Abdomen: soft, non-tender; bowel sounds normal; no masses,  no organomegaly Pulses: 2+ and symmetric Skin: Skin color, texture, turgor normal. No rashes or lesions Lymph nodes: Cervical, supraclavicular, and axillary nodes normal.    Assessment & Plan:   Problem List Items Addressed This Visit    Encounter for preventive health examination    Annual comprehensive preventive exam was done as well as an evaluation and management of chronic conditions .  During the course of the visit the patient was educated and counseled about appropriate screening and preventive services including :  diabetes screening, lipid analysis with projected  10 year  risk for CAD , nutrition counseling, breast, cervical and colorectal cancer screening, and recommended immunizations.  Printed recommendations for health maintenance screenings was given  Lab Results  Component Value Date   PSA 0.4 10/21/2017         Essential hypertension - Primary    Well controlled on current regimen. Renal function stable, no changes today.  Lab Results  Component Value Date   CREATININE 1.23 10/21/2017   Lab Results  Component Value Date   NA 140 10/21/2017   K 4.2 10/21/2017   CL 104 10/21/2017   CO2 30 10/21/2017         Relevant Orders   Microalbumin / creatinine urine ratio (Completed)   Hepatic  steatosis    Hepatic enzymes are nearly normalized and he has no signs of cirrhosis or synthetic dysfunction .  continue low glycemic index diet, renew efforts to get  BMI < 30 ,  and management of IPG and  Hyperlipidemia. [place] ordered   Lab Results  Component Value Date   ALT 35 10/21/2017   AST 23 10/21/2017   ALKPHOS 51 01/13/2017   BILITOT 0.3 10/21/2017           Hyperlipidemia    Managed with simvastatin  And fenofibrate.  LDL and triglycerides are at goal on current medications. Low HDL discussed, He has no side effects and liver enzymes are normal. No changes today    Lab Results  Component Value Date   CHOL 107 10/21/2017   HDL 24 (L) 10/21/2017   LDLCALC 54 01/13/2017   TRIG 139 10/21/2017   CHOLHDL 4.5 10/21/2017    Lab Results  Component Value Date   ALT 35 10/21/2017   AST 23 10/21/2017   ALKPHOS 51 01/13/2017   BILITOT 0.3 10/21/2017              Relevant Orders   Lipid panel (Completed)   Prediabetes    I have addressed  BMI and recommended wt loss of 10% of body weight over the next 6 months using a low fat, low starch, high protein  fruit/vegetable based Mediterranean diet and 30 minutes of aerobic exercise a minimum of 5 days per week.         Relevant Orders   Hemoglobin A1c (Completed)   Comprehensive metabolic panel (Completed)   Viral URI with cough    Her symptoms do not suggest that she has a bacterial infection and therefore does not need to take an antibiotic unless symptoms worsen  Supportive care given.        Other Visit Diagnoses    Prostate cancer screening       Relevant Orders   PSA (Completed)   Family history of aortic aneurysm       Relevant Orders   US Aorta   Fatty liver       Relevant Orders   US LIVER DOPPLER   Screening for colon cancer       Relevant Orders   Ambulatory referral to Gastroenterology      I have discontinued Justin Mend. Mckeen's ondansetron and ciprofloxacin. I am also having him start on  predniSONE, guaiFENesin-codeine, levofloxacin, and traZODone. Additionally, I am having him maintain his aspirin, simvastatin, Vitamin D (Ergocalciferol), enalapril-hydrochlorothiazide, fenofibrate, montelukast, pantoprazole, and sildenafil.  Meds ordered this encounter  Medications  . predniSONE (DELTASONE) 10 MG tablet    Sig: 6 tablets on Day 1 , then reduce by 1 tablet daily until gone    Dispense:  21 tablet    Refill:  0  . guaiFENesin-codeine (CHERATUSSIN AC) 100-10 MG/5ML syrup    Sig: Take 5 mLs by mouth 3 (three) times daily as needed for cough.    Dispense:  120 mL    Refill:  0  . levofloxacin (LEVAQUIN) 500 MG tablet    Sig: Take 1 tablet (500 mg total) by mouth daily.    Dispense:  7 tablet    Refill:  0  . traZODone (DESYREL) 50 MG tablet    Sig: Take 0.5-1 tablets (25-50 mg total) by mouth at bedtime as needed for sleep.    Dispense:  90 tablet    Refill:  1    Medications Discontinued During This Encounter  Medication Reason  . ciprofloxacin (CIPRO) 500 MG tablet Patient has not taken in last 30 days  . ondansetron (ZOFRAN-ODT) 8 MG disintegrating tablet Patient has not taken in last 30 days  . simvastatin (ZOCOR) 10 MG tablet Patient has not taken in last 30 days    Follow-up: No Follow-up on file.   Crecencio Mc, MD

## 2017-10-22 LAB — COMPREHENSIVE METABOLIC PANEL
AG RATIO: 1.6 (calc) (ref 1.0–2.5)
ALT: 35 U/L (ref 9–46)
AST: 23 U/L (ref 10–35)
Albumin: 4.1 g/dL (ref 3.6–5.1)
Alkaline phosphatase (APISO): 46 U/L (ref 40–115)
BUN: 16 mg/dL (ref 7–25)
CO2: 30 mmol/L (ref 20–32)
CREATININE: 1.23 mg/dL (ref 0.70–1.33)
Calcium: 9.3 mg/dL (ref 8.6–10.3)
Chloride: 104 mmol/L (ref 98–110)
GLUCOSE: 145 mg/dL — AB (ref 65–99)
Globulin: 2.5 g/dL (calc) (ref 1.9–3.7)
Potassium: 4.2 mmol/L (ref 3.5–5.3)
SODIUM: 140 mmol/L (ref 135–146)
TOTAL PROTEIN: 6.6 g/dL (ref 6.1–8.1)
Total Bilirubin: 0.3 mg/dL (ref 0.2–1.2)

## 2017-10-22 LAB — LIPID PANEL
Cholesterol: 107 mg/dL (ref <200)
HDL: 24 mg/dL — ABNORMAL LOW (ref >40)
LDL CHOLESTEROL (CALC): 61 mg/dL
NON-HDL CHOLESTEROL (CALC): 83 mg/dL (ref <130)
Total CHOL/HDL Ratio: 4.5 (calc) (ref <5.0)
Triglycerides: 139 mg/dL (ref <150)

## 2017-10-22 LAB — PSA: PSA: 0.4 ng/mL (ref <=4.0)

## 2017-10-22 LAB — MICROALBUMIN / CREATININE URINE RATIO
Creatinine, Urine: 108 mg/dL (ref 20–320)
MICROALB UR: 0.3 mg/dL
MICROALB/CREAT RATIO: 3 ug/mg{creat} (ref <30)

## 2017-10-22 LAB — HEMOGLOBIN A1C
EAG (MMOL/L): 7.1 (calc)
Hgb A1c MFr Bld: 6.1 % of total Hgb — ABNORMAL HIGH (ref <5.7)
MEAN PLASMA GLUCOSE: 128 (calc)

## 2017-10-23 ENCOUNTER — Other Ambulatory Visit: Payer: Self-pay | Admitting: Internal Medicine

## 2017-10-23 ENCOUNTER — Encounter: Payer: Self-pay | Admitting: Internal Medicine

## 2017-10-23 DIAGNOSIS — B9789 Other viral agents as the cause of diseases classified elsewhere: Secondary | ICD-10-CM

## 2017-10-23 DIAGNOSIS — J069 Acute upper respiratory infection, unspecified: Secondary | ICD-10-CM | POA: Insufficient documentation

## 2017-10-23 DIAGNOSIS — Z0000 Encounter for general adult medical examination without abnormal findings: Secondary | ICD-10-CM | POA: Insufficient documentation

## 2017-10-23 NOTE — Assessment & Plan Note (Signed)
Hepatic enzymes are nearly normalized and he has no signs of cirrhosis or synthetic dysfunction .  continue low glycemic index diet, renew efforts to get  BMI < 30 ,  and management of IPG and  Hyperlipidemia. [place] ordered   Lab Results  Component Value Date   ALT 35 10/21/2017   AST 23 10/21/2017   ALKPHOS 51 01/13/2017   BILITOT 0.3 10/21/2017

## 2017-10-23 NOTE — Assessment & Plan Note (Signed)
Annual comprehensive preventive exam was done as well as an evaluation and management of chronic conditions .  During the course of the visit the patient was educated and counseled about appropriate screening and preventive services including :  diabetes screening, lipid analysis with projected  10 year  risk for CAD , nutrition counseling, breast, cervical and colorectal cancer screening, and recommended immunizations.  Printed recommendations for health maintenance screenings was given  Lab Results  Component Value Date   PSA 0.4 10/21/2017

## 2017-10-23 NOTE — Assessment & Plan Note (Signed)
Well controlled on current regimen. Renal function stable, no changes today.  Lab Results  Component Value Date   CREATININE 1.23 10/21/2017   Lab Results  Component Value Date   NA 140 10/21/2017   K 4.2 10/21/2017   CL 104 10/21/2017   CO2 30 10/21/2017

## 2017-10-23 NOTE — Assessment & Plan Note (Signed)
Managed with simvastatin  And fenofibrate.  LDL and triglycerides are at goal on current medications. Low HDL discussed, He has no side effects and liver enzymes are normal. No changes today    Lab Results  Component Value Date   CHOL 107 10/21/2017   HDL 24 (L) 10/21/2017   LDLCALC 54 01/13/2017   TRIG 139 10/21/2017   CHOLHDL 4.5 10/21/2017    Lab Results  Component Value Date   ALT 35 10/21/2017   AST 23 10/21/2017   ALKPHOS 51 01/13/2017   BILITOT 0.3 10/21/2017

## 2017-10-23 NOTE — Assessment & Plan Note (Signed)
I have addressed  BMI and recommended wt loss of 10% of body weight over the next 6 months using a low fat, low starch, high protein  fruit/vegetable based Mediterranean diet and 30 minutes of aerobic exercise a minimum of 5 days per week.   

## 2017-10-23 NOTE — Assessment & Plan Note (Signed)
Her symptoms do not suggest that she has a bacterial infection and therefore does not need to take an antibiotic unless symptoms worsen  Supportive care given.

## 2017-11-03 ENCOUNTER — Other Ambulatory Visit: Payer: Self-pay

## 2017-11-03 ENCOUNTER — Telehealth: Payer: Self-pay | Admitting: Gastroenterology

## 2017-11-03 DIAGNOSIS — Z1211 Encounter for screening for malignant neoplasm of colon: Secondary | ICD-10-CM

## 2017-11-03 NOTE — Telephone Encounter (Signed)
Gastroenterology Pre-Procedure Review  Request Date: 11/17/17 Requesting Physician: Dr. Marius Ditch  PATIENT REVIEW QUESTIONS: The patient responded to the following health history questions as indicated:    1. Are you having any GI issues? no 2. Do you have a personal history of Polyps? no 3. Do you have a family history of Colon Cancer or Polyps? no 4. Diabetes Mellitus? no 5. Joint replacements in the past 12 months?yes (Knee surgery in August 2018) 6. Major health problems in the past 3 months?no 7. Any artificial heart valves, MVP, or defibrillator?no    MEDICATIONS & ALLERGIES:    Patient reports the following regarding taking any anticoagulation/antiplatelet therapy:   Plavix, Coumadin, Eliquis, Xarelto, Lovenox, Pradaxa, Brilinta, or Effient? no Aspirin? yes (81 mg)  Patient confirms/reports the following medications:  Current Outpatient Medications  Medication Sig Dispense Refill  . aspirin 81 MG tablet Take 81 mg by mouth at bedtime.     . enalapril-hydrochlorothiazide (VASERETIC) 10-25 MG tablet TAKE 1 TABLET BY MOUTH DAILY. 90 tablet 3  . fenofibrate 160 MG tablet TAKE 1 TABLET BY MOUTH DAILY-NEED TO SCHEDULE OFFICE VISIT WITH PCP FOR FURTHER REFILLS 90 tablet 1  . guaiFENesin-codeine (CHERATUSSIN AC) 100-10 MG/5ML syrup Take 5 mLs by mouth 3 (three) times daily as needed for cough. 120 mL 0  . levofloxacin (LEVAQUIN) 500 MG tablet Take 1 tablet (500 mg total) by mouth daily. 7 tablet 0  . montelukast (SINGULAIR) 10 MG tablet TAKE 1 TABLET BY MOUTH EVERYDAY AT BEDTIME 90 tablet 0  . pantoprazole (PROTONIX) 40 MG tablet Take 1 tablet (40 mg total) by mouth daily. 90 tablet 1  . predniSONE (DELTASONE) 10 MG tablet 6 tablets on Day 1 , then reduce by 1 tablet daily until gone 21 tablet 0  . sildenafil (REVATIO) 20 MG tablet TAKE 1 TABLET (20 MG TOTAL) 3 (THREE) TIMES DAILY BY MOUTH. 10 tablet 0  . simvastatin (ZOCOR) 20 MG tablet TAKE 1 TABLET BY MOUTH AT BEDTIME 90 tablet 1  .  traZODone (DESYREL) 50 MG tablet Take 0.5-1 tablets (25-50 mg total) by mouth at bedtime as needed for sleep. 90 tablet 1  . Vitamin D, Ergocalciferol, (DRISDOL) 50000 units CAPS capsule Take 1 capsule (50,000 Units total) by mouth every 7 (seven) days. 12 capsule 3   No current facility-administered medications for this visit.     Patient confirms/reports the following allergies:  Allergies  Allergen Reactions  . Scopolamine Other (See Comments)    vision  . Shellfish Allergy Other (See Comments)    throat starts to become scratchy, denies difficulty breathing or swelling of mouth, lips or throat. Denies problems with betadine topically.  . Niacin And Related Rash    Pens and needle sensation    No orders of the defined types were placed in this encounter.   AUTHORIZATION INFORMATION Primary Insurance: 1D#: Group #:  Secondary Insurance: 1D#: Group #:  SCHEDULE INFORMATION: Date: 11/17/17 Time: Location:[facility]

## 2017-11-03 NOTE — Telephone Encounter (Signed)
Patient returned a call to schedule colonoscopy

## 2017-11-09 ENCOUNTER — Ambulatory Visit
Admission: RE | Admit: 2017-11-09 | Discharge: 2017-11-09 | Disposition: A | Discharge status: Home / Self Care | Payer: 59 | Source: Ambulatory Visit | Attending: Internal Medicine | Admitting: Internal Medicine

## 2017-11-09 ENCOUNTER — Other Ambulatory Visit: Payer: Self-pay | Admitting: Internal Medicine

## 2017-11-09 DIAGNOSIS — K760 Fatty (change of) liver, not elsewhere classified: Secondary | ICD-10-CM | POA: Diagnosis present

## 2017-11-09 DIAGNOSIS — I77811 Abdominal aortic ectasia: Secondary | ICD-10-CM | POA: Diagnosis not present

## 2017-11-09 DIAGNOSIS — Z8249 Family history of ischemic heart disease and other diseases of the circulatory system: Secondary | ICD-10-CM

## 2017-11-13 ENCOUNTER — Encounter: Payer: Self-pay | Admitting: Internal Medicine

## 2017-11-16 ENCOUNTER — Other Ambulatory Visit: Payer: Self-pay | Admitting: Internal Medicine

## 2017-11-17 ENCOUNTER — Other Ambulatory Visit: Payer: Self-pay | Admitting: Internal Medicine

## 2017-11-17 ENCOUNTER — Encounter
Admission: RE | Disposition: A | Discharge status: Home / Self Care | Payer: Self-pay | Source: Ambulatory Visit | Attending: Gastroenterology

## 2017-11-17 ENCOUNTER — Ambulatory Visit: Payer: 59 | Admitting: Anesthesiology

## 2017-11-17 ENCOUNTER — Ambulatory Visit
Admission: RE | Admit: 2017-11-17 | Discharge: 2017-11-17 | Disposition: A | Discharge status: Home / Self Care | Payer: 59 | Source: Ambulatory Visit | Attending: Gastroenterology | Admitting: Gastroenterology

## 2017-11-17 DIAGNOSIS — Z7982 Long term (current) use of aspirin: Secondary | ICD-10-CM | POA: Diagnosis not present

## 2017-11-17 DIAGNOSIS — I739 Peripheral vascular disease, unspecified: Secondary | ICD-10-CM | POA: Diagnosis not present

## 2017-11-17 DIAGNOSIS — Z1211 Encounter for screening for malignant neoplasm of colon: Secondary | ICD-10-CM | POA: Diagnosis not present

## 2017-11-17 DIAGNOSIS — Z79899 Other long term (current) drug therapy: Secondary | ICD-10-CM | POA: Insufficient documentation

## 2017-11-17 DIAGNOSIS — I10 Essential (primary) hypertension: Secondary | ICD-10-CM | POA: Diagnosis not present

## 2017-11-17 DIAGNOSIS — Z91013 Allergy to seafood: Secondary | ICD-10-CM | POA: Diagnosis not present

## 2017-11-17 DIAGNOSIS — E785 Hyperlipidemia, unspecified: Secondary | ICD-10-CM | POA: Insufficient documentation

## 2017-11-17 DIAGNOSIS — K219 Gastro-esophageal reflux disease without esophagitis: Secondary | ICD-10-CM | POA: Diagnosis not present

## 2017-11-17 DIAGNOSIS — Z888 Allergy status to other drugs, medicaments and biological substances status: Secondary | ICD-10-CM | POA: Diagnosis not present

## 2017-11-17 HISTORY — PX: COLONOSCOPY WITH PROPOFOL: SHX5780

## 2017-11-17 SURGERY — COLONOSCOPY WITH PROPOFOL
Anesthesia: General

## 2017-11-17 MED ORDER — PROPOFOL 500 MG/50ML IV EMUL
INTRAVENOUS | Status: AC
  Filled 2017-11-17: qty 50

## 2017-11-17 MED ORDER — PROPOFOL 10 MG/ML IV BOLUS
INTRAVENOUS | Status: DC | PRN
  Administered 2017-11-17: 100 mg via INTRAVENOUS

## 2017-11-17 MED ORDER — FENTANYL CITRATE (PF) 100 MCG/2ML IJ SOLN
INTRAMUSCULAR | Status: DC | PRN
  Administered 2017-11-17 (×2): 50 ug via INTRAVENOUS

## 2017-11-17 MED ORDER — FENTANYL CITRATE (PF) 100 MCG/2ML IJ SOLN
INTRAMUSCULAR | Status: AC
  Filled 2017-11-17: qty 2

## 2017-11-17 MED ORDER — SODIUM CHLORIDE 0.9 % IV SOLN
INTRAVENOUS | Status: DC
  Administered 2017-11-17: 1000 mL via INTRAVENOUS

## 2017-11-17 MED ORDER — PROPOFOL 500 MG/50ML IV EMUL
INTRAVENOUS | Status: DC | PRN
  Administered 2017-11-17: 160 ug/kg/min via INTRAVENOUS

## 2017-11-17 MED ORDER — LIDOCAINE 2% (20 MG/ML) 5 ML SYRINGE
INTRAMUSCULAR | Status: DC | PRN
  Administered 2017-11-17: 40 mg via INTRAVENOUS

## 2017-11-17 NOTE — H&P (Signed)
[name], MD [address]  [place]  [place], [place] [address]  Main: [phone]  Fax: [phone] Pager: [phone]  Primary Care Physician:  Crecencio Mc, MD Primary Gastroenterologist:  Dr. [name]  Pre-Procedure History & Physical: HPI:  Christopher Vazquez is a 52 y.o. male is here for an colonoscopy.   Past Medical History:  Diagnosis Date  . Complication of anesthesia    with tonsillectomy sever swelling of throat, possible laryngiospasm.  Marland Kitchen GERD (gastroesophageal reflux disease)   . Hyperlipidemia   . Hypertension     Past Surgical History:  Procedure Laterality Date  . CHOLESTEATOMA EXCISION Left 2000   left ear  . HERNIA REPAIR    . KNEE ARTHROSCOPY WITH MEDIAL MENISECTOMY Left 07/01/2016   Procedure: left knee arthroscopy with partial medial menisectomy;  Surgeon: Hessie Knows, MD;  Location: [facility] ORS;  Service: Orthopedics;  Laterality: Left;  . TONSILLECTOMY AND ADENOIDECTOMY      Prior to Admission medications   Medication Sig Start Date End Date Taking? Authorizing Provider  aspirin 81 MG tablet Take 81 mg by mouth at bedtime.    Yes [provider]  enalapril-hydrochlorothiazide (VASERETIC) 10-25 MG tablet TAKE 1 TABLET BY MOUTH DAILY. 03/01/17  Yes Crecencio Mc, MD  fenofibrate 160 MG tablet TAKE 1 TABLET BY MOUTH DAILY-NEED TO SCHEDULE OFFICE VISIT WITH PCP FOR FURTHER REFILLS 06/23/17  Yes Crecencio Mc, MD  guaiFENesin-codeine (CHERATUSSIN AC) 100-10 MG/5ML syrup Take 5 mLs by mouth 3 (three) times daily as needed for cough. 10/21/17  Yes Crecencio Mc, MD  montelukast (SINGULAIR) 10 MG tablet TAKE 1 TABLET BY MOUTH EVERYDAY AT BEDTIME 11/16/17  Yes Crecencio Mc, MD  pantoprazole (PROTONIX) 40 MG tablet Take 1 tablet (40 mg total) by mouth daily. 09/09/17  Yes Crecencio Mc, MD  sildenafil (REVATIO) 20 MG tablet TAKE 1 TABLET (20 MG TOTAL) 3 (THREE) TIMES DAILY BY MOUTH. 11/16/17  Yes Crecencio Mc, MD  simvastatin  (ZOCOR) 20 MG tablet TAKE 1 TABLET BY MOUTH AT BEDTIME 08/30/16  Yes Crecencio Mc, MD  traZODone (DESYREL) 50 MG tablet Take 0.5-1 tablets (25-50 mg total) by mouth at bedtime as needed for sleep. 10/21/17  Yes Crecencio Mc, MD  Vitamin D, Ergocalciferol, (DRISDOL) 50000 units CAPS capsule Take 1 capsule (50,000 Units total) by mouth every 7 (seven) days. 01/18/17  Yes Crecencio Mc, MD  levofloxacin (LEVAQUIN) 500 MG tablet Take 1 tablet (500 mg total) by mouth daily. Patient not taking: Reported on 11/17/2017 10/21/17   Crecencio Mc, MD  predniSONE (DELTASONE) 10 MG tablet 6 tablets on Day 1 , then reduce by 1 tablet daily until gone Patient not taking: Reported on 11/17/2017 10/21/17   Crecencio Mc, MD    Allergies as of 11/04/2017 - Review Complete 10/21/2017  Allergen Reaction Noted  . Scopolamine Other (See Comments) 05/10/2016  . Shellfish allergy Other (See Comments) 06/21/2016  . Niacin and related Rash 11/19/2015    Family History  Problem Relation Age of Onset  . Diabetes Mother   . Hypertension Mother   . Uterine cancer Mother   . Aortic aneurysm Father 68  . Heart disease Father 78  . AAA (abdominal aortic aneurysm) Father     Social History   Socioeconomic History  . Marital status: Married    Spouse name: Not on file  . Number of children: 3  . Years of education: Not on file  .  Highest education level: Not on file  Social Needs  . Financial resource strain: Not on file  . Food insecurity - worry: Not on file  . Food insecurity - inability: Not on file  . Transportation needs - medical: Not on file  . Transportation needs - non-medical: Not on file  Occupational History  . Occupation: Buyer, retail: [employer]  Tobacco Use  . Smoking status: Never Smoker  . Smokeless tobacco: Never Used  Substance and Sexual Activity  . Alcohol use: Yes    Comment: 1-2 drinks every 3-4 months  . Drug use: No  . Sexual activity: Not on file  Other  Topics Concern  . Not on file  Social History Narrative  . Not on file    Review of Systems: See HPI, otherwise negative ROS  Physical Exam: BP 130/85   Pulse 84   Temp (!) 97.5 F (36.4 C) (Tympanic)   Resp 16   Ht 5\' 7"  (1.702 m)   Wt 220 lb (99.8 kg)   SpO2 99%   BMI 34.46 kg/m  General:   Alert,  pleasant and cooperative in NAD Head:  Normocephalic and atraumatic. Neck:  Supple; no masses or thyromegaly. Lungs:  Clear throughout to auscultation.    Heart:  Regular rate and rhythm. Abdomen:  Soft, nontender and nondistended. Normal bowel sounds, without guarding, and without rebound.   Neurologic:  Alert and  oriented x4;  grossly normal neurologically.  Impression/Plan: Christopher Vazquez is here for an colonoscopy to be performed for colon cancer screening  Risks, benefits, limitations, and alternatives regarding  colonoscopy have been reviewed with the patient.  Questions have been answered.  All parties agreeable.   Sherri Sear, MD  11/17/2017, 9:06 AM

## 2017-11-17 NOTE — Anesthesia Preprocedure Evaluation (Signed)
Anesthesia Evaluation  Patient identified by MRN, date of birth, ID band Patient awake    Reviewed: Allergy & Precautions, H&P , NPO status , Patient's Chart, lab work & pertinent test results, reviewed documented beta blocker date and time   History of Anesthesia Complications (+) history of anesthetic complications  Airway Mallampati: II   Neck ROM: full    Dental  (+) Poor Dentition   Pulmonary neg pulmonary ROS,    Pulmonary exam normal        Cardiovascular Exercise Tolerance: Poor hypertension, On Medications + Peripheral Vascular Disease  negative cardio ROS Normal cardiovascular exam Rhythm:regular Rate:Normal     Neuro/Psych negative neurological ROS  negative psych ROS   GI/Hepatic negative GI ROS, Neg liver ROS, GERD  Medicated,  Endo/Other  negative endocrine ROS  Renal/GU negative Renal ROS  negative genitourinary   Musculoskeletal   Abdominal   Peds  Hematology negative hematology ROS (+)   Anesthesia Other Findings Past Medical History: No date: Complication of anesthesia     Comment:  with tonsillectomy sever swelling of throat, possible               laryngiospasm. No date: GERD (gastroesophageal reflux disease) No date: Hyperlipidemia No date: Hypertension Past Surgical History: 2000: CHOLESTEATOMA EXCISION; Left     Comment:  left ear No date: HERNIA REPAIR 07/01/2016: KNEE ARTHROSCOPY WITH MEDIAL MENISECTOMY; Left     Comment:  Procedure: left knee arthroscopy with partial medial               menisectomy;  Surgeon: Hessie Knows, MD;  Location: [facility]               ORS;  Service: Orthopedics;  Laterality: Left; No date: TONSILLECTOMY AND ADENOIDECTOMY   Reproductive/Obstetrics negative OB ROS                             Anesthesia Physical Anesthesia Plan  ASA: III  Anesthesia Plan: General   Post-op Pain Management:    Induction:   PONV Risk Score  and Plan:   Airway Management Planned:   Additional Equipment:   Intra-op Plan:   Post-operative Plan:   Informed Consent: I have reviewed the patients History and Physical, chart, labs and discussed the procedure including the risks, benefits and alternatives for the proposed anesthesia with the patient or authorized representative who has indicated his/her understanding and acceptance.   Dental Advisory Given  Plan Discussed with: CRNA  Anesthesia Plan Comments:         Anesthesia Quick Evaluation

## 2017-11-17 NOTE — Anesthesia Post-op Follow-up Note (Signed)
Anesthesia QCDR form completed.        

## 2017-11-17 NOTE — Op Note (Signed)
[facility] Patient Name: Christopher Vazquez Procedure Date: 11/17/2017 9:22 AM MRN: [id number] Account #: [id number] Date of Birth: 07-15-1966 Admit Type: Outpatient Age: 52 Room: [facility] ENDO ROOM 4 Gender: Male Note Status: Finalized Procedure:            Colonoscopy Indications:          Screening for colorectal malignant neoplasm, This is                        the patient's first colonoscopy Providers:            Lin Landsman MD, MD Medicines:            Monitored Anesthesia Care Complications:        No immediate complications. Estimated blood loss: None. Procedure:            Pre-Anesthesia Assessment:                       - Prior to the procedure, a History and Physical was                        performed, and patient medications and allergies were                        reviewed. The patient is competent. The risks and                        benefits of the procedure and the sedation options and                        risks were discussed with the patient. All questions                        were answered and informed consent was obtained.                        Patient identification and proposed procedure were                        verified by the physician, the nurse, the                        anesthesiologist, the anesthetist and the technician in                        the pre-procedure area in the procedure room. Mental                        Status Examination: alert and oriented. Airway                        Examination: normal oropharyngeal airway and neck                        mobility. Respiratory Examination: clear to                        auscultation. CV Examination: normal. Prophylactic                        Antibiotics:  The patient does not require prophylactic                        antibiotics. Prior Anticoagulants: The patient has                        taken aspirin, last dose was 2 days prior to procedure.                      ASA Grade Assessment: III - A patient with severe                        systemic disease. After reviewing the risks and                        benefits, the patient was deemed in satisfactory                        condition to undergo the procedure. The anesthesia plan                        was to use monitored anesthesia care (MAC). Immediately                        prior to administration of medications, the patient was                        re-assessed for adequacy to receive sedatives. The                        heart rate, respiratory rate, oxygen saturations, blood                        pressure, adequacy of pulmonary ventilation, and                        response to care were monitored throughout the                        procedure. The physical status of the patient was                        re-assessed after the procedure.                       After obtaining informed consent, the colonoscope was                        passed under direct vision. Throughout the procedure,                        the patient's blood pressure, pulse, and oxygen                        saturations were monitored continuously. The                        Colonoscope was introduced through the anus and                        advanced to  the the terminal ileum. The colonoscopy was                        performed without difficulty. The patient tolerated the                        procedure well. The quality of the bowel preparation                        was evaluated using the BBPS [facility] Bowel Preparation                        Scale) with scores of: Right Colon = 3, Transverse                        Colon = 3 and Left Colon = 3 (entire mucosa seen well                        with no residual staining, small fragments of stool or                        opaque liquid). The total BBPS score equals 9. Findings:      The perianal and digital rectal examinations were normal.  Pertinent       negatives include normal sphincter tone and no palpable rectal lesions.      The terminal ileum appeared normal.      The entire examined colon appeared normal on direct and retroflexion       views. Impression:           - The examined portion of the ileum was normal.                       - The entire examined colon is normal on direct and                        retroflexion views.                       - No specimens collected. Recommendation:       - Discharge patient to home.                       - Resume previous diet.                       - Continue present medications.                       - Repeat colonoscopy in 10 years for surveillance. Procedure Code(s):    --- Professional ---                       B5597, Colorectal cancer screening; colonoscopy on                        individual not meeting criteria for high risk Diagnosis Code(s):    --- Professional ---                       Z12.11, Encounter for screening for malignant neoplasm  of colon CPT copyright 2016 American Medical Association. All rights reserved. The codes documented in this report are preliminary and upon coder review may  be revised to meet current compliance requirements. Dr. Ulyess Mort Lin Landsman MD, MD 11/17/2017 9:51:28 AM This report has been signed electronically. Number of Addenda: 0 Note Initiated On: 11/17/2017 9:22 AM Scope Withdrawal Time: 0 hours 11 minutes 6 seconds  Total Procedure Duration: 0 hours 18 minutes 1 second       [facility]

## 2017-11-17 NOTE — Transfer of Care (Signed)
Immediate Anesthesia Transfer of Care Note  Patient: Christopher Vazquez  Procedure(s) Performed: COLONOSCOPY WITH PROPOFOL (N/A )  Patient Location: PACU and Endoscopy Unit  Anesthesia Type:General  Level of Consciousness: drowsy  Airway & Oxygen Therapy: Patient Spontanous Breathing and Patient connected to nasal cannula oxygen  Post-op Assessment: Report given to RN and Post -op Vital signs reviewed and stable  Post vital signs: Reviewed and stable  Last Vitals:  Vitals:   11/17/17 0852  BP: 130/85  Pulse: 84  Resp: 16  Temp: (!) 36.4 C  SpO2: 99%    Last Pain:  Vitals:   11/17/17 0852  TempSrc: Tympanic         Complications: No apparent anesthesia complications

## 2017-11-18 NOTE — Anesthesia Postprocedure Evaluation (Signed)
Anesthesia Post Note  Patient: Christopher Vazquez  Procedure(s) Performed: COLONOSCOPY WITH PROPOFOL (N/A )  Patient location during evaluation: PACU Anesthesia Type: General Level of consciousness: awake and alert Pain management: pain level controlled Vital Signs Assessment: post-procedure vital signs reviewed and stable Respiratory status: spontaneous breathing, nonlabored ventilation, respiratory function stable and patient connected to nasal cannula oxygen Cardiovascular status: blood pressure returned to baseline and stable Postop Assessment: no apparent nausea or vomiting Anesthetic complications: no     Last Vitals:  Vitals:   11/17/17 1013 11/17/17 1024  BP: 125/77 132/90  Pulse: 83 75  Resp: 16 19  Temp:    SpO2: 98% 97%    Last Pain:  Vitals:   11/17/17 0852  TempSrc: Tympanic                 Molli Barrows

## 2017-11-21 ENCOUNTER — Encounter: Payer: Self-pay | Admitting: Gastroenterology

## 2017-11-22 ENCOUNTER — Telehealth: Payer: Self-pay

## 2017-11-22 NOTE — Telephone Encounter (Signed)
PA for sildenafil has been submitted on covermymeds.

## 2017-12-01 ENCOUNTER — Ambulatory Visit: Payer: 59

## 2017-12-01 ENCOUNTER — Ambulatory Visit (INDEPENDENT_AMBULATORY_CARE_PROVIDER_SITE_OTHER): Payer: 59

## 2017-12-01 DIAGNOSIS — Z23 Encounter for immunization: Secondary | ICD-10-CM | POA: Diagnosis not present

## 2017-12-01 NOTE — Progress Notes (Signed)
Patient is here for Twinrix vaccination. Patient tolerated shot very well, had no questions, comments, or concerns at this time.

## 2017-12-11 NOTE — Progress Notes (Signed)
  I have reviewed the above information and agree with above.   Tanae Petrosky, MD 

## 2017-12-17 ENCOUNTER — Encounter: Payer: Self-pay | Admitting: Internal Medicine

## 2017-12-20 ENCOUNTER — Other Ambulatory Visit: Payer: Self-pay

## 2017-12-20 MED ORDER — FENOFIBRATE 160 MG PO TABS: ORAL_TABLET | ORAL | 1 refills | Status: DC

## 2017-12-21 MED ORDER — TRAZODONE HCL 100 MG PO TABS: 100.0000 mg | ORAL_TABLET | Freq: Every evening | ORAL | 1 refills | Status: DC | PRN

## 2018-01-09 ENCOUNTER — Encounter: Payer: Self-pay | Admitting: Family

## 2018-01-09 ENCOUNTER — Ambulatory Visit (INDEPENDENT_AMBULATORY_CARE_PROVIDER_SITE_OTHER): Payer: 59 | Admitting: Family

## 2018-01-09 VITALS — BP 132/70 | HR 86 | Temp 98.6°F | Ht 67.0 in | Wt 226.0 lb

## 2018-01-09 DIAGNOSIS — R6889 Other general symptoms and signs: Secondary | ICD-10-CM

## 2018-01-09 LAB — POC INFLUENZA A&B (BINAX/QUICKVUE)
INFLUENZA B, POC: NEGATIVE
Influenza A, POC: NEGATIVE

## 2018-01-09 MED ORDER — OSELTAMIVIR PHOSPHATE 75 MG PO CAPS: 75.0000 mg | ORAL_CAPSULE | Freq: Two times a day (BID) | ORAL | 0 refills | Status: DC

## 2018-01-09 MED ORDER — FLUTICASONE PROPIONATE 50 MCG/ACT NA SUSP: 2.0000 | Freq: Every day | NASAL | 1 refills | Status: DC

## 2018-01-09 NOTE — Progress Notes (Signed)
Christopher Vazquez is a 52 y.o. male with the following history as recorded in EpicCare:  Patient Active Problem List   Diagnosis Date Noted  . Special screening for malignant neoplasms, colon   . Viral URI with cough 10/23/2017  . Encounter for preventive health examination 10/23/2017  . Vasomotor flushing 02/10/2016  . Musculoskeletal pain 02/04/2016  . Prediabetes 08/18/2015  . Obesity 02/09/2015  . Vitamin D deficiency 02/19/2014  . Seasonal allergic rhinitis 02/02/2014  . Dysphagia, unspecified(787.20) 03/07/2013  . Hepatic steatosis 03/07/2013  . Polyarthritis 01/07/2012  . Fatigue 11/14/2011  . Hyperlipidemia   . Essential hypertension     Current Outpatient Medications  Medication Sig Dispense Refill  . aspirin 81 MG tablet Take 81 mg by mouth at bedtime.     Marland Kitchen CIALIS 20 MG tablet     . enalapril-hydrochlorothiazide (VASERETIC) 10-25 MG tablet TAKE 1 TABLET BY MOUTH DAILY. 90 tablet 3  . fenofibrate 160 MG tablet TAKE 1 TABLET BY MOUTH DAILY 90 tablet 1  . montelukast (SINGULAIR) 10 MG tablet TAKE 1 TABLET BY MOUTH EVERYDAY AT BEDTIME 90 tablet 0  . pantoprazole (PROTONIX) 40 MG tablet Take 1 tablet (40 mg total) by mouth daily. 90 tablet 1  . simvastatin (ZOCOR) 20 MG tablet TAKE 1 TABLET BY MOUTH AT BEDTIME 90 tablet 1  . traZODone (DESYREL) 100 MG tablet Take 1 tablet (100 mg total) by mouth at bedtime as needed for sleep. 90 tablet 1  . fluticasone (FLONASE) 50 MCG/ACT nasal spray Place 2 sprays into both nostrils daily. 16 g 1  . oseltamivir (TAMIFLU) 75 MG capsule Take 1 capsule (75 mg total) by mouth 2 (two) times daily. 10 capsule 0   No current facility-administered medications for this visit.     Allergies: Scopolamine; Shellfish allergy; and Niacin and related  Past Medical History:  Diagnosis Date  . Complication of anesthesia    with tonsillectomy sever swelling of throat, possible laryngiospasm.  Marland Kitchen GERD (gastroesophageal reflux disease)   . Hyperlipidemia    . Hypertension     Past Surgical History:  Procedure Laterality Date  . CHOLESTEATOMA EXCISION Left 2000   left ear  . COLONOSCOPY WITH PROPOFOL N/A 11/17/2017   Procedure: COLONOSCOPY WITH PROPOFOL;  Surgeon: Lin Landsman, MD;  Location: [facility]COPY;  Service: Gastroenterology;  Laterality: N/A;  . HERNIA REPAIR    . KNEE ARTHROSCOPY WITH MEDIAL MENISECTOMY Left 07/01/2016   Procedure: left knee arthroscopy with partial medial menisectomy;  Surgeon: Hessie Knows, MD;  Location: [facility] ORS;  Service: Orthopedics;  Laterality: Left;  . TONSILLECTOMY AND ADENOIDECTOMY      Family History  Problem Relation Age of Onset  . Diabetes Mother   . Hypertension Mother   . Uterine cancer Mother   . Aortic aneurysm Father 63  . Heart disease Father 55  . AAA (abdominal aortic aneurysm) Father     Social History   Tobacco Use  . Smoking status: Never Smoker  . Smokeless tobacco: Never Used  Substance Use Topics  . Alcohol use: Yes    Comment: 1-2 drinks every 3-4 months    Subjective:  Started with sudden onset of flu-like symptoms on Saturday; + achy, chills; notes that is not prone to run the fever; notes that daughter tested positive for the flu; denies any chest pain or shortness of breath; + scratchy sore throat; using OTC Dayqil Cold and Flu with limited benefit; was told by the nurse at his job that needed to  go home and would need a note before he could return to work.  Objective:  Vitals:   01/09/18 1428  BP: 132/70  Pulse: 86  Temp: 98.6 F (37 C)  TempSrc: Oral  SpO2: 99%  Weight: 226 lb 0.6 oz (102.5 kg)  Height: 5\' 7"  (1.702 m)    General: Well developed, well nourished, in no acute distress  Skin : Warm and dry.  Head: Normocephalic and atraumatic  Eyes: Sclera and conjunctiva clear; pupils round and reactive to light; extraocular movements intact  Ears: External normal; canals clear; tympanic membranes normal  Oropharynx: Pink, supple. No suspicious  lesions  Neck: Supple without thyromegaly, adenopathy  Lungs: Respirations unlabored; clear to auscultation bilaterally without wheeze, rales, rhonchi  CVS exam: normal rate and regular rhythm.  Abdomen: Soft; nontender; nondistended; normoactive bowel sounds; no masses or hepatosplenomegaly  Musculoskeletal: No deformities; no active joint inflammation  Extremities: No edema, cyanosis, clubbing  Vessels: Symmetric bilaterally  Neurologic: Alert and oriented; speech intact; face symmetrical; moves all extremities well; CNII-XII intact without focal deficit  Assessment:  1. Flu-like symptoms     Plan:  Rapid flu is negative; however, will treat based on symptoms and family member's illness; start Tamiflu 75 mg bid x 5 days; Rx for Flonase; increase fluids, rest and follow-up worse, no better.   No Follow-up on file.  Orders Placed This Encounter  Procedures  . POC Influenza A&B (Binax test)    Requested Prescriptions   Signed Prescriptions Disp Refills  . oseltamivir (TAMIFLU) 75 MG capsule 10 capsule 0    Sig: Take 1 capsule (75 mg total) by mouth 2 (two) times daily.  . fluticasone (FLONASE) 50 MCG/ACT nasal spray 16 g 1    Sig: Place 2 sprays into both nostrils daily.

## 2018-01-11 ENCOUNTER — Encounter: Payer: Self-pay | Admitting: Family

## 2018-01-11 ENCOUNTER — Other Ambulatory Visit: Payer: Self-pay | Admitting: Family

## 2018-01-11 MED ORDER — BENZONATATE 100 MG PO CAPS: 100.0000 mg | ORAL_CAPSULE | Freq: Three times a day (TID) | ORAL | 0 refills | Status: DC | PRN

## 2018-01-11 MED ORDER — AZITHROMYCIN 250 MG PO TABS: ORAL_TABLET | ORAL | 0 refills | Status: DC

## 2018-01-11 NOTE — Telephone Encounter (Signed)
Please advise. Thanks.  

## 2018-01-19 ENCOUNTER — Ambulatory Visit: Payer: 59

## 2018-02-14 ENCOUNTER — Other Ambulatory Visit: Payer: Self-pay | Admitting: Internal Medicine

## 2018-02-18 ENCOUNTER — Other Ambulatory Visit: Payer: Self-pay | Admitting: Internal Medicine

## 2018-02-19 ENCOUNTER — Other Ambulatory Visit: Payer: Self-pay | Admitting: Internal Medicine

## 2018-02-21 MED ORDER — FENOFIBRATE 160 MG PO TABS: ORAL_TABLET | ORAL | 1 refills | Status: DC

## 2018-02-21 MED ORDER — SIMVASTATIN 20 MG PO TABS: 20.0000 mg | ORAL_TABLET | Freq: Every day | ORAL | 1 refills | Status: DC

## 2018-03-15 ENCOUNTER — Other Ambulatory Visit: Payer: Self-pay | Admitting: Internal Medicine

## 2018-04-09 ENCOUNTER — Encounter: Payer: Self-pay | Admitting: Internal Medicine

## 2018-04-11 MED ORDER — TADALAFIL 20 MG PO TABS: 20.0000 mg | ORAL_TABLET | ORAL | 11 refills | Status: DC | PRN

## 2018-05-29 ENCOUNTER — Other Ambulatory Visit: Payer: Self-pay | Admitting: Internal Medicine

## 2018-06-12 ENCOUNTER — Encounter: Payer: Self-pay | Admitting: Internal Medicine

## 2018-06-29 NOTE — Telephone Encounter (Signed)
LMTCB. Need to schedule pt a nurse visit for a Hep A/B vaccine. Pt is having to start the series over because it has been too long since his last one.   PEC may speak with pt.

## 2018-07-04 ENCOUNTER — Ambulatory Visit: Payer: 59

## 2018-07-13 ENCOUNTER — Ambulatory Visit: Payer: 59

## 2018-07-19 ENCOUNTER — Ambulatory Visit (INDEPENDENT_AMBULATORY_CARE_PROVIDER_SITE_OTHER): Payer: 59 | Admitting: *Deleted

## 2018-07-19 DIAGNOSIS — Z23 Encounter for immunization: Secondary | ICD-10-CM | POA: Diagnosis not present

## 2018-07-19 DIAGNOSIS — K760 Fatty (change of) liver, not elsewhere classified: Secondary | ICD-10-CM | POA: Diagnosis not present

## 2018-08-05 ENCOUNTER — Other Ambulatory Visit: Payer: Self-pay | Admitting: Internal Medicine

## 2018-08-12 ENCOUNTER — Other Ambulatory Visit: Payer: Self-pay | Admitting: Internal Medicine

## 2018-08-19 ENCOUNTER — Other Ambulatory Visit: Payer: Self-pay | Admitting: Internal Medicine

## 2018-08-21 ENCOUNTER — Other Ambulatory Visit: Payer: Self-pay | Admitting: Internal Medicine

## 2018-08-21 MED ORDER — ONDANSETRON 8 MG PO TBDP: 8.0000 mg | ORAL_TABLET | Freq: Three times a day (TID) | ORAL | 0 refills | Status: DC | PRN

## 2018-08-22 ENCOUNTER — Ambulatory Visit (INDEPENDENT_AMBULATORY_CARE_PROVIDER_SITE_OTHER): Payer: 59

## 2018-08-22 DIAGNOSIS — Z23 Encounter for immunization: Secondary | ICD-10-CM | POA: Diagnosis not present

## 2018-08-22 DIAGNOSIS — K760 Fatty (change of) liver, not elsewhere classified: Secondary | ICD-10-CM | POA: Diagnosis not present

## 2018-08-22 NOTE — Progress Notes (Signed)
Pt here today for nurse visit to receive his second Hep A and Hep B vaccination. Pt tolerated well given in RD.

## 2018-08-23 ENCOUNTER — Ambulatory Visit: Payer: 59

## 2018-09-18 ENCOUNTER — Other Ambulatory Visit: Payer: Self-pay | Admitting: Internal Medicine

## 2018-09-18 ENCOUNTER — Other Ambulatory Visit: Payer: Self-pay

## 2018-09-18 MED ORDER — FLUTICASONE PROPIONATE 50 MCG/ACT NA SUSP: 2.0000 | Freq: Every day | NASAL | 1 refills | Status: DC

## 2018-09-18 NOTE — Telephone Encounter (Signed)
Previously filled by a different provider.

## 2018-10-20 ENCOUNTER — Other Ambulatory Visit: Payer: Self-pay | Admitting: Internal Medicine

## 2018-10-30 ENCOUNTER — Other Ambulatory Visit: Payer: Self-pay | Admitting: Internal Medicine

## 2018-11-15 ENCOUNTER — Other Ambulatory Visit: Payer: Self-pay | Admitting: Internal Medicine

## 2018-12-08 ENCOUNTER — Ambulatory Visit (INDEPENDENT_AMBULATORY_CARE_PROVIDER_SITE_OTHER): Payer: 59

## 2018-12-08 ENCOUNTER — Ambulatory Visit (INDEPENDENT_AMBULATORY_CARE_PROVIDER_SITE_OTHER): Payer: 59 | Admitting: Internal Medicine

## 2018-12-08 ENCOUNTER — Encounter: Payer: Self-pay | Admitting: Internal Medicine

## 2018-12-08 VITALS — BP 116/80 | HR 79 | Temp 98.7°F | Resp 15 | Ht 67.0 in | Wt 227.0 lb

## 2018-12-08 DIAGNOSIS — Z0000 Encounter for general adult medical examination without abnormal findings: Secondary | ICD-10-CM

## 2018-12-08 DIAGNOSIS — E7800 Pure hypercholesterolemia, unspecified: Secondary | ICD-10-CM

## 2018-12-08 DIAGNOSIS — R7303 Prediabetes: Secondary | ICD-10-CM | POA: Diagnosis not present

## 2018-12-08 DIAGNOSIS — Z125 Encounter for screening for malignant neoplasm of prostate: Secondary | ICD-10-CM

## 2018-12-08 DIAGNOSIS — R0789 Other chest pain: Secondary | ICD-10-CM

## 2018-12-08 DIAGNOSIS — R5383 Other fatigue: Secondary | ICD-10-CM | POA: Diagnosis not present

## 2018-12-08 DIAGNOSIS — I10 Essential (primary) hypertension: Secondary | ICD-10-CM

## 2018-12-08 NOTE — Progress Notes (Signed)
Patient ID: Christopher Vazquez, male    DOB: Feb 12, 1966  Age: 53 y.o. MRN: [id number]  The patient is here for annual  Preventive  examination and management of other chronic and acute problems.   The risk factors are reflected in the social history.  The roster of all physicians providing medical care to patient - is listed in the Snapshot section of the chart.  Activities of daily living:  The patient is 100% independent in all ADLs: dressing, toileting, feeding as well as independent mobility  Home safety : The patient has smoke detectors in the home. They wear seatbelts.  There are no firearms at home. There is no violence in the home.   There is no risks for hepatitis, STDs or HIV. There is no   history of blood transfusion. They have no travel history to infectious disease endemic areas of the world.  The patient has seen their dentist in the last six month. They have seen their eye doctor in the last year. They admit to slight hearing difficulty with regard to whispered voices and some television programs.  They have deferred audiologic testing in the last year.  They do not  have excessive sun exposure. Discussed the need for sun protection: hats, long sleeves and use of sunscreen if there is significant sun exposure.   Diet: the importance of a healthy diet is discussed. They do have a healthy diet.  The benefits of regular aerobic exercise were discussed. He is not exercising  Regularly .   Depression screen: there are no signs or vegative symptoms of depression- irritability, change in appetite, anhedonia, sadness/tearfullness.  The following portions of the patient's history were reviewed and updated as appropriate: allergies, current medications, past family history, past medical history,  past surgical history, past social history  and problem list.  Visual acuity was not assessed per patient preference since she has regular follow up with her ophthalmologist. Hearing and body mass  index were assessed and reviewed.   During the course of the visit the patient was educated and counseled about appropriate screening and preventive services including : fall prevention , diabetes screening, nutrition counseling, colorectal cancer screening, and recommended immunizations.    CC: The primary encounter diagnosis was Essential hypertension. Diagnoses of Pure hypercholesterolemia, Fatigue, unspecified type, Prediabetes, Prostate cancer screening, Anterior chest wall pain, and Encounter for preventive health examination were also pertinent to this visit.  History Christopher Vazquez has a past medical history of Complication of anesthesia, GERD (gastroesophageal reflux disease), Hyperlipidemia, and Hypertension.   He has a past surgical history that includes Tonsillectomy and adenoidectomy; Hernia repair; Cholesteatoma excision (Left, 2000); Knee arthroscopy with medial menisectomy (Left, 07/01/2016); and Colonoscopy with propofol (N/A, 11/17/2017).   His family history includes AAA (abdominal aortic aneurysm) in his father; Aortic aneurysm (age of onset: 79) in his father; Diabetes in his mother; Heart disease (age of onset: 52) in his father; Hypertension in his mother; Uterine cancer in his mother.He reports that he has never smoked. He has never used smokeless tobacco. He reports current alcohol use. He reports that he does not use drugs.  Outpatient Medications Prior to Visit  Medication Sig Dispense Refill  . aspirin 81 MG tablet Take 81 mg by mouth at bedtime.     . enalapril-hydrochlorothiazide (VASERETIC) 10-25 MG tablet TAKE 1 TABLET BY MOUTH EVERY DAY 90 tablet 0  . fenofibrate 160 MG tablet TAKE 1 TABLET BY MOUTH DAILY 90 tablet 1  . fluticasone (FLONASE) 50 MCG/ACT nasal  spray SPRAY 2 SPRAYS INTO EACH NOSTRIL EVERY DAY 16 g 1  . montelukast (SINGULAIR) 10 MG tablet TAKE 1 TABLET BY MOUTH EVERYDAY AT BEDTIME 90 tablet 1  . ondansetron (ZOFRAN ODT) 8 MG disintegrating tablet Take 1 tablet  (8 mg total) by mouth every 8 (eight) hours as needed for nausea or vomiting. 20 tablet 0  . pantoprazole (PROTONIX) 40 MG tablet TAKE 1 TABLET BY MOUTH EVERY DAY 90 tablet 1  . simvastatin (ZOCOR) 20 MG tablet TAKE 1 TABLET BY MOUTH EVERYDAY AT BEDTIME 90 tablet 0  . tadalafil (ADCIRCA/CIALIS) 20 MG tablet Take 1 tablet (20 mg total) by mouth every other day as needed for erectile dysfunction. 5 tablet 11  . traZODone (DESYREL) 100 MG tablet TAKE 1 TABLET (100 MG TOTAL) BY MOUTH AT BEDTIME AS NEEDED FOR SLEEP. 90 tablet 1  . azithromycin (ZITHROMAX) 250 MG tablet 2 tabs po qd x 1 day; 1 tablet per day x 4 days; 6 tablet 0  . benzonatate (TESSALON) 100 MG capsule Take 1 capsule (100 mg total) by mouth 3 (three) times daily as needed. 20 capsule 0  . oseltamivir (TAMIFLU) 75 MG capsule Take 1 capsule (75 mg total) by mouth 2 (two) times daily. 10 capsule 0   No facility-administered medications prior to visit.     Review of Systems   Patient denies headache, fevers, malaise, unintentional weight loss, skin rash, eye pain, sinus congestion and sinus pain, sore throat, dysphagia,  hemoptysis , cough, dyspnea, wheezing, chest pain, palpitations, orthopnea, edema, abdominal pain, nausea, melena, diarrhea, constipation, flank pain, dysuria, hematuria, urinary  Frequency, nocturia, numbness, tingling, seizures,  Focal weakness, Loss of consciousness,  Tremor, insomnia, depression, anxiety, and suicidal ideation.      Objective:  BP 116/80 (BP Location: Left Arm, Patient Position: Sitting, Cuff Size: Large)   Pulse 79   Temp 98.7 F (37.1 C) (Oral)   Resp 15   Ht 5' 7" (1.702 m)   Wt 227 lb (103 kg)   SpO2 96%   BMI 35.55 kg/m   Physical Exam   General appearance: alert, cooperative and appears stated age Ears: normal TM's and external ear canals both ears Throat: lips, mucosa, and tongue normal; teeth and gums normal Neck: no adenopathy, no carotid bruit, supple, symmetrical, trachea  midline and thyroid not enlarged, symmetric, no tenderness/mass/nodules Back: symmetric, no curvature. ROM normal. No CVA tenderness. Lungs: clear to auscultation bilaterally Heart: regular rate and rhythm, S1, S2 normal, no murmur, click, rub or gallop Abdomen: soft, non-tender; bowel sounds normal; no masses,  no organomegaly.  Prominent Diastasis rectus  Pulses: 2+ and symmetric Skin: Skin color, texture, turgor normal. No rashes or lesions Lymph nodes: Cervical, supraclavicular, and axillary nodes normal.    Assessment & Plan:   Problem List Items Addressed This Visit    Encounter for preventive health examination    age appropriate education and counseling updated, referrals for preventative services and immunizations addressed, dietary and smoking counseling addressed, most recent labs reviewed.  I have personally reviewed and have noted:  1) the patient's medical and social history 2) The pt's use of alcohol, tobacco, and illicit drugs 3) The patient's current medications and supplements 4) Functional ability including ADL's, fall risk, home safety risk, hearing and visual impairment 5) Diet and physical activities 6) Evidence for depression or mood disorder 7) The patient's height, weight, and BMI have been recorded in the chart  I have made referrals, and provided counseling and education based  on review of the above      Essential hypertension - Primary    Well controlled on current regimen. Renal function stable, no changes today.  Lab Results  Component Value Date   CREATININE 1.30 12/08/2018   Lab Results  Component Value Date   NA 142 12/08/2018   K 4.2 12/08/2018   CL 106 12/08/2018   CO2 25 12/08/2018         Relevant Orders   Comp Met (CMET) (Completed)   Urine Microalbumin w/creat. ratio (Completed)   Fatigue   Relevant Orders   CBC w/Diff (Completed)   Hyperlipidemia    Managed with simvastatin  And fenofibrate.  LDL and triglycerides are at  goal on current medications. Low HDL discussed, He has no side effects and liver enzymes are normal. No changes today    Lab Results  Component Value Date   CHOL 117 12/08/2018   HDL 25 (L) 12/08/2018   LDLCALC 63 12/08/2018   TRIG 219 (H) 12/08/2018   CHOLHDL 4.7 12/08/2018    Lab Results  Component Value Date   ALT 36 12/08/2018   AST 26 12/08/2018   ALKPHOS 51 01/13/2017   BILITOT 0.2 12/08/2018              Relevant Orders   Lipid Profile (Completed)   Prediabetes    I have addressed  BMI and recommended wt loss of 10% of body weight over the next 6 months using a low fat, low starch, high protein  fruit/vegetable based Mediterranean diet and 30 minutes of aerobic exercise a minimum of 5 days per week.   Lab Results  Component Value Date   HGBA1C 6.3 (H) 12/08/2018         Relevant Orders   HgB A1c (Completed)    Other Visit Diagnoses    Prostate cancer screening       Relevant Orders   PSA, total and free (Completed)   Anterior chest wall pain       Relevant Orders   DG Chest 2 View (Completed)      I have discontinued Christopher Vazquez's oseltamivir, azithromycin, and benzonatate. I am also having him maintain his aspirin, fenofibrate, tadalafil, montelukast, pantoprazole, ondansetron, fluticasone, simvastatin, enalapril-hydrochlorothiazide, and traZODone.  No orders of the defined types were placed in this encounter.   Medications Discontinued During This Encounter  Medication Reason  . oseltamivir (TAMIFLU) 75 MG capsule Completed Course  . azithromycin (ZITHROMAX) 250 MG tablet Completed Course  . benzonatate (TESSALON) 100 MG capsule Completed Course    Follow-up: No follow-ups on file.   Crecencio Mc, MD

## 2018-12-08 NOTE — Patient Instructions (Signed)
Diastasis Recti  Diastasis recti is when the muscles of the abdomen (rectus abdominis muscles) become thin and separate. The result is a wider space between the right and left abdomen (abdominal) muscles. This wider space between the muscles may cause a bulge in the middle of your abdomen. You may notice this bulge when you are straining or when you sit up from a lying down position. Diastasis recti can affect men and women. It is most common among pregnant women, infants, people who are obese, and people who have had abdominal surgery. Exercise or surgical treatment may help correct it. What are the causes? Common causes of this condition include:  Pregnancy. The growing uterus puts pressure on the abdominal muscles, which causes the muscles to separate.  Obesity. Excess fat puts pressure on abdominal muscles.  Weightlifting.  Some abdomen exercises.  Advanced age.  Genetics.  Prior abdominal surgery. What increases the risk? This condition is more likely to develop in:  Women.  Newborns, especially newborns who are born early (prematurely). What are the signs or symptoms? Common symptoms of this condition include:  A bulge in the middle of the abdomen. You will notice it most when you sit up or strain.  Pain in the low back, pelvis, or hips.  Constipation.  Inability to control when you urinate (urinary incontinence).  Bloating.  Poor posture. How is this diagnosed? This condition is diagnosed with a physical exam. Your health care provider will ask you to lie flat on your back and do a crunch or half sit-up. If you have diastasis recti, a vertical bulge will appear between your abdominal muscles in the center of your abdomen. Your health care provider will measure the gap between your muscles with one of the following:  A medical device used to measure the space between two objects (caliper).  A tape measure.  CT scan.  Ultrasound.  Finger spaces. Your health  care provider will measure the space using their fingers. How is this treated? If your muscle separation is not too large, you may not need treatment. However, if you are a woman who plans to become pregnant again, you should treat this condition before your next pregnancy. Treatment may include:  Physical therapy to strengthen and tighten your abdominal muscles.  Lifestyle changes such as weight loss and exercise.  Over-the-counter pain medicines as needed.  Surgery to correct the separation. Follow these instructions at home: Activity  Return to your normal activities as told by your health care provider. Ask your health care provider what activities are safe for you.  When lifting weights or doing exercises using your abdominal muscles or the muscles in the center of your body that give stability (core muscles), make sure you are doing your exercises and movements correctly. Proper form can help to prevent the condition from happening again. General instructions  If you are overweight, ask your health care provider for help with weight loss. Losing even a small amount of weight can help to improve your diastasis recti.  Take over-the-counter or prescription medicines only as told by your health care provider.  Do not strain. Straining can make the separation worse. Examples of straining include: ? Pushing hard to have a bowel movement, such as due to constipation. ? Lifting heavy objects, including children. ? Standing up and sitting down.  Take steps to prevent constipation: ? Drink enough fluid to keep your urine clear or pale yellow. ? Take over-the-counter or prescription medicines only as directed. ? Eat foods   that are high in fiber, such as fresh fruits and vegetables, whole grains, and beans. ? Limit foods that are high in fat and processed sugars, such as fried and sweet foods. Contact a health care provider if:  You notice a new bulge in your abdomen. Get help right  away if:  You experience severe discomfort in your abdomen.  You develop severe abdominal pain along with nausea, vomiting, or fever. Summary  Diastasis recti is when the abdomen (abdominal) muscles become thin and separate. Your abdomen will stick out because the space between your right and left abdomen muscles has widened.  The most common symptom is a bulge in your abdomen. You will notice it most when you sit up or are straining.  This condition is diagnosed during a physical exam.  If the abdomen separation is not too big, you may choose not to have treatment. Otherwise, you may need to undergo physical therapy or surgery. This information is not intended to replace advice given to you by your health care provider. Make sure you discuss any questions you have with your health care provider. Document Released: 12/20/2016 Document Revised: 12/20/2016 Document Reviewed: 12/20/2016 Elsevier Interactive Patient Education  2019 [employer]  

## 2018-12-09 LAB — MICROALBUMIN / CREATININE URINE RATIO
Creatinine, Urine: 121 mg/dL (ref 20–320)
Microalb Creat Ratio: 3 mcg/mg creat (ref <30)
Microalb, Ur: 0.4 mg/dL

## 2018-12-10 LAB — CBC WITH DIFFERENTIAL/PLATELET
Absolute Monocytes: 549 cells/uL (ref 200–950)
BASOS ABS: 41 {cells}/uL (ref 0–200)
Basophils Relative: 0.7 %
EOS ABS: 183 {cells}/uL (ref 15–500)
Eosinophils Relative: 3.1 %
HEMATOCRIT: 41.8 % (ref 38.5–50.0)
Hemoglobin: 14.6 g/dL (ref 13.2–17.1)
Lymphs Abs: 2472 cells/uL (ref 850–3900)
MCH: 30.9 pg (ref 27.0–33.0)
MCHC: 34.9 g/dL (ref 32.0–36.0)
MCV: 88.6 fL (ref 80.0–100.0)
MPV: 11.2 fL (ref 7.5–12.5)
Monocytes Relative: 9.3 %
NEUTROS ABS: 2655 {cells}/uL (ref 1500–7800)
Neutrophils Relative %: 45 %
Platelets: 250 10*3/uL (ref 140–400)
RBC: 4.72 10*6/uL (ref 4.20–5.80)
RDW: 13.1 % (ref 11.0–15.0)
Total Lymphocyte: 41.9 %
WBC: 5.9 10*3/uL (ref 3.8–10.8)

## 2018-12-10 LAB — COMPREHENSIVE METABOLIC PANEL
AG RATIO: 2 (calc) (ref 1.0–2.5)
ALBUMIN MSPROF: 4.5 g/dL (ref 3.6–5.1)
ALT: 36 U/L (ref 9–46)
AST: 26 U/L (ref 10–35)
Alkaline phosphatase (APISO): 49 U/L (ref 40–115)
BILIRUBIN TOTAL: 0.2 mg/dL (ref 0.2–1.2)
BUN: 18 mg/dL (ref 7–25)
CALCIUM: 9.7 mg/dL (ref 8.6–10.3)
CO2: 25 mmol/L (ref 20–32)
Chloride: 106 mmol/L (ref 98–110)
Creat: 1.3 mg/dL (ref 0.70–1.33)
Globulin: 2.2 g/dL (calc) (ref 1.9–3.7)
Glucose, Bld: 125 mg/dL — ABNORMAL HIGH (ref 65–99)
POTASSIUM: 4.2 mmol/L (ref 3.5–5.3)
SODIUM: 142 mmol/L (ref 135–146)
TOTAL PROTEIN: 6.7 g/dL (ref 6.1–8.1)

## 2018-12-10 LAB — LIPID PANEL
CHOLESTEROL: 117 mg/dL (ref <200)
HDL: 25 mg/dL — ABNORMAL LOW (ref >40)
LDL Cholesterol (Calc): 63 mg/dL (calc)
Non-HDL Cholesterol (Calc): 92 mg/dL (calc) (ref <130)
Total CHOL/HDL Ratio: 4.7 (calc) (ref <5.0)
Triglycerides: 219 mg/dL — ABNORMAL HIGH (ref <150)

## 2018-12-10 LAB — PSA, TOTAL AND FREE
PSA, % Free: 50 % (calc) (ref >25)
PSA, FREE: 0.2 ng/mL
PSA, TOTAL: 0.4 ng/mL (ref <=4.0)

## 2018-12-10 LAB — HEMOGLOBIN A1C
EAG (MMOL/L): 7.4 (calc)
HEMOGLOBIN A1C: 6.3 %{Hb} — AB (ref <5.7)
MEAN PLASMA GLUCOSE: 134 (calc)

## 2018-12-10 NOTE — Assessment & Plan Note (Signed)
I have addressed  BMI and recommended wt loss of 10% of body weight over the next 6 months using a low fat, low starch, high protein  fruit/vegetable based Mediterranean diet and 30 minutes of aerobic exercise a minimum of 5 days per week.   Lab Results  Component Value Date   HGBA1C 6.3 (H) 12/08/2018

## 2018-12-10 NOTE — Assessment & Plan Note (Signed)
Well controlled on current regimen. Renal function stable, no changes today.  Lab Results  Component Value Date   CREATININE 1.30 12/08/2018   Lab Results  Component Value Date   NA 142 12/08/2018   K 4.2 12/08/2018   CL 106 12/08/2018   CO2 25 12/08/2018

## 2018-12-10 NOTE — Assessment & Plan Note (Signed)

## 2018-12-10 NOTE — Assessment & Plan Note (Signed)
Managed with simvastatin  And fenofibrate.  LDL and triglycerides are at goal on current medications. Low HDL discussed, He has no side effects and liver enzymes are normal. No changes today    Lab Results  Component Value Date   CHOL 117 12/08/2018   HDL 25 (L) 12/08/2018   LDLCALC 63 12/08/2018   TRIG 219 (H) 12/08/2018   CHOLHDL 4.7 12/08/2018    Lab Results  Component Value Date   ALT 36 12/08/2018   AST 26 12/08/2018   ALKPHOS 51 01/13/2017   BILITOT 0.2 12/08/2018

## 2018-12-11 ENCOUNTER — Other Ambulatory Visit: Payer: Self-pay | Admitting: Internal Medicine

## 2018-12-11 MED ORDER — ONDANSETRON 8 MG PO TBDP: 8.0000 mg | ORAL_TABLET | Freq: Three times a day (TID) | ORAL | 0 refills | Status: DC | PRN

## 2018-12-11 MED ORDER — PREDNISONE 10 MG PO TABS: ORAL_TABLET | ORAL | 0 refills | Status: DC

## 2018-12-11 MED ORDER — OSELTAMIVIR PHOSPHATE 75 MG PO CAPS: 75.0000 mg | ORAL_CAPSULE | Freq: Every day | ORAL | 0 refills | Status: DC

## 2019-01-07 ENCOUNTER — Ambulatory Visit
Admission: EM | Admit: 2019-01-07 | Discharge: 2019-01-07 | Disposition: A | Discharge status: Home / Self Care | Payer: 59 | Attending: Family Medicine | Admitting: Family Medicine

## 2019-01-07 ENCOUNTER — Other Ambulatory Visit: Payer: Self-pay

## 2019-01-07 ENCOUNTER — Encounter: Payer: Self-pay | Admitting: Emergency Medicine

## 2019-01-07 DIAGNOSIS — J111 Influenza due to unidentified influenza virus with other respiratory manifestations: Secondary | ICD-10-CM

## 2019-01-07 DIAGNOSIS — R0981 Nasal congestion: Secondary | ICD-10-CM

## 2019-01-07 DIAGNOSIS — R51 Headache: Secondary | ICD-10-CM

## 2019-01-07 DIAGNOSIS — R509 Fever, unspecified: Secondary | ICD-10-CM | POA: Diagnosis not present

## 2019-01-07 DIAGNOSIS — R05 Cough: Secondary | ICD-10-CM | POA: Diagnosis not present

## 2019-01-07 DIAGNOSIS — R69 Illness, unspecified: Principal | ICD-10-CM

## 2019-01-07 MED ORDER — ACETAMINOPHEN 500 MG PO TABS
1000.0000 mg | ORAL_TABLET | Freq: Once | ORAL | Status: AC
  Administered 2019-01-07: 1000 mg via ORAL

## 2019-01-07 MED ORDER — HYDROCOD POLST-CPM POLST ER 10-8 MG/5ML PO SUER: 5.0000 mL | Freq: Two times a day (BID) | ORAL | 0 refills | Status: DC | PRN

## 2019-01-07 NOTE — ED Provider Notes (Signed)
[facility]    CSN: [id number] Arrival date & time: 01/07/19  1056     History   Chief Complaint Chief Complaint  Patient presents with  . Cough    APPT  . Headache    HPI Christopher Vazquez is a 53 y.o. male.   The history is provided by the patient.  URI  Presenting symptoms: congestion, fatigue, fever and rhinorrhea   Severity:  Moderate Onset quality:  Sudden Duration:  2 days Timing:  Constant Progression:  Worsening Chronicity:  New Relieved by:  None tried Associated symptoms: myalgias   Risk factors: recent travel and sick contacts     Past Medical History:  Diagnosis Date  . Complication of anesthesia    with tonsillectomy sever swelling of throat, possible laryngiospasm.  Marland Kitchen GERD (gastroesophageal reflux disease)   . Hyperlipidemia   . Hypertension     Patient Active Problem List   Diagnosis Date Noted  . Special screening for malignant neoplasms, colon   . Encounter for preventive health examination 10/23/2017  . Vasomotor flushing 02/10/2016  . Musculoskeletal pain 02/04/2016  . Prediabetes 08/18/2015  . Obesity 02/09/2015  . Vitamin D deficiency 02/19/2014  . Seasonal allergic rhinitis 02/02/2014  . Dysphagia, unspecified(787.20) 03/07/2013  . Hepatic steatosis 03/07/2013  . Polyarthritis 01/07/2012  . Fatigue 11/14/2011  . Hyperlipidemia   . Essential hypertension     Past Surgical History:  Procedure Laterality Date  . CHOLESTEATOMA EXCISION Left 2000   left ear  . COLONOSCOPY WITH PROPOFOL N/A 11/17/2017   Procedure: COLONOSCOPY WITH PROPOFOL;  Surgeon: Lin Landsman, MD;  Location: [facility] ENDOSCOPY;  Service: Gastroenterology;  Laterality: N/A;  . HERNIA REPAIR    . KNEE ARTHROSCOPY WITH MEDIAL MENISECTOMY Left 07/01/2016   Procedure: left knee arthroscopy with partial medial menisectomy;  Surgeon: Hessie Knows, MD;  Location: [facility] ORS;  Service: Orthopedics;  Laterality: Left;  . TONSILLECTOMY AND ADENOIDECTOMY          Home Medications    Prior to Admission medications   Medication Sig Start Date End Date Taking? Authorizing Provider  aspirin 81 MG tablet Take 81 mg by mouth at bedtime.    Yes [provider]  enalapril-hydrochlorothiazide (VASERETIC) 10-25 MG tablet TAKE 1 TABLET BY MOUTH EVERY DAY 10/30/18  Yes Crecencio Mc, MD  fenofibrate 160 MG tablet TAKE 1 TABLET BY MOUTH DAILY 02/21/18  Yes Crecencio Mc, MD  fluticasone (FLONASE) 50 MCG/ACT nasal spray SPRAY 2 SPRAYS INTO EACH NOSTRIL EVERY DAY 09/19/18  Yes Crecencio Mc, MD  montelukast (SINGULAIR) 10 MG tablet TAKE 1 TABLET BY MOUTH EVERYDAY AT BEDTIME 08/18/18  Yes Crecencio Mc, MD  ondansetron (ZOFRAN ODT) 8 MG disintegrating tablet Take 1 tablet (8 mg total) by mouth every 8 (eight) hours as needed for nausea or vomiting. 12/11/18  Yes Crecencio Mc, MD  pantoprazole (PROTONIX) 40 MG tablet TAKE 1 TABLET BY MOUTH EVERY DAY 08/21/18  Yes Crecencio Mc, MD  simvastatin (ZOCOR) 20 MG tablet TAKE 1 TABLET BY MOUTH EVERYDAY AT BEDTIME 10/20/18  Yes Crecencio Mc, MD  traZODone (DESYREL) 100 MG tablet TAKE 1 TABLET (100 MG TOTAL) BY MOUTH AT BEDTIME AS NEEDED FOR SLEEP. 11/16/18  Yes Crecencio Mc, MD  chlorpheniramine-HYDROcodone (TUSSIONEX PENNKINETIC ER) 10-8 MG/5ML SUER Take 5 mLs by mouth every 12 (twelve) hours as needed. 01/07/19   Norval Gable, MD  oseltamivir (TAMIFLU) 75 MG capsule Take 1 capsule (75 mg total) by mouth  daily. 12/11/18   Crecencio Mc, MD  predniSONE (DELTASONE) 10 MG tablet 6 tablets on Day 1 , then reduce by 1 tablet daily until gone 12/11/18   Crecencio Mc, MD  tadalafil (ADCIRCA/CIALIS) 20 MG tablet Take 1 tablet (20 mg total) by mouth every other day as needed for erectile dysfunction. 04/11/18   Crecencio Mc, MD    Family History Family History  Problem Relation Age of Onset  . Diabetes Mother   . Hypertension Mother   . Uterine cancer Mother   . Aortic aneurysm Father 52  .  Heart disease Father 28  . AAA (abdominal aortic aneurysm) Father     Social History Social History   Tobacco Use  . Smoking status: Never Smoker  . Smokeless tobacco: Never Used  Substance Use Topics  . Alcohol use: Yes    Comment: 1-2 drinks every 3-4 months  . Drug use: No     Allergies   Scopolamine; Shellfish allergy; and Niacin and related   Review of Systems Review of Systems  Constitutional: Positive for fatigue and fever.  HENT: Positive for congestion and rhinorrhea.   Musculoskeletal: Positive for myalgias.     Physical Exam Triage Vital Signs ED Triage Vitals  Enc Vitals Group     BP 01/07/19 1151 (!) 140/93     Pulse Rate 01/07/19 1151 (!) 105     Resp 01/07/19 1151 16     Temp 01/07/19 1151 (!) 100.7 F (38.2 C)     Temp Source 01/07/19 1151 Oral     SpO2 01/07/19 1151 98 %     Weight 01/07/19 1148 222 lb (100.7 kg)     Height 01/07/19 1148 5\' 7"  (1.702 m)     Head Circumference --      Peak Flow --      Pain Score 01/07/19 1147 2     Pain Loc --      Pain Edu? --      Excl. in [place]? --    No data found.  Updated Vital Signs BP (!) 140/93 (BP Location: Left Arm)   Pulse (!) 105   Temp (!) 100.7 F (38.2 C) (Oral)   Resp 16   Ht 5\' 7"  (1.702 m)   Wt 100.7 kg   SpO2 98%   BMI 34.77 kg/m   Visual Acuity Right Eye Distance:   Left Eye Distance:   Bilateral Distance:    Right Eye Near:   Left Eye Near:    Bilateral Near:     Physical Exam Vitals signs and nursing note reviewed.  Constitutional:      General: He is not in acute distress.    Appearance: He is well-developed. He is not toxic-appearing or diaphoretic.  HENT:     Mouth/Throat:     Pharynx: Uvula midline. No oropharyngeal exudate.     Tonsils: No tonsillar abscesses.  Neck:     Musculoskeletal: Normal range of motion and neck supple.     Thyroid: No thyromegaly.     Trachea: No tracheal deviation.  Cardiovascular:     Rate and Rhythm: Normal rate and regular  rhythm.     Heart sounds: Normal heart sounds.  Pulmonary:     Effort: Pulmonary effort is normal. No respiratory distress.     Breath sounds: Normal breath sounds. No stridor. No wheezing, rhonchi or rales.  Chest:     Chest wall: No tenderness.  Lymphadenopathy:     Cervical: No cervical  adenopathy.  Skin:    General: Skin is warm and dry.  Neurological:     Mental Status: He is alert.      UC Treatments / Results  Labs (all labs ordered are listed, but only abnormal results are displayed) Labs Reviewed - No data to display  EKG None  Radiology No results found.  Procedures Procedures (including critical care time)  Medications Ordered in UC Medications  acetaminophen (TYLENOL) tablet 1,000 mg (1,000 mg Oral Given 01/07/19 1153)    Initial Impression / Assessment and Plan / UC Course  I have reviewed the triage vital signs and the nursing notes.  Pertinent labs & imaging results that were available during my care of the patient were reviewed by me and considered in my medical decision making (see chart for details).      Final Clinical Impressions(s) / UC Diagnoses   Final diagnoses:  Influenza-like illness     Discharge Instructions     Start the Tamiflu rx you have at home Zofran as needed Rest, fluids    ED Prescriptions    Medication Sig Dispense Auth. Provider   chlorpheniramine-HYDROcodone (TUSSIONEX PENNKINETIC ER) 10-8 MG/5ML SUER Take 5 mLs by mouth every 12 (twelve) hours as needed. 60 mL Norval Gable, MD      1. diagnosis reviewed with patient; patient has an rx for tamiflu at home 2. rx as per orders above; reviewed possible side effects, interactions, risks and benefits  3. Recommend supportive treatment as above 4. Follow-up prn if symptoms worsen or don't improve   Controlled Substance Prescriptions [place] Controlled Substance Registry consulted? Not Applicable   Norval Gable, MD 01/07/19 1314

## 2019-01-07 NOTE — Discharge Instructions (Signed)
Start the Tamiflu rx you have at home Zofran as needed Rest, fluids

## 2019-01-07 NOTE — ED Triage Notes (Signed)
Patient c/o cough, sinus congestion and HAs that started on Tuesday.  Patient reports fever started yesterday.  Patient finished Prednsione and Cipro.

## 2019-01-13 ENCOUNTER — Other Ambulatory Visit: Payer: Self-pay | Admitting: Internal Medicine

## 2019-01-19 ENCOUNTER — Other Ambulatory Visit: Payer: Self-pay | Admitting: Internal Medicine

## 2019-01-23 ENCOUNTER — Ambulatory Visit: Payer: 59

## 2019-02-08 ENCOUNTER — Other Ambulatory Visit: Payer: Self-pay | Admitting: Internal Medicine

## 2019-02-17 ENCOUNTER — Other Ambulatory Visit: Payer: Self-pay | Admitting: Internal Medicine

## 2019-02-20 ENCOUNTER — Encounter: Payer: Self-pay | Admitting: Family Medicine

## 2019-02-20 ENCOUNTER — Ambulatory Visit (INDEPENDENT_AMBULATORY_CARE_PROVIDER_SITE_OTHER): Payer: 59 | Admitting: Family Medicine

## 2019-02-20 ENCOUNTER — Telehealth: Payer: Self-pay

## 2019-02-20 ENCOUNTER — Other Ambulatory Visit: Payer: Self-pay

## 2019-02-20 DIAGNOSIS — M791 Myalgia, unspecified site: Secondary | ICD-10-CM | POA: Diagnosis not present

## 2019-02-20 DIAGNOSIS — M542 Cervicalgia: Secondary | ICD-10-CM | POA: Diagnosis not present

## 2019-02-20 MED ORDER — CYCLOBENZAPRINE HCL 5 MG PO TABS: 5.0000 mg | ORAL_TABLET | Freq: Three times a day (TID) | ORAL | 1 refills | Status: DC | PRN

## 2019-02-20 MED ORDER — METHYLPREDNISOLONE 4 MG PO TBPK: ORAL_TABLET | ORAL | 0 refills | Status: DC

## 2019-02-20 NOTE — Progress Notes (Signed)
Patient ID: Christopher Vazquez, male   DOB: September 14, 1966, 53 y.o.   MRN: [id number]  Virtual Visit via video Note  This visit type was conducted due to national recommendations for restrictions regarding the COVID-19 pandemic (e.g. social distancing).  This format is felt to be most appropriate for this patient at this time.  All issues noted in this document were discussed and addressed.  No physical exam was performed (except for noted visual exam findings with Video Visits).   I connected with Lawernce Keas on 02/20/19 at 11:20 AM EDT by a video enabled telemedicine application and verified that I am speaking with the correct person using two identifiers. Location patient: home Location provider: [facility] Persons participating in the virtual visit: patient, provider  I discussed the limitations, risks, security and privacy concerns of performing an evaluation and management service by video and the availability of in person appointments. I also discussed with the patient that there may be a patient responsible charge related to this service. The patient expressed understanding and agreed to proceed.   HPI:  Patient and I connected via video to discuss left-sided neck pain.  Patient states the neck pain is been present for about a week.  States he also had some left ear pain over the weekend, but that has since resolved.  Patient states at first the left ear pain scared him because he has had a cholesteatoma removed from behind left ear.  However, the ear pain has improved and the pain now seems to be more so in the left side of neck and is a pulling type pain especially when turning head from side to side.  Patient does have to walk around a lot for his job, and also spends a good amount of time typing on his computer.  Denies any numbness or tingling in extremities.  Denies any issues with gripping. Patient has been using tylenol to help pain with minimal effect in reducing pain.  Denies any  known injury to the neck.  Has not used any over-the-counter topical rubs to help reduce pain.    Review of Systems   Constitutional: Negative for chills, fatigue and fever.  HENT: Negative for congestion, sinus pain and sore throat. +left ear pain last week, now resolved. +mild runny nose, clear Eyes: Negative.   Respiratory: Negative for cough, shortness of breath and wheezing.   Cardiovascular: Negative for chest pain, palpitations and leg swelling.  Gastrointestinal: Negative for abdominal pain, diarrhea, nausea and vomiting.  Genitourinary: Negative for dysuria, frequency and urgency.  Musculoskeletal: +left sided neck pain Skin: Negative for color change, pallor and rash.  Neurological: Negative for syncope, light-headedness and headaches.  Psychiatric/Behavioral: The patient is not nervous/anxious.     Past Medical History:  Diagnosis Date  . Complication of anesthesia    with tonsillectomy sever swelling of throat, possible laryngiospasm.  Marland Kitchen GERD (gastroesophageal reflux disease)   . Hyperlipidemia   . Hypertension     Past Surgical History:  Procedure Laterality Date  . CHOLESTEATOMA EXCISION Left 2000   left ear  . COLONOSCOPY WITH PROPOFOL N/A 11/17/2017   Procedure: COLONOSCOPY WITH PROPOFOL;  Surgeon: Lin Landsman, MD;  Location: [facility] ENDOSCOPY;  Service: Gastroenterology;  Laterality: N/A;  . HERNIA REPAIR    . KNEE ARTHROSCOPY WITH MEDIAL MENISECTOMY Left 07/01/2016   Procedure: left knee arthroscopy with partial medial menisectomy;  Surgeon: Hessie Knows, MD;  Location: [facility] ORS;  Service: Orthopedics;  Laterality: Left;  . TONSILLECTOMY AND ADENOIDECTOMY  Family History  Problem Relation Age of Onset  . Diabetes Mother   . Hypertension Mother   . Uterine cancer Mother   . Aortic aneurysm Father 32  . Heart disease Father 34  . AAA (abdominal aortic aneurysm) Father     Social History   Tobacco Use  . Smoking status: Never Smoker  .  Smokeless tobacco: Never Used  Substance Use Topics  . Alcohol use: Yes    Comment: 1-2 drinks every 3-4 months    Current Outpatient Medications:  .  aspirin 81 MG tablet, Take 81 mg by mouth at bedtime. , Disp: , Rfl:  .  chlorpheniramine-HYDROcodone (TUSSIONEX PENNKINETIC ER) 10-8 MG/5ML SUER, Take 5 mLs by mouth every 12 (twelve) hours as needed., Disp: 60 mL, Rfl: 0 .  enalapril-hydrochlorothiazide (VASERETIC) 10-25 MG tablet, TAKE 1 TABLET BY MOUTH EVERY DAY, Disp: 31 tablet, Rfl: 2 .  fenofibrate 160 MG tablet, TAKE 1 TABLET BY MOUTH EVERY DAY, Disp: 90 tablet, Rfl: 1 .  fluticasone (FLONASE) 50 MCG/ACT nasal spray, SPRAY 2 SPRAYS INTO EACH NOSTRIL EVERY DAY, Disp: 16 g, Rfl: 1 .  montelukast (SINGULAIR) 10 MG tablet, TAKE 1 TABLET BY MOUTH EVERYDAY AT BEDTIME, Disp: 90 tablet, Rfl: 1 .  ondansetron (ZOFRAN ODT) 8 MG disintegrating tablet, Take 1 tablet (8 mg total) by mouth every 8 (eight) hours as needed for nausea or vomiting., Disp: 20 tablet, Rfl: 0 .  pantoprazole (PROTONIX) 40 MG tablet, TAKE 1 TABLET BY MOUTH EVERY DAY, Disp: 90 tablet, Rfl: 1 .  predniSONE (DELTASONE) 10 MG tablet, 6 tablets on Day 1 , then reduce by 1 tablet daily until gone, Disp: 21 tablet, Rfl: 0 .  simvastatin (ZOCOR) 20 MG tablet, TAKE 1 TABLET BY MOUTH EVERYDAY AT BEDTIME, Disp: 90 tablet, Rfl: 1 .  tadalafil (ADCIRCA/CIALIS) 20 MG tablet, Take 1 tablet (20 mg total) by mouth every other day as needed for erectile dysfunction., Disp: 5 tablet, Rfl: 11 .  traZODone (DESYREL) 100 MG tablet, TAKE 1 TABLET (100 MG TOTAL) BY MOUTH AT BEDTIME AS NEEDED FOR SLEEP., Disp: 90 tablet, Rfl: 1  EXAM:  GENERAL: alert, oriented, appears well and in no acute distress  HEENT: atraumatic, conjunttiva clear, no obvious abnormalities on inspection of external nose and ears  NECK: normal movements of the head and neck.  Able to turn head side to side up and down and do a circular rotation motion of head and neck without  issue.  LUNGS: on inspection no signs of respiratory distress, breathing rate appears normal, no obvious gross SOB, gasping or wheezing  CV: no obvious cyanosis  MS: moves all visible extremities without noticeable abnormality  PSYCH/NEURO: pleasant and cooperative, no obvious depression or anxiety, speech and thought processing grossly intact  ASSESSMENT AND PLAN:  Discussed the following assessment and plan:  Neck pain - Plan: methylPREDNISolone (MEDROL DOSEPAK) 4 MG TBPK tablet, cyclobenzaprine (FLEXERIL) 5 MG tablet  Muscle ache - Plan: methylPREDNISolone (MEDROL DOSEPAK) 4 MG TBPK tablet, cyclobenzaprine (FLEXERIL) 5 MG tablet  Due to patient's description of symptoms, I strongly suspect his pain is musculoskeletal in nature.  We will treat with short prednisone taper and cyclobenzaprine for pain & muscle spasms.  Discussed topical rubs that can be quite helpful for pain reduction such as BenGay or Biofreeze as well as a heating pack.  Also recommended patient do range of motion exercises of the neck at least 2-3 times a day to help get muscles and motion and reduce stiffness.  I discussed the assessment and treatment plan with the patient. The patient was provided an opportunity to ask questions and all were answered. The patient agreed with the plan and demonstrated an understanding of the instructions.   The patient was advised to call back or seek an in-person evaluation if the symptoms worsen or if the condition fails to improve as anticipated.   Jodelle Green, FNP

## 2019-02-20 NOTE — Telephone Encounter (Signed)
Pt has visit with NP Philis Nettle today at 11:20am

## 2019-02-20 NOTE — Telephone Encounter (Signed)
Copied from [place] [id number]. Topic: Appointment Scheduling - Scheduling Inquiry for Clinic >> Feb 19, 2019  4:19 PM Rutherford Nail, [place] wrote: Reason for CRM: Patient calling and states that he would like to make an appointment regarding some neck pain that he has been having. States that the neck pain is causing headaches also. Please advise.  CB#: [phone]

## 2019-02-20 NOTE — Telephone Encounter (Signed)
Called and spoke to pt.  Pt c/o of neck pain and pain when turning head to the right.  Also c/o of headaches in back of head.  Some stiffness.  No fever.  Pt has taken Tylenol and says it helps some.  Pt not aware of any injuries to neck.  Pt says pain is 5 or 6 out of 10 on pain scale.  Pt scheduled [facility] appt today w/ Philis Nettle, NP @ 11:20 am.

## 2019-03-21 ENCOUNTER — Other Ambulatory Visit: Payer: Self-pay

## 2019-03-21 ENCOUNTER — Ambulatory Visit (INDEPENDENT_AMBULATORY_CARE_PROVIDER_SITE_OTHER): Payer: 59 | Admitting: Family Medicine

## 2019-03-21 DIAGNOSIS — L03116 Cellulitis of left lower limb: Secondary | ICD-10-CM

## 2019-03-21 DIAGNOSIS — S81812A Laceration without foreign body, left lower leg, initial encounter: Secondary | ICD-10-CM

## 2019-03-21 MED ORDER — CEPHALEXIN 500 MG PO CAPS: 500.0000 mg | ORAL_CAPSULE | Freq: Four times a day (QID) | ORAL | 0 refills | Status: DC

## 2019-03-21 NOTE — Progress Notes (Signed)
Patient ID: Christopher Vazquez, male   DOB: 1965/11/30, 53 y.o.   MRN: [id number]    Virtual Visit via Video Note  This visit type was conducted due to national recommendations for restrictions regarding the COVID-19 pandemic (e.g. social distancing).  This format is felt to be most appropriate for this patient at this time.  All issues noted in this document were discussed and addressed.  No physical exam was performed (except for noted visual exam findings with Video Visits).   I connected with Vonna Drafts today at  9:00 AM EDT by a video enabled telemedicine application  and verified that I am speaking with the correct person using two identifiers. Location patient: home Location provider: [facility] Persons participating in the virtual visit: patient, provider  I discussed the limitations, risks, security and privacy concerns of performing an evaluation and management service by video and the availability of in person appointments. I also discussed with the patient that there may be a patient responsible charge related to this service. The patient expressed understanding and agreed to proceed.   HPI:  Patient and I connected via video due to concerns over infection and left lower extremity.  Patient was building a cabinet last week, corner of cabinet slipped and caused a cut in his left lower leg.  Patient was able to control the bleeding easily, wash area with soap and water put some Neosporin on it a Band-Aid and went on about his day.  Patient noticed over the weekend that the area began to become little more sore and surrounding skin became red.  At that point his wife suggested he call office to have area evaluated.  Patient did buy peroxide last night and wash area with peroxide.  Patient states since doing this, redness and soreness of area is somewhat better but still is present.  Tetanus shot is up-to-date, last gotten in 2015.  Denies fever or chills.  Denies cough, shortness of  breath or wheezing.  Denies body aches.  Denies chest pain or palpitations.  Denies GI or GU issues.   ROS: See pertinent positives and negatives per HPI.  Past Medical History:  Diagnosis Date  . Complication of anesthesia    with tonsillectomy sever swelling of throat, possible laryngiospasm.  Marland Kitchen GERD (gastroesophageal reflux disease)   . Hyperlipidemia   . Hypertension     Past Surgical History:  Procedure Laterality Date  . CHOLESTEATOMA EXCISION Left 2000   left ear  . COLONOSCOPY WITH PROPOFOL N/A 11/17/2017   Procedure: COLONOSCOPY WITH PROPOFOL;  Surgeon: Lin Landsman, MD;  Location[facility] END[facility]COPY;  Service: Gastroenterology;  Laterality: N/A;  . HERNIA REPAIR    . KNEE ARTHROSCOPY WITH MEDIAL MENISECTOMY Left 07/01/2016   Procedure: left knee arthroscopy with partial medial menisectomy;  Surgeon: Hessie Knows, MD;  Location: [facility] ORS;  Service: Orthopedics;  Laterality: Left;  . TONSILLECTOMY AND ADENOIDECTOMY      Family History  Problem Relation Age of Onset  . Diabetes Mother   . Hypertension Mother   . Uterine cancer Mother   . Aortic aneurysm Father 22  . Heart disease Father 39  . AAA (abdominal aortic aneurysm) Father     Social History   Tobacco Use  . Smoking status: Never Smoker  . Smokeless tobacco: Never Used  Substance Use Topics  . Alcohol use: Yes    Comment: 1-2 drinks every 3-4 months    Current Outpatient Medications:  .  aspirin 81 MG tablet, Take 81  mg by mouth at bedtime. , Disp: , Rfl:  .  chlorpheniramine-HYDROcodone (TUSSIONEX PENNKINETIC ER) 10-8 MG/5ML SUER, Take 5 mLs by mouth every 12 (twelve) hours as needed., Disp: 60 mL, Rfl: 0 .  cyclobenzaprine (FLEXERIL) 5 MG tablet, Take 1 tablet (5 mg total) by mouth 3 (three) times daily as needed for muscle spasms., Disp: 30 tablet, Rfl: 1 .  enalapril-hydrochlorothiazide (VASERETIC) 10-25 MG tablet, TAKE 1 TABLET BY MOUTH EVERY DAY, Disp: 31 tablet, Rfl: 2 .  fenofibrate 160 MG  tablet, TAKE 1 TABLET BY MOUTH EVERY DAY, Disp: 90 tablet, Rfl: 1 .  fluticasone (FLONASE) 50 MCG/ACT nasal spray, SPRAY 2 SPRAYS INTO EACH NOSTRIL EVERY DAY, Disp: 16 g, Rfl: 1 .  methylPREDNISolone (MEDROL DOSEPAK) 4 MG TBPK tablet, Take according to pack instructions, Disp: 21 tablet, Rfl: 0 .  montelukast (SINGULAIR) 10 MG tablet, TAKE 1 TABLET BY MOUTH EVERYDAY AT BEDTIME, Disp: 90 tablet, Rfl: 1 .  ondansetron (ZOFRAN ODT) 8 MG disintegrating tablet, Take 1 tablet (8 mg total) by mouth every 8 (eight) hours as needed for nausea or vomiting., Disp: 20 tablet, Rfl: 0 .  pantoprazole (PROTONIX) 40 MG tablet, TAKE 1 TABLET BY MOUTH EVERY DAY, Disp: 90 tablet, Rfl: 1 .  predniSONE (DELTASONE) 10 MG tablet, 6 tablets on Day 1 , then reduce by 1 tablet daily until gone, Disp: 21 tablet, Rfl: 0 .  simvastatin (ZOCOR) 20 MG tablet, TAKE 1 TABLET BY MOUTH EVERYDAY AT BEDTIME, Disp: 90 tablet, Rfl: 1 .  tadalafil (ADCIRCA/CIALIS) 20 MG tablet, Take 1 tablet (20 mg total) by mouth every other day as needed for erectile dysfunction., Disp: 5 tablet, Rfl: 11 .  traZODone (DESYREL) 100 MG tablet, TAKE 1 TABLET (100 MG TOTAL) BY MOUTH AT BEDTIME AS NEEDED FOR SLEEP., Disp: 90 tablet, Rfl: 1  EXAM:  GENERAL: alert, oriented, appears well and in no acute distress  HEENT: atraumatic, conjunttiva clear, no obvious abnormalities on inspection of external nose and ears  NECK: normal movements of the head and neck  LUNGS: on inspection no signs of respiratory distress, breathing rate appears normal, no obvious gross SOB, gasping or wheezing  CV: no obvious cyanosis  SKIN: Area of redness on left lower leg about size of silver dollar surrounding scabbed area (scab is size of dime). Appears consistent with a cellulitis caused by skin laceration.   MS: moves all visible extremities without noticeable abnormality  PSYCH/NEURO: pleasant and cooperative, no obvious depression or anxiety, speech and thought  processing grossly intact  ASSESSMENT AND PLAN:  Discussed the following assessment and plan:  Cellulitis of left lower extremity - Plan: cephALEXin (KEFLEX) 500 MG capsule  Laceration of skin of left lower leg, initial encounter  Due to area of redness that is surrounding the scab on the skin, we will treat with Keflex 4 times daily for 10 days to cover for cellulitis infection.  Patient advised to wash area daily with either mild soap and water and or hydrogen peroxide, pat dry and apply thin layer bacitracin.  Advised to monitor skin for any worsening redness, advised to monitor self for any development of fever or increasing pain.     I discussed the assessment and treatment plan with the patient. The patient was provided an opportunity to ask questions and all were answered. The patient agreed with the plan and demonstrated an understanding of the instructions.   The patient was advised to call back or seek an in-person evaluation if the symptoms worsen  or if the condition fails to improve as anticipated.   Jodelle Green, FNP

## 2019-04-04 ENCOUNTER — Other Ambulatory Visit: Payer: Self-pay | Admitting: Internal Medicine

## 2019-04-04 MED ORDER — SILDENAFIL CITRATE 20 MG PO TABS: 20.0000 mg | ORAL_TABLET | Freq: Three times a day (TID) | ORAL | 0 refills | Status: DC

## 2019-04-30 ENCOUNTER — Telehealth: Payer: Self-pay | Admitting: *Deleted

## 2019-04-30 DIAGNOSIS — R7303 Prediabetes: Secondary | ICD-10-CM

## 2019-04-30 NOTE — Telephone Encounter (Signed)
Place lab order

## 2019-05-02 ENCOUNTER — Other Ambulatory Visit: Payer: Self-pay | Admitting: Internal Medicine

## 2019-05-02 MED ORDER — TELMISARTAN-HCTZ 80-25 MG PO TABS: 1.0000 | ORAL_TABLET | Freq: Every day | ORAL | 0 refills | Status: DC

## 2019-05-16 ENCOUNTER — Other Ambulatory Visit: Payer: Self-pay

## 2019-05-16 MED ORDER — MONTELUKAST SODIUM 10 MG PO TABS: ORAL_TABLET | ORAL | 1 refills | Status: DC

## 2019-05-16 MED ORDER — SIMVASTATIN 20 MG PO TABS: ORAL_TABLET | ORAL | 1 refills | Status: DC

## 2019-05-16 MED ORDER — FENOFIBRATE 160 MG PO TABS: ORAL_TABLET | ORAL | 1 refills | Status: DC

## 2019-05-16 MED ORDER — TELMISARTAN-HCTZ 80-25 MG PO TABS: 1.0000 | ORAL_TABLET | Freq: Every day | ORAL | 1 refills | Status: DC

## 2019-05-22 ENCOUNTER — Other Ambulatory Visit: Payer: Self-pay | Admitting: Internal Medicine

## 2019-05-25 MED ORDER — TRAZODONE HCL 100 MG PO TABS: 100.0000 mg | ORAL_TABLET | Freq: Every evening | ORAL | 1 refills | Status: DC | PRN

## 2019-06-13 ENCOUNTER — Other Ambulatory Visit (INDEPENDENT_AMBULATORY_CARE_PROVIDER_SITE_OTHER): Payer: BC Managed Care – PPO

## 2019-06-13 ENCOUNTER — Other Ambulatory Visit: Payer: Self-pay

## 2019-06-13 DIAGNOSIS — R7303 Prediabetes: Secondary | ICD-10-CM | POA: Diagnosis not present

## 2019-06-14 LAB — HEMOGLOBIN A1C
Hgb A1c MFr Bld: 6.4 % of total Hgb — ABNORMAL HIGH (ref <5.7)
Mean Plasma Glucose: 137 (calc)
eAG (mmol/L): 7.6 (calc)

## 2019-06-17 ENCOUNTER — Other Ambulatory Visit: Payer: Self-pay | Admitting: Internal Medicine

## 2019-06-17 DIAGNOSIS — R7303 Prediabetes: Secondary | ICD-10-CM

## 2019-06-17 DIAGNOSIS — K760 Fatty (change of) liver, not elsewhere classified: Secondary | ICD-10-CM

## 2019-06-17 DIAGNOSIS — E7800 Pure hypercholesterolemia, unspecified: Secondary | ICD-10-CM

## 2019-06-29 IMAGING — US US HEPATIC LIVER DOPPLER
1 series · 13 of 25 positions shown · non-contrast
Comparison: None.

CLINICAL DATA: Hepatic steatosis

EXAM:
DUPLEX ULTRASOUND OF LIVER
TECHNIQUE: Color and duplex Doppler ultrasound was performed to evaluate the
hepatic in-flow and out-flow vessels.

[Series 1: us hepatic liver doppler · 0.28mm/px · 13 of 79 slices shown]
[im 1/79]
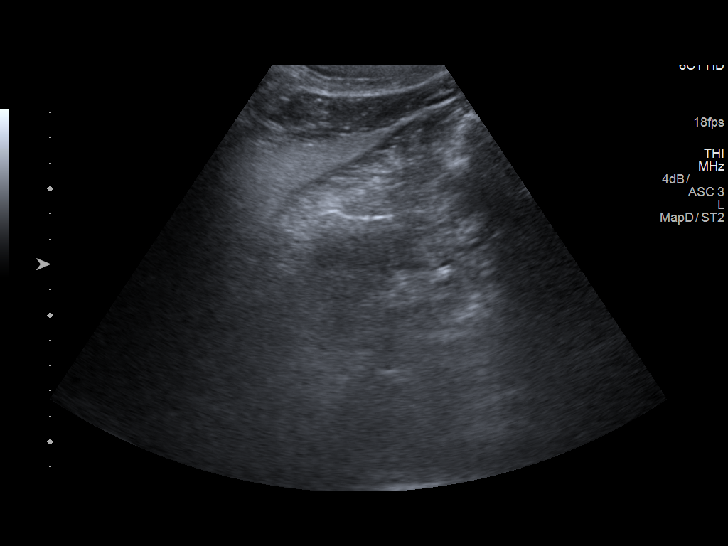
[im 7/79]
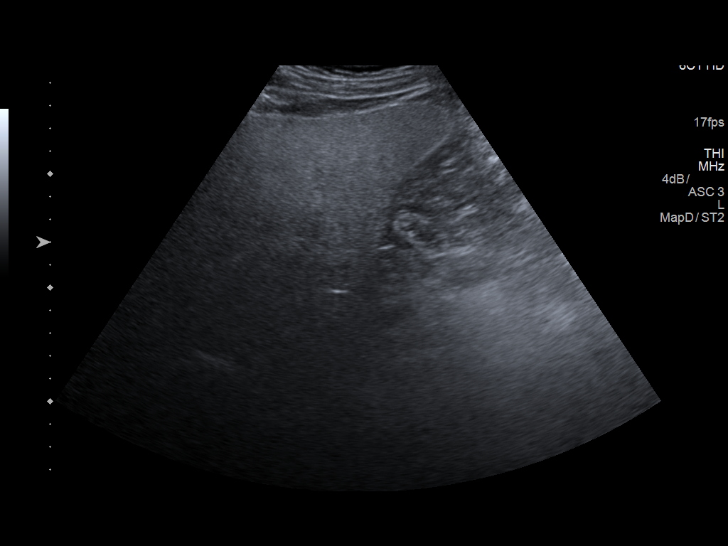
[im 14/79]
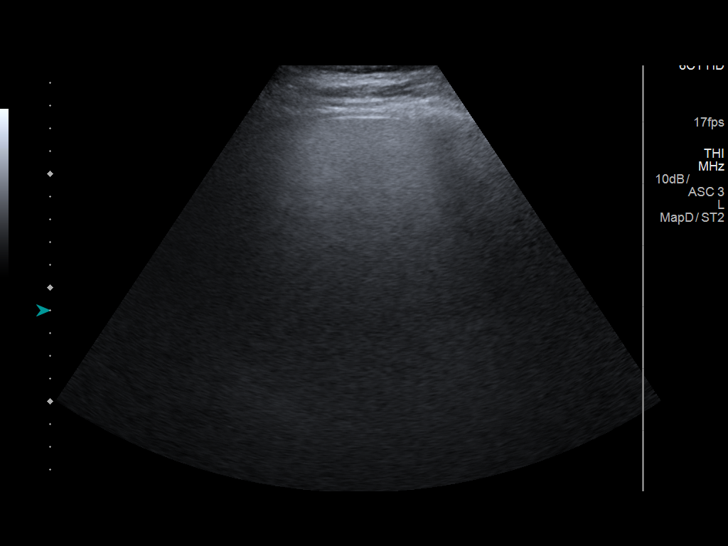
[im 20/79]
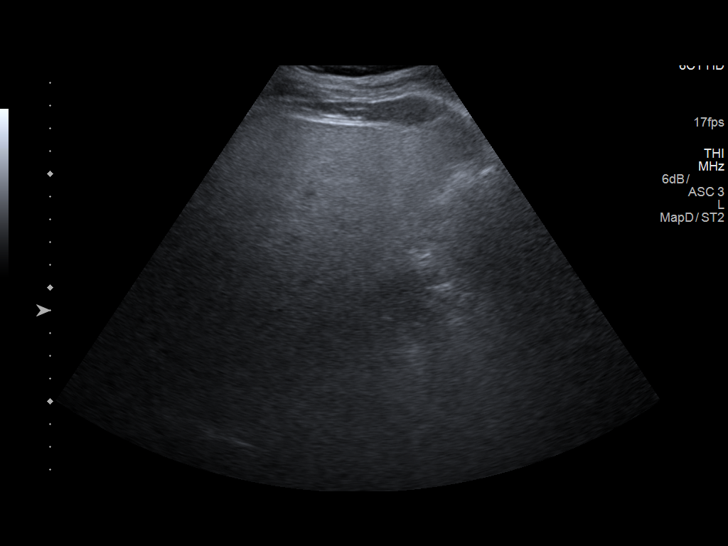
[im 27/79]
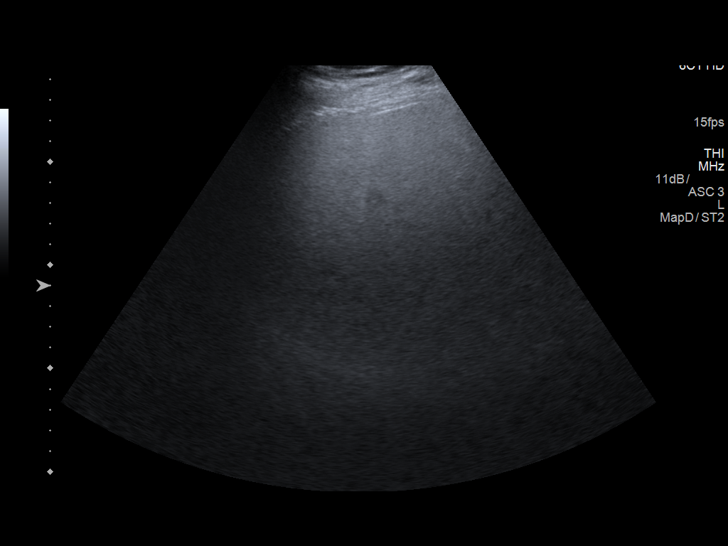
[im 33/79]
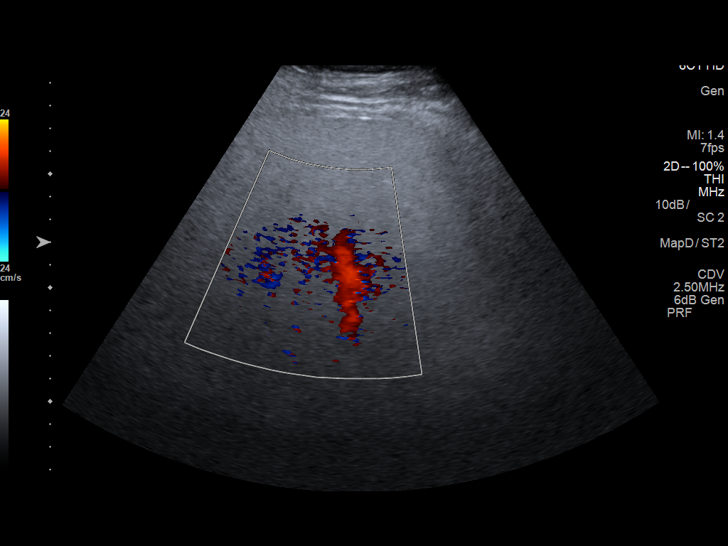
[im 40/79]
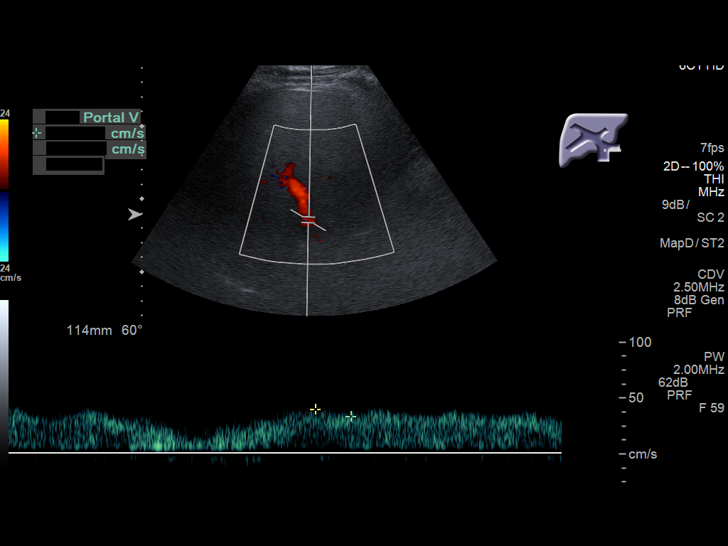
[im 46/79]
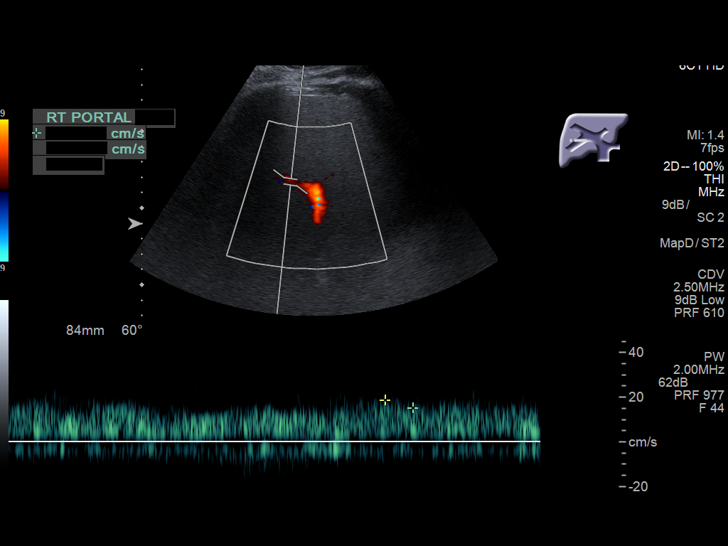
[im 53/79]
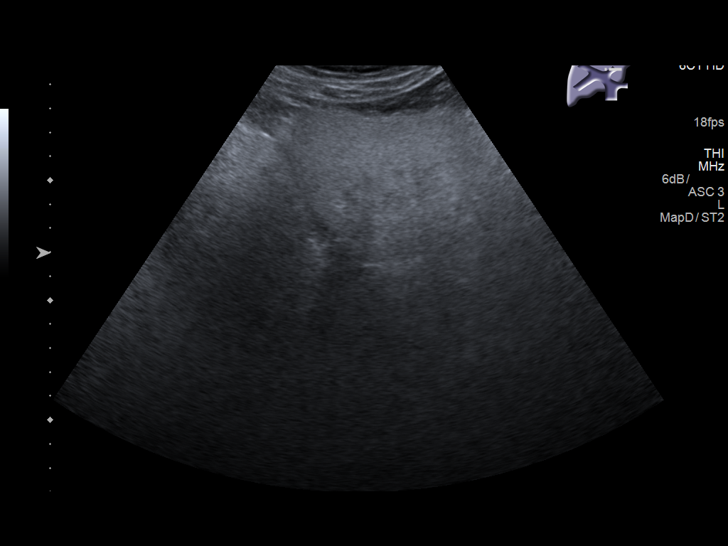
[im 59/79]
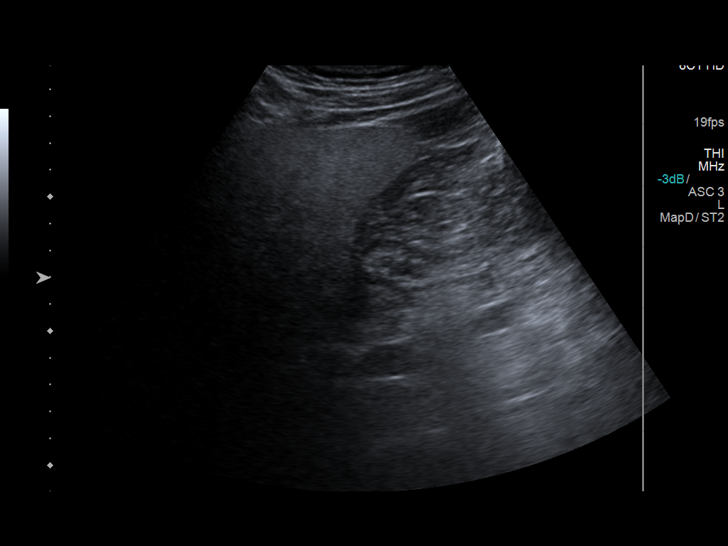
[im 66/79]
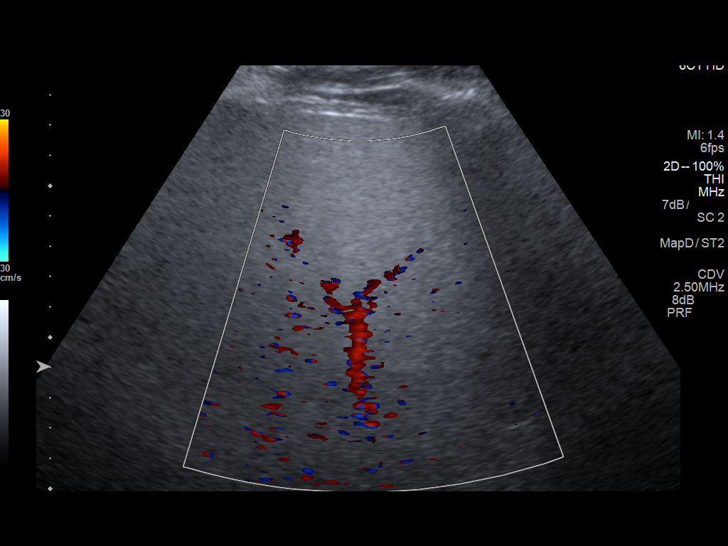
[im 72/79]
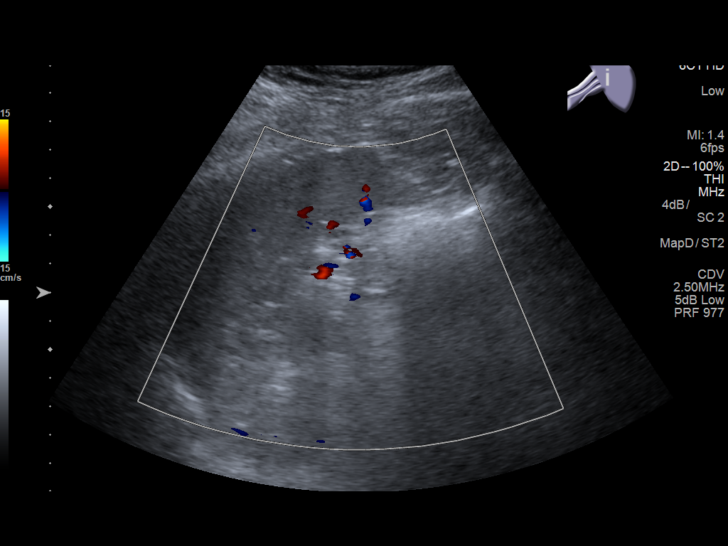
[im 79/79]
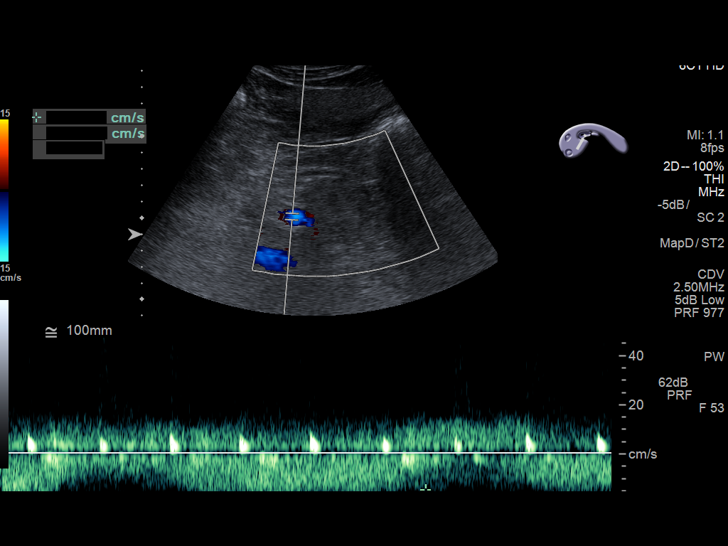

[13 of 25 positions shown; findings below may reference images not displayed]

FINDINGS: Portal Vein Velocities

Main: 40.1 cm/sec proximally ; 33.7 centimeters/second mid;
centimeter/second distally

Right:  18.8 cm/sec

Left:  19.1 cm/sec

Flow in each of these vessels is in the anatomic direction.

Hepatic Vein Velocities

Right:  5.2 cm/sec

Middle:  8.1 cm/sec

Left:  12.9 cm/sec

Flow in each of these vessels is in the anatomic direction.

Hepatic Artery Velocity:  61.9 cm/sec

Splenic Vein Velocity:  19.1 cm/sec

Varices: Absent

Ascites: Absent

Spleen is normal in size and contour. No splenic lesions are
evident. There is diffuse hepatic steatosis.

There is no evident portal vein occlusion or thrombus. There is no
splenic vein occlusion or thrombus.

Note that there is extensive overlying gas making evaluation
somewhat less than optimal.
IMPRESSION: Flow in the portal vein and its branches, hepatic vein and its
branches, and splenic vein is in respective anatomic directions. No
thrombus or occlusion noted in these vessels. No splenomegaly. No
varices or ascites evident. There is diffuse hepatic steatosis.
While no focal liver lesions are evident on this study, it must be
cautioned that the sensitivity of ultrasound for detection of focal
liver lesions is diminished significantly in this circumstance.

## 2019-07-05 ENCOUNTER — Ambulatory Visit (INDEPENDENT_AMBULATORY_CARE_PROVIDER_SITE_OTHER): Payer: Self-pay

## 2019-07-05 ENCOUNTER — Other Ambulatory Visit: Payer: Self-pay | Admitting: Family Medicine

## 2019-07-05 ENCOUNTER — Ambulatory Visit
Admission: RE | Admit: 2019-07-05 | Discharge: 2019-07-05 | Disposition: A | Discharge status: Home / Self Care | Payer: Worker's Compensation | Attending: Family Medicine | Admitting: Family Medicine

## 2019-07-05 ENCOUNTER — Other Ambulatory Visit: Payer: Self-pay

## 2019-07-05 ENCOUNTER — Ambulatory Visit
Admission: RE | Admit: 2019-07-05 | Discharge: 2019-07-05 | Disposition: A | Discharge status: Home / Self Care | Payer: Self-pay | Source: Ambulatory Visit | Attending: Family Medicine | Admitting: Family Medicine

## 2019-07-05 DIAGNOSIS — R609 Edema, unspecified: Secondary | ICD-10-CM | POA: Insufficient documentation

## 2019-07-05 DIAGNOSIS — R52 Pain, unspecified: Secondary | ICD-10-CM | POA: Insufficient documentation

## 2019-07-05 DIAGNOSIS — Z23 Encounter for immunization: Secondary | ICD-10-CM

## 2019-07-20 ENCOUNTER — Other Ambulatory Visit: Payer: Self-pay | Admitting: Internal Medicine

## 2019-07-28 ENCOUNTER — Other Ambulatory Visit: Payer: Self-pay

## 2019-07-28 ENCOUNTER — Emergency Department: Payer: BC Managed Care – PPO

## 2019-07-28 ENCOUNTER — Emergency Department
Admission: EM | Admit: 2019-07-28 | Discharge: 2019-07-28 | Disposition: A | Discharge status: Home / Self Care | Payer: BC Managed Care – PPO | Attending: Emergency Medicine | Admitting: Emergency Medicine

## 2019-07-28 ENCOUNTER — Encounter: Payer: Self-pay | Admitting: Emergency Medicine

## 2019-07-28 DIAGNOSIS — I10 Essential (primary) hypertension: Secondary | ICD-10-CM | POA: Insufficient documentation

## 2019-07-28 DIAGNOSIS — R079 Chest pain, unspecified: Secondary | ICD-10-CM | POA: Diagnosis not present

## 2019-07-28 DIAGNOSIS — R0789 Other chest pain: Secondary | ICD-10-CM | POA: Insufficient documentation

## 2019-07-28 LAB — BASIC METABOLIC PANEL
Anion gap: 6 (ref 5–15)
BUN: 20 mg/dL (ref 6–20)
CO2: 26 mmol/L (ref 22–32)
Calcium: 9.2 mg/dL (ref 8.9–10.3)
Chloride: 106 mmol/L (ref 98–111)
Creatinine, Ser: 1.17 mg/dL (ref 0.61–1.24)
GFR calc Af Amer: >60 mL/min (ref >60)
GFR calc non Af Amer: >60 mL/min (ref >60)
Glucose, Bld: 158 mg/dL — ABNORMAL HIGH (ref 70–99)
Potassium: 3.7 mmol/L (ref 3.5–5.1)
Sodium: 138 mmol/L (ref 135–145)

## 2019-07-28 LAB — CBC
HCT: 40.7 % (ref 39.0–52.0)
Hemoglobin: 14.2 g/dL (ref 13.0–17.0)
MCH: 30.1 pg (ref 26.0–34.0)
MCHC: 34.9 g/dL (ref 30.0–36.0)
MCV: 86.2 fL (ref 80.0–100.0)
Platelets: 225 10*3/uL (ref 150–400)
RBC: 4.72 MIL/uL (ref 4.22–5.81)
RDW: 13.2 % (ref 11.5–15.5)
WBC: 5.7 10*3/uL (ref 4.0–10.5)
nRBC: 0 % (ref 0.0–0.2)

## 2019-07-28 LAB — TROPONIN I (HIGH SENSITIVITY)
Troponin I (High Sensitivity): <2 ng/L (ref <18)
Troponin I (High Sensitivity): <2 ng/L (ref <18)

## 2019-07-28 MED ORDER — SODIUM CHLORIDE 0.9% FLUSH: 3.0000 mL | Freq: Once | INTRAVENOUS | Status: DC

## 2019-07-28 NOTE — ED Triage Notes (Signed)
L chest pain x 2 days. Increasing. Denies SOB. Denies fevers or cough.

## 2019-07-28 NOTE — ED Notes (Signed)
Pt transported to XR.  

## 2019-07-28 NOTE — ED Provider Notes (Signed)
[facility] Emergency Department Provider Note       Time seen: ----------------------------------------- 5:13 PM on 07/28/2019 -----------------------------------------   I have reviewed the triage vital signs and the nursing notes.  HISTORY   Chief Complaint Chest Pain    HPI Christopher Vazquez is a 53 y.o. male with a history of GERD, hyperlipidemia, hypertension who presents to the ED for left-sided chest pain for 2 days that is increasing.  He denies any shortness of breath, denies fever or cough.  Discomfort is 4 out of 10.  Past Medical History:  Diagnosis Date  . Complication of anesthesia    with tonsillectomy sever swelling of throat, possible laryngiospasm.  Marland Kitchen GERD (gastroesophageal reflux disease)   . Hyperlipidemia   . Hypertension     Patient Active Problem List   Diagnosis Date Noted  . Special screening for malignant neoplasms, colon   . Encounter for preventive health examination 10/23/2017  . Vasomotor flushing 02/10/2016  . Musculoskeletal pain 02/04/2016  . Prediabetes 08/18/2015  . Obesity 02/09/2015  . Vitamin D deficiency 02/19/2014  . Seasonal allergic rhinitis 02/02/2014  . Dysphagia, unspecified(787.20) 03/07/2013  . Hepatic steatosis 03/07/2013  . Polyarthritis 01/07/2012  . Fatigue 11/14/2011  . Hyperlipidemia   . Essential hypertension     Past Surgical History:  Procedure Laterality Date  . CHOLESTEATOMA EXCISION Left 2000   left ear  . COLONOSCOPY WITH PROPOFOL N/A 11/17/2017   Procedure: COLONOSCOPY WITH PROPOFOL;  Surgeon: Lin Landsman, MD;  Location: [facility] ENDOSCOPY;  Service: Gastroenterology;  Laterality: N/A;  . HERNIA REPAIR    . KNEE ARTHROSCOPY WITH MEDIAL MENISECTOMY Left 07/01/2016   Procedure: left knee arthroscopy with partial medial menisectomy;  Surgeon: Hessie Knows, MD;  Location: [facility] ORS;  Service: Orthopedics;  Laterality: Left;  . TONSILLECTOMY AND ADENOIDECTOMY       Allergies Scopolamine, Shellfish allergy, and Niacin and related  Social History Social History   Tobacco Use  . Smoking status: Never Smoker  . Smokeless tobacco: Never Used  Substance Use Topics  . Alcohol use: Yes    Comment: 1-2 drinks every 3-4 months  . Drug use: No   Review of Systems Constitutional: Negative for fever. Cardiovascular: Positive for chest pain Respiratory: Negative for shortness of breath. Gastrointestinal: Negative for abdominal pain, vomiting and diarrhea. Musculoskeletal: Negative for back pain. Skin: Negative for rash. Neurological: Negative for headaches, focal weakness or numbness.  All systems negative/normal/unremarkable except as stated in the HPI  ____________________________________________   PHYSICAL EXAM:  VITAL SIGNS: ED Triage Vitals  Enc Vitals Group     BP 07/28/19 1601 135/78     Pulse Rate 07/28/19 1601 73     Resp 07/28/19 1601 20     Temp 07/28/19 1601 98.3 F (36.8 C)     Temp Source 07/28/19 1601 Oral     SpO2 07/28/19 1601 95 %     Weight 07/28/19 1602 228 lb (103.4 kg)     Height 07/28/19 1602 5' 6.5" (1.689 m)     Head Circumference --      Peak Flow --      Pain Score 07/28/19 1602 4     Pain Loc --      Pain Edu? --      Excl. in [place]? --    Constitutional: Alert and oriented. Well appearing and in no distress. Eyes: Conjunctivae are normal. Normal extraocular movements. ENT      Head: Normocephalic and atraumatic.  Nose: No congestion/rhinnorhea.      Mouth/Throat: Mucous membranes are moist.      Neck: No stridor. Cardiovascular: Normal rate, regular rhythm. No murmurs, rubs, or gallops. Respiratory: Normal respiratory effort without tachypnea nor retractions. Breath sounds are clear and equal bilaterally. No wheezes/rales/rhonchi. Gastrointestinal: Soft and nontender. Normal bowel sounds Musculoskeletal: Nontender with normal range of motion in extremities. No lower extremity tenderness nor  edema. Neurologic:  Normal speech and language. No gross focal neurologic deficits are appreciated.  Skin:  Skin is warm, dry and intact. No rash noted. Psychiatric: Mood and affect are normal. Speech and behavior are normal.  ____________________________________________  EKG: Interpreted by me.  Sinus rhythm with rate of 80 bpm, normal PR interval, normal QRS, normal QT  ____________________________________________  ED COURSE:  As part of my medical decision making, I reviewed the following data within the [place] History obtained from family if available, nursing notes, old chart and ekg, as well as notes from prior ED visits. Patient presented for chest pain, we will assess with labs and imaging as indicated at this time.   Procedures  TUSHAR DENNEE was evaluated in Emergency Department on 07/28/2019 for the symptoms described in the history of present illness. He was evaluated in the context of the global COVID-19 pandemic, which necessitated consideration that the patient might be at risk for infection with the SARS-CoV-2 virus that causes COVID-19. Institutional protocols and algorithms that pertain to the evaluation of patients at risk for COVID-19 are in a state of rapid change based on information released by regulatory bodies including the CDC and federal and state organizations. These policies and algorithms were followed during the patient's care in the ED.  ____________________________________________   LABS (pertinent positives/negatives)  Labs Reviewed  BASIC METABOLIC PANEL - Abnormal; Notable for the following components:      Result Value   Glucose, Bld 158 (*)    All other components within normal limits  CBC  TROPONIN I (HIGH SENSITIVITY)    RADIOLOGY  Chest x-ray IMPRESSION: Normal chest x-ray. No evidence of pneumonia or pulmonary edema.  ____________________________________________   DIFFERENTIAL DIAGNOSIS   Musculoskeletal pain,  GERD, anxiety, chest wall pain, MI  FINAL ASSESSMENT AND PLAN  Nonspecific chest pain   Plan: The patient had presented for nonspecific chest pain. Patient's labs were reassuring. Patient's imaging did not reveal any acute process.  He is cleared for outpatient follow-up.   Laurence Aly, MD    Note: This note was generated in part or whole with voice recognition software. Voice recognition is usually quite accurate but there are transcription errors that can and very often do occur. I apologize for any typographical errors that were not detected and corrected.     Earleen Newport, MD 07/28/19 [id number]

## 2019-08-01 DIAGNOSIS — K429 Umbilical hernia without obstruction or gangrene: Secondary | ICD-10-CM | POA: Diagnosis not present

## 2019-08-06 ENCOUNTER — Encounter: Payer: Self-pay | Admitting: Cardiology

## 2019-08-06 ENCOUNTER — Ambulatory Visit (INDEPENDENT_AMBULATORY_CARE_PROVIDER_SITE_OTHER): Payer: BC Managed Care – PPO | Admitting: Cardiology

## 2019-08-06 ENCOUNTER — Other Ambulatory Visit: Payer: Self-pay

## 2019-08-06 VITALS — BP 124/74 | HR 74 | Ht 66.5 in | Wt 229.0 lb

## 2019-08-06 DIAGNOSIS — E669 Obesity, unspecified: Secondary | ICD-10-CM

## 2019-08-06 DIAGNOSIS — I77811 Abdominal aortic ectasia: Secondary | ICD-10-CM | POA: Diagnosis not present

## 2019-08-06 DIAGNOSIS — I10 Essential (primary) hypertension: Secondary | ICD-10-CM | POA: Diagnosis not present

## 2019-08-06 DIAGNOSIS — R079 Chest pain, unspecified: Secondary | ICD-10-CM | POA: Diagnosis not present

## 2019-08-06 NOTE — Patient Instructions (Signed)
Medication Instructions:  Your physician recommends that you continue on your current medications as directed. Please refer to the Current Medication list given to you today.  If you need a refill on your cardiac medications before your next appointment, please call your pharmacy.   Lab work: None ordered If you have labs (blood work) drawn today and your tests are completely normal, you will receive your results only by: Marland Kitchen MyChart Message (if you have MyChart) OR . A paper copy in the mail If you have any lab test that is abnormal or we need to change your treatment, we will call you to review the results.  Testing/Procedures: Your physician has requested that you have an echocardiogram. Echocardiography is a painless test that uses sound waves to create images of your heart. It provides your doctor with information about the size and shape of your heart and how well your heart's chambers and valves are working. This procedure takes approximately one hour. There are no restrictions for this procedure.    Follow-Up: At [facility], you and your health needs are our priority.  As part of our continuing mission to provide you with exceptional heart care, we have created designated Provider Care Teams.  These Care Teams include your primary Cardiologist (physician) and Advanced Practice Providers (APPs -  Physician Assistants and Nurse Practitioners) who all work together to provide you with the care you need, when you need it. You will need a follow up appointment in 6 weeks.   You may see Kate Sable, MD or one of the following Advanced Practice Providers on your designated Care Team:   Murray Hodgkins, NP Christell Faith, PA-C . Marrianne Mood, PA-C  Any Other Special Instructions Will Be Listed Below (If Applicable). N/A

## 2019-08-06 NOTE — Progress Notes (Signed)
Cardiology Office Note:    Date:  08/06/2019   ID:  Christopher Vazquez, DOB 03-09-1966, MRN [id number]  PCP:  Crecencio Mc, MD  Cardiologist:  Kate Sable, MD  Electrophysiologist:  None   Referring MD: Crecencio Mc, MD   Chief Complaint  Patient presents with  . other    F/u ED chest pain no complaints today. Meds reviewed verbally with pt.    History of Present Illness:    Christopher Vazquez is a 53 y.o. male with a hx of hypertension, hyperlipidemia, who presents as a follow-up from the emergency room due to tightness.  About 10 days ago, patient was walking with his wife when he suddenly states not feeling well and felt dizzy.  He then went home, checked his blood pressure which was elevated with [id number] systolics and [id number] diastolic.  He try to relax, and take deep breaths which improved his systolics to 0000000, but diastolics were still elevated at about 110.  Patient then drove himself to the emergency room, upon arrival and blood pressure check, it was within normal limits.  Evaluation in the ED was unrevealing.  Patient was then discharged home.  He states having occasional chest tightness not related with exertion.  He denies dyspnea on exertion, edema, orthopnea, palpitations.  He mentions having an ectatic abdominal aorta.  His father has a history of abdominal aortic aneurysm and also thoracic aortic aneurysm.  His dad was a smoker and had an MI at age 54.  His thoracic aorta has never been imaged.  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  Past Medical History:  Diagnosis Date  . Complication of anesthesia    with tonsillectomy sever swelling of throat, possible laryngiospasm.  Marland Kitchen GERD (gastroesophageal reflux disease)   . Hyperlipidemia   . Hypertension     Past Surgical History:  Procedure Laterality Date  . CHOLESTEATOMA EXCISION Left 2000   left ear  . COLONOSCOPY WITH  PROPOFOL N/A 11/17/2017   Procedure: COLONOSCOPY WITH PROPOFOL;  Surgeon: Lin Landsman, MD;  [facility] ENDOSCOPY;  Service: Gastroenterology;  Laterality: N/A;  . HERNIA REPAIR    . KNEE ARTHROSCOPY WITH MEDIAL MENISECTOMY Left 07/01/2016   Procedure: left knee arthroscopy with partial medial menisectomy;  Surgeon: Hessie Knows, MD;  Location: [facility] ORS;  Service: Orthopedics;  Laterality: Left;  . TONSILLECTOMY AND ADENOIDECTOMY      Current Medications: Current Meds  Medication Sig  . aspirin 81 MG tablet Take 81 mg by mouth at bedtime.   . fenofibrate 160 MG tablet TAKE 1 TABLET BY MOUTH EVERY DAY  . montelukast (SINGULAIR) 10 MG tablet TAKE 1 TABLET BY MOUTH EVERYDAY AT BEDTIME  . pantoprazole (PROTONIX) 40 MG tablet Take 1 tablet by mouth once daily  . sildenafil (REVATIO) 20 MG tablet TAKE 1 TABLET BY MOUTH THREE TIMES DAILY  . simvastatin (ZOCOR) 20 MG tablet TAKE 1 TABLET BY MOUTH EVERYDAY AT BEDTIME  . telmisartan-hydrochlorothiazide (MICARDIS HCT) 80-25 MG tablet Take 1 tablet by mouth daily.  . traZODone (DESYREL) 100 MG tablet Take 1 tablet (100 mg total) by mouth at bedtime as needed for sleep.     Allergies:   Scopolamine, Shellfish allergy, and Niacin and related   Social History   Socioeconomic History  . Marital status: Married    Spouse name: Not on file  . Number of children: 3  .  Years of education: Not on file  . Highest education level: Not on file  Occupational History  . Occupation: Buyer, retail: [employer]  . Financial resource strain: Not on file  . Food insecurity    Worry: Not on file    Inability: Not on file  . Transportation needs    Medical: Not on file    Non-medical: Not on file  Tobacco Use  . Smoking status: Never Smoker  . Smokeless tobacco: Never Used  Substance and Sexual Activity  . Alcohol use: Yes    Comment: 1-2 drinks every 3-4 months  . Drug use: No  . Sexual activity: Not on file   Lifestyle  . Physical activity    Days per week: Not on file    Minutes per session: Not on file  . Stress: Not on file  Relationships  . Social Herbalist on phone: Not on file    Gets together: Not on file    Attends religious service: Not on file    Active member of club or organization: Not on file    Attends meetings of clubs or organizations: Not on file    Relationship status: Not on file  Other Topics Concern  . Not on file  Social History Narrative  . Not on file     Family History: The patient's family history includes AAA (abdominal aortic aneurysm) in his father; Aortic aneurysm (age of onset: 35) in his father; Diabetes in his mother; Heart disease (age of onset: 19) in his father; Hypertension in his mother; Uterine cancer in his mother.  ROS:   Please see the history of present illness.     All other systems reviewed and are negative.  EKGs/Labs/Other Studies Reviewed:    The following studies were reviewed today:   EKG:  EKG is  ordered today.  The ekg ordered today demonstrates normal sinus rhythm, normal ECG.  Recent Labs: 12/08/2018: ALT 36 07/28/2019: BUN 20; Creatinine, Ser 1.17; Hemoglobin 14.2; Platelets 225; Potassium 3.7; Sodium 138  Recent Lipid Panel    Component Value Date/Time   CHOL 117 12/08/2018 1556   TRIG 219 (H) 12/08/2018 1556   HDL 25 (L) 12/08/2018 1556   CHOLHDL 4.7 12/08/2018 1556   LDLCALC 63 12/08/2018 1556    Physical Exam:    VS:  BP 124/74 (BP Location: Right Arm, Patient Position: Sitting, Cuff Size: Normal)   Pulse 74   Ht 5' 6.5" (1.689 m)   Wt 229 lb (103.9 kg)   SpO2 99%   BMI 36.41 kg/m     Wt Readings from Last 3 Encounters:  08/06/19 229 lb (103.9 kg)  07/28/19 228 lb (103.4 kg)  01/07/19 222 lb (100.7 kg)     GEN:  Well nourished, well developed in no acute distress HEENT: Normal NECK: No JVD; No carotid bruits LYMPHATICS: No lymphadenopathy CARDIAC: RRR, no murmurs, rubs, gallops  RESPIRATORY:  Clear to auscultation without rales, wheezing or rhonchi  ABDOMEN: Soft, non-tender, non-distended MUSCULOSKELETAL:  No edema; No deformity  SKIN: Warm and dry NEUROLOGIC:  Alert and oriented x 3 PSYCHIATRIC:  Normal affect   ASSESSMENT:   Patient's chest tightness appears to be atypical.  Likely secondary to elevated blood pressures at the time. 1. Chest pain, unspecified type   2. 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 Essential hypertension   4. Obesity (BMI 35.0-39.9 without comorbidity)    PLAN:    In  order of problems listed above:  1. Chest pain atypical.  Continue aggressive management of risk factors including hypertension, hyperlipidemia, obesity.  Currently takes aspirin and simvastatin advised to continue. 2. Obtain echocardiogram.  Patient has history of ectatic abdominal aorta.  Also family history of thoracic aortic aneurysm. 3. Continue current blood pressure meds.  Blood pressure well controlled.  Patient advised on keeping stress low. 4. Weight loss advised.  Follow-up after echo. Medication Adjustments/Labs and Tests Ordered: Current medicines are reviewed at length with the patient today.  Concerns regarding medicines are outlined above.  Orders Placed This Encounter  Procedures  . EKG 12-Lead  . ECHOCARDIOGRAM COMPLETE   No orders of the defined types were placed in this encounter.   Patient Instructions  Medication Instructions:  Your physician recommends that you continue on your current medications as directed. Please refer to the Current Medication list given to you today.  If you need a refill on your cardiac medications before your next appointment, please call your pharmacy.   Lab work: None ordered If you have labs (blood work) drawn today and your tests are completely normal, you will receive your results only by: Marland Kitchen MyChart Message (if you have MyChart) OR . A paper copy in the mail If you have any lab test that is abnormal or we  need to change your treatment, we will call you to review the results.  Testing/Procedures: Your physician has requested that you have an echocardiogram. Echocardiography is a painless test that uses sound waves to create images of your heart. It provides your doctor with information about the size and shape of your heart and how well your heart's chambers and valves are working. This procedure takes approximately one hour. There are no restrictions for this procedure.    Follow-Up: At [facility], you and your health needs are our priority.  As part of our continuing mission to provide you with exceptional heart care, we have created designated Provider Care Teams.  These Care Teams include your primary Cardiologist (physician) and Advanced Practice Providers (APPs -  Physician Assistants and Nurse Practitioners) who all work together to provide you with the care you need, when you need it. You will need a follow up appointment in 6 weeks.   You may see Kate Sable, MD or one of the following Advanced Practice Providers on your designated Care Team:   Murray Hodgkins, NP Christell Faith, PA-C . Marrianne Mood, PA-C  Any Other Special Instructions Will Be Listed Below (If Applicable). N/A      Signed, Kate Sable, MD  08/06/2019 2:25 PM    [facility]

## 2019-08-14 NOTE — Patient Instructions (Addendum)
DUE TO COVID-19 ONLY ONE VISITOR IS ALLOWED TO COME WITH YOU AND STAY IN THE WAITING ROOM ONLY DURING PRE OP AND PROCEDURE DAY OF SURGERY. THE 1 VISITOR MAY VISIT WITH YOU AFTER SURGERY IN YOUR PRIVATE ROOM DURING VISITING HOURS ONLY!  YOU NEED TO HAVE A COVID 19 TEST ON_Thursday_10/08/2020______ @__250  pm___, THIS TEST MUST BE DONE BEFORE SURGERY, COME  [address], [address] , [address].  ([employer]) ONCE YOUR COVID TEST IS COMPLETED, PLEASE BEGIN THE QUARANTINE INSTRUCTIONS AS OUTLINED IN YOUR HANDOUT.                Christopher Vazquez    Your procedure is scheduled on: Monday 08/20/2019   Report to [facility] Main  Entrance    Report to admitting at 09:30am     Call this number if you have problems the morning of surgery [phone]    Remember: Do not eat food or drink liquids :After Midnight.   BRUSH YOUR TEETH MORNING OF SURGERY AND RINSE YOUR MOUTH OUT, NO CHEWING GUM CANDY OR MINTS.     Take these medicines the morning of surgery with A SIP OF WATER: Protonix                                 You may not have any metal on your body including hair pins and              piercings  Do not wear jewelry, make-up, lotions, powders or perfumes, deodorant                         Men may shave face and neck.   Do not bring valuables to the hospital. [place].  Contacts, dentures or bridgework may not be worn into surgery.  Leave suitcase in the car. After surgery it may be brought to your room.     Patients discharged the day of surgery will not be allowed to drive home. IF YOU ARE HAVING SURGERY AND GOING HOME THE SAME DAY, YOU MUST HAVE AN ADULT TO DRIVE YOU HOME AND BE WITH YOU FOR 24 HOURS. YOU MAY GO HOME BY TAXI OR UBER OR ORTHERWISE, BUT AN ADULT MUST ACCOMPANY YOU HOME AND STAY WITH YOU FOR 24 HOURS.  Name and phone number of your driver: spouse- Christopher [phone]     Please read over the following fact sheets you were given: _____________________________________________________________________             [facility] - Preparing for Surgery Before surgery, you can play an important role.  Because skin is not sterile, your skin needs to be as free of germs as possible.  You can reduce the number of germs on your skin by washing with CHG (chlorahexidine gluconate) soap before surgery.  CHG is an antiseptic cleaner which kills germs and bonds with the skin to continue killing germs even after washing. Please DO NOT use if you have an allergy to CHG or antibacterial soaps.  If your skin becomes reddened/irritated stop using the CHG and inform your nurse when you arrive at Short Stay. Do not shave (including legs and underarms) for at least 48 hours prior to the first CHG shower.  You may shave your face/neck. Please follow these instructions  carefully:  1.  Shower with CHG Soap the night before surgery and the  morning of Surgery.  2.  If you choose to wash your hair, wash your hair first as usual with your  normal  shampoo.  3.  After you shampoo, rinse your hair and body thoroughly to remove the  shampoo.                           4.  Use CHG as you would any other liquid soap.  You can apply chg directly  to the skin and wash                       Gently with a scrungie or clean washcloth.  5.  Apply the CHG Soap to your body ONLY FROM THE NECK DOWN.   Do not use on face/ open                           Wound or open sores. Avoid contact with eyes, ears mouth and genitals (private parts).                       Wash face,  Genitals (private parts) with your normal soap.             6.  Wash thoroughly, paying special attention to the area where your surgery  will be performed.  7.  Thoroughly rinse your body with warm water from the neck down.  8.  DO NOT shower/wash with your normal soap after using and rinsing off  the CHG Soap.                9.  Pat  yourself dry with a clean towel.            10.  Wear clean pajamas.            11.  Place clean sheets on your bed the night of your first shower and do not  sleep with pets. Day of Surgery : Do not apply any lotions/deodorants the morning of surgery.  Please wear clean clothes to the hospital/surgery center.  FAILURE TO FOLLOW THESE INSTRUCTIONS MAY RESULT IN THE CANCELLATION OF YOUR SURGERY PATIENT SIGNATURE_________________________________  NURSE SIGNATURE__________________________________  ________________________________________________________________________

## 2019-08-16 ENCOUNTER — Encounter (HOSPITAL_COMMUNITY)
Admission: RE | Admit: 2019-08-16 | Discharge: 2019-08-16 | Disposition: A | Discharge status: Home / Self Care | Payer: BC Managed Care – PPO | Source: Ambulatory Visit | Attending: Surgery | Admitting: Surgery

## 2019-08-16 ENCOUNTER — Other Ambulatory Visit (HOSPITAL_COMMUNITY)
Admission: RE | Admit: 2019-08-16 | Discharge: 2019-08-16 | Disposition: A | Discharge status: Home / Self Care | Payer: BC Managed Care – PPO | Source: Ambulatory Visit | Attending: Surgery | Admitting: Surgery

## 2019-08-16 ENCOUNTER — Other Ambulatory Visit: Payer: Self-pay

## 2019-08-16 ENCOUNTER — Encounter (HOSPITAL_COMMUNITY): Payer: Self-pay

## 2019-08-16 DIAGNOSIS — Z7982 Long term (current) use of aspirin: Secondary | ICD-10-CM | POA: Diagnosis not present

## 2019-08-16 DIAGNOSIS — Z01812 Encounter for preprocedural laboratory examination: Secondary | ICD-10-CM | POA: Diagnosis not present

## 2019-08-16 DIAGNOSIS — Z79899 Other long term (current) drug therapy: Secondary | ICD-10-CM | POA: Diagnosis not present

## 2019-08-16 DIAGNOSIS — E785 Hyperlipidemia, unspecified: Secondary | ICD-10-CM | POA: Diagnosis not present

## 2019-08-16 DIAGNOSIS — K429 Umbilical hernia without obstruction or gangrene: Secondary | ICD-10-CM | POA: Insufficient documentation

## 2019-08-16 DIAGNOSIS — K219 Gastro-esophageal reflux disease without esophagitis: Secondary | ICD-10-CM | POA: Diagnosis not present

## 2019-08-16 DIAGNOSIS — I10 Essential (primary) hypertension: Secondary | ICD-10-CM | POA: Diagnosis not present

## 2019-08-16 DIAGNOSIS — Z20828 Contact with and (suspected) exposure to other viral communicable diseases: Secondary | ICD-10-CM | POA: Insufficient documentation

## 2019-08-16 LAB — CBC
HCT: 41.7 % (ref 39.0–52.0)
Hemoglobin: 14 g/dL (ref 13.0–17.0)
MCH: 30.1 pg (ref 26.0–34.0)
MCHC: 33.6 g/dL (ref 30.0–36.0)
MCV: 89.7 fL (ref 80.0–100.0)
Platelets: 224 10*3/uL (ref 150–400)
RBC: 4.65 MIL/uL (ref 4.22–5.81)
RDW: 13.7 % (ref 11.5–15.5)
WBC: 4.5 10*3/uL (ref 4.0–10.5)
nRBC: 0 % (ref 0.0–0.2)

## 2019-08-16 LAB — BASIC METABOLIC PANEL
Anion gap: 9 (ref 5–15)
BUN: 17 mg/dL (ref 6–20)
CO2: 26 mmol/L (ref 22–32)
Calcium: 9.1 mg/dL (ref 8.9–10.3)
Chloride: 105 mmol/L (ref 98–111)
Creatinine, Ser: 1.26 mg/dL — ABNORMAL HIGH (ref 0.61–1.24)
GFR calc Af Amer: >60 mL/min (ref >60)
GFR calc non Af Amer: >60 mL/min (ref >60)
Glucose, Bld: 131 mg/dL — ABNORMAL HIGH (ref 70–99)
Potassium: 4.1 mmol/L (ref 3.5–5.1)
Sodium: 140 mmol/L (ref 135–145)

## 2019-08-16 NOTE — Progress Notes (Signed)
   08/16/19 0825  OBSTRUCTIVE SLEEP APNEA  Have you ever been diagnosed with sleep apnea through a sleep study? No  Do you snore loudly (loud enough to be heard through closed doors)?  0  Do you often feel tired, fatigued, or sleepy during the daytime (such as falling asleep during driving or talking to someone)? 0  Has anyone observed you stop breathing during your sleep? 0  Do you have, or are you being treated for high blood pressure? 1  BMI more than 35 kg/m2? 1  Age > 50 (1-yes) 1  Neck circumference greater than:Male 16 inches or larger, Male 17inches or larger? 1  Male Gender (Yes=1) 1  Obstructive Sleep Apnea Score 5

## 2019-08-16 NOTE — Progress Notes (Signed)
PCP - Dr. Deborra Medina Cardiologist - Dr. Aaron Edelman Agbor-Etang   LOV-08/06/2019  Chest x-ray - 07/28/2019 , 12/08/2018 EKG - 08/06/2019 Stress Test -  Many years ago ECHO - scheduled for 09/14/2019 Cardiac Cath -None   Sleep Study - none CPAP - none  Fasting Blood Sugar -none  Checks Blood Sugar _____ times a day  Blood Thinner Instructions:none Aspirin Instructions:Patient states that  He started taking Aspirin 81 mg daily as a preventative at age 28  due to family History of HTN and father  had an MI an early age Last Dose:08/13/2019  Anesthesia review:   Patient denies shortness of breath, fever, cough and chest pain at PAT appointment   Patient verbalized understanding of instructions that were given to them at the PAT appointment. Patient was also instructed that they will need to review over the PAT instructions again at home before surgery.

## 2019-08-17 LAB — NOVEL CORONAVIRUS, NAA (HOSP ORDER, SEND-OUT TO REF LAB; TAT 18-24 HRS): SARS-CoV-2, NAA: NOT DETECTED

## 2019-08-17 NOTE — Progress Notes (Signed)
Anesthesia Chart Review   Case: [id number] Date/Time: 08/20/19 1115   Procedure: LAPAROSCOPIC ASSISTED UMBILICAL HERNIA REPAIR (N/A )   Anesthesia type: General   Pre-op diagnosis: PAINFUL UMBILICAL HERNIA   Location: [place] [facility] / [facility] ORS   Surgeon: Johnathan Hausen, MD      DISCUSSION:53 y.o. never smoker with h/o HLD, HTN, GERD, painful umbilical hernia scheduled for above procedure 08/20/2019 with Dr. Johnathan Hausen.   Seen in ED for chest pain, cardiac etiology ruled out.  Followed up with cardiologist, Dr. Kate Sable 08/06/2019.  Per OV note atypical chest pain, no further ischemic workup planned.  Echo ordered due to family history of thoracic AA and pt h/o ectatic abdominal aorta.  On Aorta Duplex exam 11/25/11 no evidence of abdominal aortic aneurysm.   Discussed with Dr. Therisa Doyne.  Anticipate pt can proceed with planned procedure barring acute status change.   VS: BP 124/75   Pulse 64   Temp 36.7 C (Oral)   Resp 16   Ht 5\' 7"  (1.702 m)   Wt 103.4 kg   SpO2 98%   BMI 35.71 kg/m   PROVIDERS: Crecencio Mc, MD last seen 12/08/2018  Kate Sable, MD is Cardiologist  LABS: Labs reviewed: Acceptable for surgery. (all labs ordered are listed, but only abnormal results are displayed)  Labs Reviewed  BASIC METABOLIC PANEL - Abnormal; Notable for the following components:      Result Value   Glucose, Bld 131 (*)    Creatinine, Ser 1.26 (*)    All other components within normal limits  CBC     IMAGES:   EKG: 08/06/2019 Rate 74 bpm Normal sinus rhythm   CV:  Past Medical History:  Diagnosis Date  . Complication of anesthesia    with tonsillectomy sever swelling of throat, possible laryngiospasm.  Marland Kitchen GERD (gastroesophageal reflux disease)   . Hyperlipidemia   . Hypertension     Past Surgical History:  Procedure Laterality Date  . CHOLESTEATOMA EXCISION Left 2000   left ear  . COLONOSCOPY WITH PROPOFOL N/A 11/17/2017   Procedure: COLONOSCOPY  WITH PROPOFOL;  Surgeon: Lin Landsman, MD;  Location: [facility] ENDOSCOPY;  Service: Gastroenterology;  Laterality: N/A;  . HERNIA REPAIR    . KNEE ARTHROSCOPY WITH MEDIAL MENISECTOMY Left 07/01/2016   Procedure: left knee arthroscopy with partial medial menisectomy;  Surgeon: Hessie Knows, MD;  Location: [facility] ORS;  Service: Orthopedics;  Laterality: Left;  . TONSILLECTOMY AND ADENOIDECTOMY      MEDICATIONS: . aspirin 81 MG tablet  . fenofibrate 160 MG tablet  . montelukast (SINGULAIR) 10 MG tablet  . pantoprazole (PROTONIX) 40 MG tablet  . sildenafil (REVATIO) 20 MG tablet  . simvastatin (ZOCOR) 20 MG tablet  . telmisartan-hydrochlorothiazide (MICARDIS HCT) 80-25 MG tablet  . traZODone (DESYREL) 100 MG tablet   No current facility-administered medications for this encounter.      Maia Plan [facility] Pre-Surgical Testing [phone] 08/17/19  1:30 PM

## 2019-08-19 MED ORDER — BUPIVACAINE LIPOSOME 1.3 % IJ SUSP
20.0000 mL | Freq: Once | INTRAMUSCULAR | Status: DC
  Filled 2019-08-19: qty 20

## 2019-08-19 NOTE — H&P (Signed)
Chief Complaint:  Tender umbilicus and hernia  History of Present Illness:  Christopher Vazquez is an 53 y.o. male who was seen for an umbilical hernia.  This hurts occasionally when he rubs up against something.  No prior surgery   Past Medical History:  Diagnosis Date  . Complication of anesthesia    with tonsillectomy sever swelling of throat, possible laryngiospasm.  Marland Kitchen GERD (gastroesophageal reflux disease)   . Hyperlipidemia   . Hypertension     Past Surgical History:  Procedure Laterality Date  . CHOLESTEATOMA EXCISION Left 2000   left ear  . COLONOSCOPY WITH PROPOFOL N/A 11/17/2017   Procedure: COLONOSCOPY WITH PROPOFOL;  Surgeon: Lin Landsman, MD;  Location: [facility] ENDOSCOPY;  Service: Gastroenterology;  Laterality: N/A;  . HERNIA REPAIR    . KNEE ARTHROSCOPY WITH MEDIAL MENISECTOMY Left 07/01/2016   Procedure: left knee arthroscopy with partial medial menisectomy;  Surgeon: Hessie Knows, MD;  Location: [facility] ORS;  Service: Orthopedics;  Laterality: Left;  . TONSILLECTOMY AND ADENOIDECTOMY      No current facility-administered medications for this encounter.    Current Outpatient Medications  Medication Sig Dispense Refill  . aspirin 81 MG tablet Take 81 mg by mouth at bedtime.     . fenofibrate 160 MG tablet TAKE 1 TABLET BY MOUTH EVERY DAY (Patient taking differently: Take 160 mg by mouth at bedtime. TAKE 1 TABLET BY MOUTH EVERY DAY) 90 tablet 1  . montelukast (SINGULAIR) 10 MG tablet TAKE 1 TABLET BY MOUTH EVERYDAY AT BEDTIME (Patient taking differently: Take 10 mg by mouth at bedtime. ) 90 tablet 1  . pantoprazole (PROTONIX) 40 MG tablet Take 1 tablet by mouth once daily (Patient taking differently: Take 40 mg by mouth daily. ) 90 tablet 0  . sildenafil (REVATIO) 20 MG tablet TAKE 1 TABLET BY MOUTH THREE TIMES DAILY (Patient taking differently: Take 20 mg by mouth daily as needed (ED). ) 90 tablet 0  . simvastatin (ZOCOR) 20 MG tablet TAKE 1 TABLET BY MOUTH EVERYDAY AT  BEDTIME (Patient taking differently: Take 20 mg by mouth at bedtime. ) 90 tablet 1  . telmisartan-hydrochlorothiazide (MICARDIS HCT) 80-25 MG tablet Take 1 tablet by mouth daily. (Patient taking differently: Take 1 tablet by mouth at bedtime. ) 90 tablet 1  . traZODone (DESYREL) 100 MG tablet Take 1 tablet (100 mg total) by mouth at bedtime as needed for sleep. 90 tablet 1   Scopolamine, Shellfish allergy, and Niacin and related Family History  Problem Relation Age of Onset  . Diabetes Mother   . Hypertension Mother   . Uterine cancer Mother   . Aortic aneurysm Father 80  . Heart disease Father 53  . AAA (abdominal aortic aneurysm) Father    Social History:   reports that he has never smoked. He has never used smokeless tobacco. He reports current alcohol use. He reports that he does not use drugs.   REVIEW OF SYSTEMS : Negative except for see problem list  Physical Exam:   There were no vitals taken for this visit. There is no height or weight on file to calculate BMI.  Gen:  WDWN WM NAD  Neurological: Alert and oriented to person, place, and time. Motor and sensory function is grossly intact  Head: Normocephalic and atraumatic.  Eyes: Conjunctivae are normal. Pupils are equal, round, and reactive to light. No scleral icterus.  Neck: Normal range of motion. Neck supple. No tracheal deviation or thyromegaly present.  Cardiovascular:  SR without murmurs  or gallops.  No carotid bruits Breast:  Not examined Respiratory: Effort normal.  No respiratory distress. No chest wall tenderness. Breath sounds normal.  No wheezes, rales or rhonchi.  Abdomen:  Small tender "outie" umbilical hernia GU:  unremarkable Musculoskeletal: Normal range of motion. Extremities are nontender. No cyanosis, edema or clubbing noted Lymphadenopathy: No cervical, preauricular, postauricular or axillary adenopathy is present Skin: Skin is warm and dry. No rash noted. No diaphoresis. No erythema. No pallor.  Pscyh: Normal mood and affect. Behavior is normal. Judgment and thought content normal.   LABORATORY RESULTS: No results found for this or any previous visit (from the past 48 hour(s)).   RADIOLOGY RESULTS: No results found.  Problem List: Patient Active Problem List   Diagnosis Date Noted  . Special screening for malignant neoplasms, colon   . Encounter for preventive health examination 10/23/2017  . Vasomotor flushing 02/10/2016  . Musculoskeletal pain 02/04/2016  . Prediabetes 08/18/2015  . Obesity 02/09/2015  . Vitamin D deficiency 02/19/2014  . Seasonal allergic rhinitis 02/02/2014  . Dysphagia, unspecified(787.20) 03/07/2013  . Hepatic steatosis 03/07/2013  . Polyarthritis 01/07/2012  . Fatigue 11/14/2011  . Hyperlipidemia   . Essential hypertension     Assessment & Plan: Umbilical hernia-for lap assisted repair.      Matt B. Hassell Done, MD, [facility] [phone] beeper [phone]  08/19/2019 8:48 AM

## 2019-08-20 ENCOUNTER — Encounter (HOSPITAL_COMMUNITY): Payer: Self-pay

## 2019-08-20 ENCOUNTER — Ambulatory Visit (HOSPITAL_COMMUNITY)
Admission: RE | Admit: 2019-08-20 | Discharge: 2019-08-20 | Disposition: A | Discharge status: Home / Self Care | Payer: BC Managed Care – PPO | Attending: Surgery | Admitting: Surgery

## 2019-08-20 ENCOUNTER — Ambulatory Visit (HOSPITAL_COMMUNITY): Payer: BC Managed Care – PPO | Admitting: Physician Assistant

## 2019-08-20 ENCOUNTER — Ambulatory Visit (HOSPITAL_COMMUNITY): Payer: BC Managed Care – PPO | Admitting: Certified Registered"

## 2019-08-20 ENCOUNTER — Encounter (HOSPITAL_COMMUNITY)
Admission: RE | Disposition: A | Discharge status: Home / Self Care | Payer: Self-pay | Source: Home / Self Care | Attending: Surgery

## 2019-08-20 DIAGNOSIS — M130 Polyarthritis, unspecified: Secondary | ICD-10-CM | POA: Diagnosis not present

## 2019-08-20 DIAGNOSIS — Z6835 Body mass index (BMI) 35.0-35.9, adult: Secondary | ICD-10-CM | POA: Insufficient documentation

## 2019-08-20 DIAGNOSIS — I10 Essential (primary) hypertension: Secondary | ICD-10-CM | POA: Diagnosis not present

## 2019-08-20 DIAGNOSIS — E785 Hyperlipidemia, unspecified: Secondary | ICD-10-CM | POA: Insufficient documentation

## 2019-08-20 DIAGNOSIS — Z7982 Long term (current) use of aspirin: Secondary | ICD-10-CM | POA: Diagnosis not present

## 2019-08-20 DIAGNOSIS — E669 Obesity, unspecified: Secondary | ICD-10-CM | POA: Diagnosis not present

## 2019-08-20 DIAGNOSIS — K219 Gastro-esophageal reflux disease without esophagitis: Secondary | ICD-10-CM | POA: Insufficient documentation

## 2019-08-20 DIAGNOSIS — E559 Vitamin D deficiency, unspecified: Secondary | ICD-10-CM | POA: Diagnosis not present

## 2019-08-20 DIAGNOSIS — K429 Umbilical hernia without obstruction or gangrene: Secondary | ICD-10-CM | POA: Diagnosis not present

## 2019-08-20 DIAGNOSIS — Z79899 Other long term (current) drug therapy: Secondary | ICD-10-CM | POA: Diagnosis not present

## 2019-08-20 HISTORY — PX: UMBILICAL HERNIA REPAIR: SHX196

## 2019-08-20 SURGERY — REPAIR, HERNIA, UMBILICAL, LAPAROSCOPIC
Anesthesia: General | Site: Abdomen

## 2019-08-20 MED ORDER — SUGAMMADEX SODIUM 200 MG/2ML IV SOLN
INTRAVENOUS | Status: DC | PRN
  Administered 2019-08-20: 200 mg via INTRAVENOUS

## 2019-08-20 MED ORDER — DEXAMETHASONE SODIUM PHOSPHATE 10 MG/ML IJ SOLN
INTRAMUSCULAR | Status: AC
  Filled 2019-08-20: qty 1

## 2019-08-20 MED ORDER — BUPIVACAINE LIPOSOME 1.3 % IJ SUSP
INTRAMUSCULAR | Status: DC | PRN
  Administered 2019-08-20: 20 mL

## 2019-08-20 MED ORDER — SUCCINYLCHOLINE CHLORIDE 200 MG/10ML IV SOSY
PREFILLED_SYRINGE | INTRAVENOUS | Status: AC
  Filled 2019-08-20: qty 10

## 2019-08-20 MED ORDER — FENTANYL CITRATE (PF) 100 MCG/2ML IJ SOLN
25.0000 ug | INTRAMUSCULAR | Status: DC | PRN
  Administered 2019-08-20 (×2): 50 ug via INTRAVENOUS

## 2019-08-20 MED ORDER — MEPERIDINE HCL 50 MG/ML IJ SOLN: 6.2500 mg | INTRAMUSCULAR | Status: DC | PRN

## 2019-08-20 MED ORDER — ACETAMINOPHEN 160 MG/5ML PO SOLN: 325.0000 mg | ORAL | Status: DC | PRN

## 2019-08-20 MED ORDER — HEPARIN SODIUM (PORCINE) 5000 UNIT/ML IJ SOLN
5000.0000 [IU] | Freq: Once | INTRAMUSCULAR | Status: AC
  Administered 2019-08-20: 5000 [IU] via SUBCUTANEOUS
  Filled 2019-08-20: qty 1

## 2019-08-20 MED ORDER — LIDOCAINE 2% (20 MG/ML) 5 ML SYRINGE
INTRAMUSCULAR | Status: DC | PRN
  Administered 2019-08-20: 1.5 mg/kg/h via INTRAVENOUS

## 2019-08-20 MED ORDER — ONDANSETRON HCL 4 MG/2ML IJ SOLN
INTRAMUSCULAR | Status: DC | PRN
  Administered 2019-08-20: 4 mg via INTRAVENOUS

## 2019-08-20 MED ORDER — ACETAMINOPHEN 325 MG PO TABS: 325.0000 mg | ORAL_TABLET | ORAL | Status: DC | PRN

## 2019-08-20 MED ORDER — FENTANYL CITRATE (PF) 250 MCG/5ML IJ SOLN
INTRAMUSCULAR | Status: DC | PRN
  Administered 2019-08-20: 100 ug via INTRAVENOUS

## 2019-08-20 MED ORDER — FENTANYL CITRATE (PF) 100 MCG/2ML IJ SOLN
INTRAMUSCULAR | Status: AC
  Filled 2019-08-20: qty 2

## 2019-08-20 MED ORDER — KETAMINE HCL 10 MG/ML IJ SOLN
INTRAMUSCULAR | Status: DC | PRN
  Administered 2019-08-20 (×2): 15 mg via INTRAVENOUS

## 2019-08-20 MED ORDER — HYDROCODONE-ACETAMINOPHEN 5-325 MG PO TABS: 1.0000 | ORAL_TABLET | Freq: Four times a day (QID) | ORAL | 0 refills | Status: DC | PRN

## 2019-08-20 MED ORDER — LIDOCAINE 2% (20 MG/ML) 5 ML SYRINGE
INTRAMUSCULAR | Status: AC
  Filled 2019-08-20: qty 5

## 2019-08-20 MED ORDER — ONDANSETRON HCL 4 MG/2ML IJ SOLN
INTRAMUSCULAR | Status: AC
  Filled 2019-08-20: qty 2

## 2019-08-20 MED ORDER — PROMETHAZINE HCL 25 MG/ML IJ SOLN: 6.2500 mg | INTRAMUSCULAR | Status: DC | PRN

## 2019-08-20 MED ORDER — CHLORHEXIDINE GLUCONATE CLOTH 2 % EX PADS: 6.0000 | MEDICATED_PAD | Freq: Once | CUTANEOUS | Status: DC

## 2019-08-20 MED ORDER — DEXAMETHASONE SODIUM PHOSPHATE 10 MG/ML IJ SOLN
INTRAMUSCULAR | Status: DC | PRN
  Administered 2019-08-20: 8 mg via INTRAVENOUS

## 2019-08-20 MED ORDER — LACTATED RINGERS IV SOLN
INTRAVENOUS | Status: DC
  Administered 2019-08-20 (×2): via INTRAVENOUS

## 2019-08-20 MED ORDER — OXYCODONE HCL 5 MG PO TABS: 5.0000 mg | ORAL_TABLET | Freq: Once | ORAL | Status: DC | PRN

## 2019-08-20 MED ORDER — PROPOFOL 10 MG/ML IV BOLUS
INTRAVENOUS | Status: DC | PRN
  Administered 2019-08-20: 200 mg via INTRAVENOUS

## 2019-08-20 MED ORDER — KETAMINE HCL 10 MG/ML IJ SOLN
INTRAMUSCULAR | Status: AC
  Filled 2019-08-20: qty 1

## 2019-08-20 MED ORDER — PROPOFOL 10 MG/ML IV BOLUS
INTRAVENOUS | Status: AC
  Filled 2019-08-20: qty 20

## 2019-08-20 MED ORDER — MIDAZOLAM HCL 2 MG/2ML IJ SOLN
INTRAMUSCULAR | Status: AC
  Filled 2019-08-20: qty 2

## 2019-08-20 MED ORDER — KETOROLAC TROMETHAMINE 30 MG/ML IJ SOLN: 30.0000 mg | Freq: Once | INTRAMUSCULAR | Status: DC | PRN

## 2019-08-20 MED ORDER — ROCURONIUM BROMIDE 10 MG/ML (PF) SYRINGE
PREFILLED_SYRINGE | INTRAVENOUS | Status: AC
  Filled 2019-08-20: qty 10

## 2019-08-20 MED ORDER — LIDOCAINE 2% (20 MG/ML) 5 ML SYRINGE
INTRAMUSCULAR | Status: DC | PRN
  Administered 2019-08-20: 100 mg via INTRAVENOUS

## 2019-08-20 MED ORDER — 0.9 % SODIUM CHLORIDE (POUR BTL) OPTIME
TOPICAL | Status: DC | PRN
  Administered 2019-08-20: 1000 mL

## 2019-08-20 MED ORDER — OXYCODONE HCL 5 MG/5ML PO SOLN: 5.0000 mg | Freq: Once | ORAL | Status: DC | PRN

## 2019-08-20 MED ORDER — MIDAZOLAM HCL 2 MG/2ML IJ SOLN
INTRAMUSCULAR | Status: DC | PRN
  Administered 2019-08-20: 2 mg via INTRAVENOUS

## 2019-08-20 MED ORDER — LIDOCAINE HCL 2 % IJ SOLN
INTRAMUSCULAR | Status: AC
  Filled 2019-08-20: qty 20

## 2019-08-20 MED ORDER — ACETAMINOPHEN 500 MG PO TABS
1000.0000 mg | ORAL_TABLET | ORAL | Status: AC
  Administered 2019-08-20: 1000 mg via ORAL
  Filled 2019-08-20: qty 2

## 2019-08-20 MED ORDER — CEFAZOLIN SODIUM-DEXTROSE 2-4 GM/100ML-% IV SOLN
2.0000 g | INTRAVENOUS | Status: AC
  Administered 2019-08-20: 2 g via INTRAVENOUS
  Filled 2019-08-20: qty 100

## 2019-08-20 MED ORDER — SUCCINYLCHOLINE CHLORIDE 200 MG/10ML IV SOSY
PREFILLED_SYRINGE | INTRAVENOUS | Status: DC | PRN
  Administered 2019-08-20: 160 mg via INTRAVENOUS

## 2019-08-20 MED ORDER — ROCURONIUM BROMIDE 10 MG/ML (PF) SYRINGE
PREFILLED_SYRINGE | INTRAVENOUS | Status: DC | PRN
  Administered 2019-08-20: 40 mg via INTRAVENOUS
  Administered 2019-08-20: 10 mg via INTRAVENOUS

## 2019-08-20 SURGICAL SUPPLY — 43 items
BINDER ABDOMINAL 12 ML 46-62 (SOFTGOODS) ×2 IMPLANT
COVER SURGICAL LIGHT HANDLE (MISCELLANEOUS) ×2 IMPLANT
COVER WAND RF STERILE (DRAPES) IMPLANT
DECANTER SPIKE VIAL GLASS SM (MISCELLANEOUS) ×2 IMPLANT
DERMABOND ADVANCED (GAUZE/BANDAGES/DRESSINGS) ×1
DERMABOND ADVANCED .7 DNX12 (GAUZE/BANDAGES/DRESSINGS) ×1 IMPLANT
DEVICE SECURE STRAP 25 ABSORB (INSTRUMENTS) IMPLANT
DEVICE TROCAR PUNCTURE CLOSURE (ENDOMECHANICALS) IMPLANT
DISSECTOR BLUNT TIP ENDO 5MM (MISCELLANEOUS) IMPLANT
DRAIN CHANNEL 19F RND (DRAIN) IMPLANT
ELECT PENCIL ROCKER SW 15FT (MISCELLANEOUS) ×2 IMPLANT
ELECT REM PT RETURN 15FT ADLT (MISCELLANEOUS) ×2 IMPLANT
EVACUATOR SILICONE 100CC (DRAIN) IMPLANT
GLOVE BIOGEL M 8.0 STRL (GLOVE) ×2 IMPLANT
GOWN SPEC L4 XLG W/TWL (GOWN DISPOSABLE) ×2 IMPLANT
GOWN STRL REUS W/TWL XL LVL3 (GOWN DISPOSABLE) ×8 IMPLANT
IRRIG SUCT STRYKERFLOW 2 WTIP (MISCELLANEOUS)
IRRIGATION SUCT STRKRFLW 2 WTP (MISCELLANEOUS) IMPLANT
KIT BASIN OR (CUSTOM PROCEDURE TRAY) ×2 IMPLANT
KIT TURNOVER KIT A (KITS) IMPLANT
MARKER SKIN DUAL TIP RULER LAB (MISCELLANEOUS) IMPLANT
MESH VENTRALEX ST 1-7/10 CRC S (Mesh General) ×2 IMPLANT
NEEDLE SPNL 22GX3.5 QUINCKE BK (NEEDLE) IMPLANT
PAD POSITIONING PINK XL (MISCELLANEOUS) IMPLANT
PROTECTOR NERVE ULNAR (MISCELLANEOUS) IMPLANT
SCISSORS LAP 5X45 EPIX DISP (ENDOMECHANICALS) ×2 IMPLANT
SET TUBE SMOKE EVAC HIGH FLOW (TUBING) ×2 IMPLANT
SLEEVE XCEL OPT CAN 5 100 (ENDOMECHANICALS) ×2 IMPLANT
SOL ANTI FOG 6CC (MISCELLANEOUS) ×1 IMPLANT
SOLUTION ANTI FOG 6CC (MISCELLANEOUS) ×1
STAPLER VISISTAT 35W (STAPLE) ×2 IMPLANT
STRIP CLOSURE SKIN 1/2X4 (GAUZE/BANDAGES/DRESSINGS) IMPLANT
SUT NOVA 0 T19/GS 22DT (SUTURE) IMPLANT
SUT NOVA NAB DX-16 0-1 5-0 T12 (SUTURE) IMPLANT
SUT NOVA NAB GS-21 0 18 T12 DT (SUTURE) ×4 IMPLANT
SUT VIC AB 4-0 SH 18 (SUTURE) ×2 IMPLANT
TACKER 5MM HERNIA 3.5CML NAB (ENDOMECHANICALS) ×2 IMPLANT
TOWEL OR 17X26 10 PK STRL BLUE (TOWEL DISPOSABLE) ×2 IMPLANT
TOWEL OR NON WOVEN STRL DISP B (DISPOSABLE) ×2 IMPLANT
TRAY FOLEY MTR SLVR 16FR STAT (SET/KITS/TRAYS/PACK) ×2 IMPLANT
TRAY LAPAROSCOPIC (CUSTOM PROCEDURE TRAY) ×2 IMPLANT
TROCAR BLADELESS OPT 5 100 (ENDOMECHANICALS) ×2 IMPLANT
TROCAR XCEL NON-BLD 11X100MML (ENDOMECHANICALS) ×2 IMPLANT

## 2019-08-20 NOTE — Interval H&P Note (Signed)
History and Physical Interval Note:  08/20/2019 11:27 AM  Christopher Vazquez  has presented today for surgery, with the diagnosis of PAINFUL UMBILICAL HERNIA.  The various methods of treatment have been discussed with the patient and family. After consideration of risks, benefits and other options for treatment, the patient has consented to  Procedure(s): [place] (N/A) as a surgical intervention.  The patient's history has been reviewed, patient examined, no change in status, stable for surgery.  I have reviewed the patient's chart and labs.  Questions were answered to the patient's satisfaction.     Christopher Vazquez

## 2019-08-20 NOTE — Transfer of Care (Signed)
Immediate Anesthesia Transfer of Care Note  Patient: Christopher Vazquez  Procedure(s) Performed: LAPAROSCOPIC ASSISTED UMBILICAL HERNIA REPAIR WITH MESH (N/A Abdomen)  Patient Location: PACU  Anesthesia Type:General  Level of Consciousness: awake, alert  and oriented  Airway & Oxygen Therapy: Patient Spontanous Breathing and Patient connected to face mask oxygen  Post-op Assessment: Report given to RN and Post -op Vital signs reviewed and stable  Post vital signs: Reviewed and stable  Last Vitals:  Vitals Value Taken Time  BP 134/58 08/20/19 1300  Temp    Pulse 68 08/20/19 1301  Resp 11 08/20/19 1301  SpO2 100 % 08/20/19 1301  Vitals shown include unvalidated device data.  Last Pain:  Vitals:   08/20/19 1011  TempSrc:   PainSc: 0-No pain         Complications: No apparent anesthesia complications

## 2019-08-20 NOTE — Anesthesia Procedure Notes (Signed)
Procedure Name: Intubation Date/Time: 08/20/2019 11:52 AM Performed by: Eben Burow, CRNA Pre-anesthesia Checklist: Patient identified, Emergency Drugs available, Suction available, Patient being monitored and Timeout performed Patient Re-evaluated:Patient Re-evaluated prior to induction Oxygen Delivery Method: Circle system utilized Preoxygenation: Pre-oxygenation with 100% oxygen Induction Type: IV induction Ventilation: Mask ventilation without difficulty Laryngoscope Size: Mac and 4 Grade View: Grade II Tube type: Oral Tube size: 7.5 mm Number of attempts: 1 Airway Equipment and Method: Stylet Placement Confirmation: ETT inserted through vocal cords under direct vision,  positive ETCO2 and breath sounds checked- equal and bilateral Secured at: 23 cm Tube secured with: Tape Dental Injury: Teeth and Oropharynx as per pre-operative assessment

## 2019-08-20 NOTE — Op Note (Signed)
Christopher Vazquez  1966-07-25 20 August 2019    PCP:  Crecencio Mc, MD   Surgeon: Kaylyn Lim, MD, FACS  Asst:  none  Anes:  general  Preop Dx: Umbilical hernia Postop Dx: same  Procedure: Lap assisted umbilical hernia repair with 1.7 Ventralex mesh patch Location Surgery: [facility] Complications: none  EBL:   minimal cc  Drains: none  Description of Procedure:  The patient was taken to OR 1 .  After anesthesia was administered and the patient was prepped  with Chloroprep and a timeout was performed.  Access was obtained with a 5 mm Optiview through the left upper quadrant.  The umbilical defect was seen and no incarcerated material was within it.    A curvilinear incision was made below the umbilicus and the hernia umbilical skin was dissected off the hernia.  The sac was entered and removed.  The scope was withdrawn as I inserted a Ventralex mesh patch 1.7--the smallest.  With the straps in place I placed 5 sutures of 0 Novafil to secure the mesh to the fascia.  The abdomen was reinsufflated and the mesh looked good with the coated side to the bowel.  The abdomen was deflated and the umbilical skin secured to the fascia with a 4-0 vicryl.    The patient tolerated the procedure well and was taken to the PACU in stable condition.     Matt B. Hassell Done, [place], [facility], [place]

## 2019-08-20 NOTE — Anesthesia Preprocedure Evaluation (Addendum)
Anesthesia Evaluation  Patient identified by MRN, date of birth, ID band Patient awake    Reviewed: Allergy & Precautions, NPO status , Patient's Chart, lab work & pertinent test results  Airway Mallampati: I       Dental no notable dental hx. (+) Teeth Intact   Pulmonary neg pulmonary ROS,    Pulmonary exam normal breath sounds clear to auscultation       Cardiovascular hypertension, Pt. on medications Normal cardiovascular exam Rhythm:Regular Rate:Normal     Neuro/Psych negative neurological ROS  negative psych ROS   GI/Hepatic Neg liver ROS, GERD  Medicated,  Endo/Other  negative endocrine ROS  Renal/GU negative Renal ROS  negative genitourinary   Musculoskeletal negative musculoskeletal ROS (+)   Abdominal (+) + obese,   Peds  Hematology negative hematology ROS (+)   Anesthesia Other Findings   Reproductive/Obstetrics                            Anesthesia Physical Anesthesia Plan  ASA: II  Anesthesia Plan: General   Post-op Pain Management:    Induction: Intravenous  PONV Risk Score and Plan: 4 or greater and Ondansetron, Dexamethasone and Midazolam  Airway Management Planned: Oral ETT  Additional Equipment: None  Intra-op Plan:   Post-operative Plan:   Informed Consent: I have reviewed the patients History and Physical, chart, labs and discussed the procedure including the risks, benefits and alternatives for the proposed anesthesia with the patient or authorized representative who has indicated his/her understanding and acceptance.     Dental advisory given  Plan Discussed with: CRNA  Anesthesia Plan Comments:         Anesthesia Quick Evaluation

## 2019-08-20 NOTE — Discharge Instructions (Signed)
Umbilical Hernia, Adult  A hernia is a bulge of tissue that pushes through an opening between muscles. An umbilical hernia happens in the abdomen, near the belly button (umbilicus). The hernia may contain tissues from the small intestine, large intestine, or fatty tissue covering the intestines (omentum). Umbilical hernias in adults tend to get worse over time, and they require surgical treatment. There are several types of umbilical hernias. You may have:  A hernia located just above or below the umbilicus (indirect hernia). This is the most common type of umbilical hernia in adults.  A hernia that forms through an opening formed by the umbilicus (direct hernia).  A hernia that comes and goes (reducible hernia). A reducible hernia may be visible only when you strain, lift something heavy, or cough. This type of hernia can be pushed back into the abdomen (reduced).  A hernia that traps abdominal tissue inside the hernia (incarcerated hernia). This type of hernia cannot be reduced.  A hernia that cuts off blood flow to the tissues inside the hernia (strangulated hernia). The tissues can start to die if this happens. This type of hernia requires emergency treatment. What are the causes? An umbilical hernia happens when tissue inside the abdomen presses on a weak area of the abdominal muscles. What increases the risk? You may have a greater risk of this condition if you:  Are obese.  Have had several pregnancies.  Have a buildup of fluid inside your abdomen (ascites).  Have had surgery that weakens the abdominal muscles. What are the signs or symptoms? The main symptom of this condition is a painless bulge at or near the belly button. A reducible hernia may be visible only when you strain, lift something heavy, or cough. Other symptoms may include:  Dull pain.  A feeling of pressure. Symptoms of a strangulated hernia may include:  Pain that gets increasingly worse.  Nausea and  vomiting.  Pain when pressing on the hernia.  Skin over the hernia becoming red or purple.  Constipation.  Blood in the stool. How is this diagnosed? This condition may be diagnosed based on:  A physical exam. You may be asked to cough or strain while standing. These actions increase the pressure inside your abdomen and force the hernia through the opening in your muscles. Your health care provider may try to reduce the hernia by pressing on it.  Your symptoms and medical history. How is this treated? Surgery is the only treatment for an umbilical hernia. Surgery for a strangulated hernia is done as soon as possible. If you have a small hernia that is not incarcerated, you may need to lose weight before having surgery. Follow these instructions at home:  Lose weight, if told by your health care provider.  Do not try to push the hernia back in.  Watch your hernia for any changes in color or size. Tell your health care provider if any changes occur.  You may need to avoid activities that increase pressure on your hernia.  Do not lift anything that is heavier than 10 lb (4.5 kg) until your health care provider says that this is safe.  Take over-the-counter and prescription medicines only as told by your health care provider.  Keep all follow-up visits as told by your health care provider. This is important. Contact a health care provider if:  Your hernia gets larger.  Your hernia becomes painful. Get help right away if:  You develop sudden, severe pain near the area of your hernia.    You have pain as well as nausea or vomiting.  You have pain and the skin over your hernia changes color.  You develop a fever. This information is not intended to replace advice given to you by your health care provider. Make sure you discuss any questions you have with your health care provider. Document Released: 03/26/2016 Document Revised: 12/07/2017 Document Reviewed: 04/25/2017 Elsevier  Patient Education  2020 [employer]  

## 2019-08-21 ENCOUNTER — Encounter (HOSPITAL_COMMUNITY): Payer: Self-pay | Admitting: Surgery

## 2019-08-23 NOTE — Anesthesia Postprocedure Evaluation (Signed)
Anesthesia Post Note  Patient: Christopher Vazquez  Procedure(s) Performed: LAPAROSCOPIC ASSISTED UMBILICAL HERNIA REPAIR WITH MESH (N/A Abdomen)     Patient location during evaluation: PACU Anesthesia Type: General Level of consciousness: awake Pain management: pain level controlled Vital Signs Assessment: post-procedure vital signs reviewed and stable Respiratory status: spontaneous breathing Cardiovascular status: stable Postop Assessment: no apparent nausea or vomiting Anesthetic complications: no    Last Vitals:  Vitals:   08/20/19 1345 08/20/19 1400  BP: 119/78 128/86  Pulse: 62 68  Resp: 14 18  Temp:  36.8 C  SpO2: 100% 100%    Last Pain:  Vitals:   08/20/19 1400  TempSrc:   PainSc: 3    Pain Goal:                   Huston Foley

## 2019-09-14 ENCOUNTER — Ambulatory Visit (INDEPENDENT_AMBULATORY_CARE_PROVIDER_SITE_OTHER): Payer: BC Managed Care – PPO

## 2019-09-14 ENCOUNTER — Other Ambulatory Visit: Payer: Self-pay

## 2019-09-14 DIAGNOSIS — R079 Chest pain, unspecified: Secondary | ICD-10-CM

## 2019-09-14 DIAGNOSIS — I10 Essential (primary) hypertension: Secondary | ICD-10-CM

## 2019-09-18 ENCOUNTER — Encounter: Payer: Self-pay | Admitting: Cardiology

## 2019-09-18 ENCOUNTER — Ambulatory Visit (INDEPENDENT_AMBULATORY_CARE_PROVIDER_SITE_OTHER): Payer: BC Managed Care – PPO | Admitting: Cardiology

## 2019-09-18 ENCOUNTER — Other Ambulatory Visit: Payer: Self-pay

## 2019-09-18 VITALS — BP 120/74 | HR 81 | Temp 97.2°F | Ht 66.5 in | Wt 230.2 lb

## 2019-09-18 DIAGNOSIS — E669 Obesity, unspecified: Secondary | ICD-10-CM | POA: Diagnosis not present

## 2019-09-18 DIAGNOSIS — I10 Essential (primary) hypertension: Secondary | ICD-10-CM | POA: Diagnosis not present

## 2019-09-18 NOTE — Progress Notes (Signed)
Cardiology Office Note:    Date:  09/18/2019   ID:  Christopher Vazquez, DOB 08-06-1966, MRN [id number]  PCP:  Crecencio Mc, MD  Cardiologist:  Kate Sable, MD  Electrophysiologist:  None   Referring MD: Crecencio Mc, MD   Chief Complaint  Patient presents with  . office visit    6 week F/U. Meds verbally reviewed with patient.    History of Present Illness:    Christopher Vazquez is a 53 y.o. male with a hx of hypertension, hyperlipidemia, who presents for a follow-up.  He was originally seen as a follow-up from the emergency room due to chest tightness.  At the time he complained of chest tightness and dizziness while walking with his wife. He then went home, checked his blood pressure which was elevated with [id number] systolics and [id number] diastolic. Patient then drove himself to the emergency room, upon arrival and blood pressure check, it was within normal limits.  Evaluation in the ED was unrevealing.  Patient was then discharged home.  He states having occasional chest tightness not related with exertion.  He denies dyspnea on exertion, edema, orthopnea, palpitations.  He mentions having an ectatic abdominal aorta.  His father has a history of abdominal aortic aneurysm and also thoracic aortic aneurysm.  His dad was a smoker and had an MI at age 42.  His thoracic aorta has never been imaged.  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  His chest tightness was deemed atypical and in the setting of hypertension.  Echocardiogram was ordered due to history of ectatic abdominal aorta.  He now presents for results.  He has not had any more blood pressure spikes. Past Medical History:  Diagnosis Date  . Complication of anesthesia    with tonsillectomy sever swelling of throat, possible laryngiospasm.  Marland Kitchen GERD (gastroesophageal reflux disease)   . Hyperlipidemia   . Hypertension     Past Surgical History:  Procedure Laterality Date  . CHOLESTEATOMA  EXCISION Left 2000   left ear  . COLONOSCOPY WITH PROPOFOL N/A 11/17/2017   Procedure: COLONOSCOPY WITH PROPOFOL;  Surgeon: Lin Landsman, MD;  Location: [facility] ENDOSCOPY;  Service: Gastroenterology;  Laterality: N/A;  . HERNIA REPAIR    . KNEE ARTHROSCOPY WITH MEDIAL MENISECTOMY Left 07/01/2016   Procedure: left knee arthroscopy with partial medial menisectomy;  Surgeon: Hessie Knows, MD;  Location: [facility] ORS;  Service: Orthopedics;  Laterality: Left;  . TONSILLECTOMY AND ADENOIDECTOMY    . UMBILICAL HERNIA REPAIR N/A 08/20/2019   Procedure: LAPAROSCOPIC ASSISTED UMBILICAL HERNIA REPAIR WITH MESH;  Surgeon: Johnathan Hausen, MD;  Location: [facility] ORS;  Service: General;  Laterality: N/A;    Current Medications: Current Meds  Medication Sig  . aspirin 81 MG tablet Take 81 mg by mouth at bedtime.   . fenofibrate 160 MG tablet TAKE 1 TABLET BY MOUTH EVERY DAY (Patient taking differently: Take 160 mg by mouth at bedtime. TAKE 1 TABLET BY MOUTH EVERY DAY)  . HYDROcodone-acetaminophen (NORCO/VICODIN) 5-325 MG tablet Take 1 tablet by mouth every 6 (six) hours as needed for moderate pain.  . montelukast (SINGULAIR) 10 MG tablet TAKE 1 TABLET BY MOUTH EVERYDAY AT BEDTIME (Patient taking differently: Take 10 mg by mouth at bedtime. )  . pantoprazole (PROTONIX) 40 MG tablet Take 1 tablet by mouth once daily (Patient taking differently: Take 40 mg by mouth daily. )  . sildenafil (REVATIO) 20 MG tablet TAKE 1 TABLET BY  MOUTH THREE TIMES DAILY (Patient taking differently: Take 20 mg by mouth daily as needed (ED). )  . simvastatin (ZOCOR) 20 MG tablet TAKE 1 TABLET BY MOUTH EVERYDAY AT BEDTIME (Patient taking differently: Take 20 mg by mouth at bedtime. )  . telmisartan-hydrochlorothiazide (MICARDIS HCT) 80-25 MG tablet Take 1 tablet by mouth daily. (Patient taking differently: Take 1 tablet by mouth at bedtime. )  . traZODone (DESYREL) 100 MG tablet Take 1 tablet (100 mg total) by mouth at bedtime as needed  for sleep.     Allergies:   Scopolamine, Shellfish allergy, and Niacin and related   Social History   Socioeconomic History  . Marital status: Married    Spouse name: Not on file  . Number of children: 3  . Years of education: Not on file  . Highest education level: Not on file  Occupational History  . Occupation: Buyer, retail: [employer]  . Financial resource strain: Not on file  . Food insecurity    Worry: Not on file    Inability: Not on file  . Transportation needs    Medical: Not on file    Non-medical: Not on file  Tobacco Use  . Smoking status: Never Smoker  . Smokeless tobacco: Never Used  Substance and Sexual Activity  . Alcohol use: Yes    Comment: 1-2 drinks every 3-4 months  . Drug use: No  . Sexual activity: Not on file  Lifestyle  . Physical activity    Days per week: Not on file    Minutes per session: Not on file  . Stress: Not on file  Relationships  . Social Herbalist on phone: Not on file    Gets together: Not on file    Attends religious service: Not on file    Active member of club or organization: Not on file    Attends meetings of clubs or organizations: Not on file    Relationship status: Not on file  Other Topics Concern  . Not on file  Social History Narrative  . Not on file     Family History: The patient's family history includes AAA (abdominal aortic aneurysm) in his father; Aortic aneurysm (age of onset: 31) in his father; Diabetes in his mother; Heart disease (age of onset: 59) in his father; Hypertension in his mother; Uterine cancer in his mother.  ROS:   Please see the history of present illness.     All other systems reviewed and are negative.  EKGs/Labs/Other Studies Reviewed:    The following studies were reviewed today: TTE 09-29-2019 1. Left ventricular ejection fraction, by visual estimation, is 60 to 65%. The left ventricle has normal function. There is no left ventricular  hypertrophy.  2. Global right ventricle has normal systolic function.The right ventricular size is normal. No increase in right ventricular wall thickness.  3. Left atrial size was mildly dilated.  4. The pulmonic valve was normal in structure. Pulmonic valve regurgitation is not visualized.  5. TR signal is inadequate for assessing pulmonary artery systolic pressure.  EKG:  EKG is  ordered today.  The ekg ordered today demonstrates normal sinus rhythm, normal ECG.  Recent Labs: 12/08/2018: ALT 36 08/16/2019: BUN 17; Creatinine, Ser 1.26; Hemoglobin 14.0; Platelets 224; Potassium 4.1; Sodium 140  Recent Lipid Panel    Component Value Date/Time   CHOL 117 12/08/2018 1556   TRIG 219 (H) 12/08/2018 1556   HDL 25 (L) 12/08/2018  1556   CHOLHDL 4.7 12/08/2018 1556   LDLCALC 63 12/08/2018 1556    Physical Exam:    VS:  BP 120/74 (BP Location: Left Arm, Patient Position: Sitting, Cuff Size: Normal)   Pulse 81   Temp (!) 97.2 F (36.2 C)   Ht 5' 6.5" (1.689 m)   Wt 230 lb 4 oz (104.4 kg)   SpO2 97%   BMI 36.61 kg/m     Wt Readings from Last 3 Encounters:  09/18/19 230 lb 4 oz (104.4 kg)  08/20/19 228 lb (103.4 kg)  08/16/19 228 lb (103.4 kg)     GEN:  Well nourished, well developed in no acute distress HEENT: Normal NECK: No JVD; No carotid bruits LYMPHATICS: No lymphadenopathy CARDIAC: RRR, no murmurs, rubs, gallops RESPIRATORY:  Clear to auscultation without rales, wheezing or rhonchi  ABDOMEN: Soft, non-tender, non-distended MUSCULOSKELETAL:  No edema; No deformity  SKIN: Warm and dry NEUROLOGIC:  Alert and oriented x 3 PSYCHIATRIC:  Normal affect   ASSESSMENT:   Echocardiogram showed normal ejection fraction, normal systolic function, normal diastolic function.  Mild left atrial enlargement.  Aorta appears normal.  Blood pressure today is within normal limits.  Patient has not had any more blood pressure spikes. 1. Essential hypertension   2. Obesity (BMI 35.0-39.9  without comorbidity)    PLAN:    In order of problems listed above:  1. Continue telmisartan HCTZ for blood pressure control. 2. Weight loss advised.  Follow-up as needed.  Follow-up after echo. Medication Adjustments/Labs and Tests Ordered: Current medicines are reviewed at length with the patient today.  Concerns regarding medicines are outlined above.  Orders Placed This Encounter  Procedures  . EKG 12-Lead   No orders of the defined types were placed in this encounter.   Patient Instructions  Medication Instructions:  Your physician recommends that you continue on your current medications as directed. Please refer to the Current Medication list given to you today.  *If you need a refill on your cardiac medications before your next appointment, please call your pharmacy*  Lab Work: None ordered If you have labs (blood work) drawn today and your tests are completely normal, you will receive your results only by: Marland Kitchen MyChart Message (if you have MyChart) OR . A paper copy in the mail If you have any lab test that is abnormal or we need to change your treatment, we will call you to review the results.  Testing/Procedures: None ordered  Follow-Up: At [facility], you and your health needs are our priority.  As part of our continuing mission to provide you with exceptional heart care, we have created designated Provider Care Teams.  These Care Teams include your primary Cardiologist (physician) and Advanced Practice Providers (APPs -  Physician Assistants and Nurse Practitioners) who all work together to provide you with the care you need, when you need it.  Your next appointment:   As needed  The format for your next appointment:   In Person  Provider:    You may see Kate Sable, MD or one of the following Advanced Practice Providers on your designated Care Team:    Murray Hodgkins, NP  Christell Faith, PA-C  Marrianne Mood, PA-C   Other Instructions N/A      Signed, Kate Sable, MD  09/18/2019 8:17 AM    [place]

## 2019-09-18 NOTE — Patient Instructions (Signed)
Medication Instructions:  Your physician recommends that you continue on your current medications as directed. Please refer to the Current Medication list given to you today.  *If you need a refill on your cardiac medications before your next appointment, please call your pharmacy*  Lab Work: None ordered If you have labs (blood work) drawn today and your tests are completely normal, you will receive your results only by: . MyChart Message (if you have MyChart) OR . A paper copy in the mail If you have any lab test that is abnormal or we need to change your treatment, we will call you to review the results.  Testing/Procedures: None ordered  Follow-Up: At [facility], you and your health needs are our priority.  As part of our continuing mission to provide you with exceptional heart care, we have created designated Provider Care Teams.  These Care Teams include your primary Cardiologist (physician) and Advanced Practice Providers (APPs -  Physician Assistants and Nurse Practitioners) who all work together to provide you with the care you need, when you need it.  Your next appointment:   As needed   The format for your next appointment:   In Person  Provider:    You may see Brian Agbor-Etang, MD or one of the following Advanced Practice Providers on your designated Care Team:    Christopher Berge, NP  Ryan Dunn, PA-C  Jacquelyn Visser, PA-C   Other Instructions N/A  

## 2019-09-26 ENCOUNTER — Other Ambulatory Visit: Payer: Self-pay

## 2019-09-26 MED ORDER — TRAZODONE HCL 100 MG PO TABS: 100.0000 mg | ORAL_TABLET | Freq: Every evening | ORAL | 0 refills | Status: DC | PRN

## 2019-10-01 ENCOUNTER — Other Ambulatory Visit: Payer: Self-pay | Admitting: Internal Medicine

## 2019-10-11 ENCOUNTER — Other Ambulatory Visit: Payer: Self-pay

## 2019-10-11 DIAGNOSIS — Z20822 Contact with and (suspected) exposure to covid-19: Secondary | ICD-10-CM

## 2019-10-14 LAB — NOVEL CORONAVIRUS, NAA: SARS-CoV-2, NAA: NOT DETECTED

## 2019-11-08 DIAGNOSIS — M25512 Pain in left shoulder: Secondary | ICD-10-CM | POA: Diagnosis not present

## 2019-11-16 DIAGNOSIS — M25512 Pain in left shoulder: Secondary | ICD-10-CM | POA: Insufficient documentation

## 2019-11-20 DIAGNOSIS — M25512 Pain in left shoulder: Secondary | ICD-10-CM | POA: Diagnosis not present

## 2019-11-22 ENCOUNTER — Other Ambulatory Visit: Payer: Self-pay | Admitting: Internal Medicine

## 2019-11-23 ENCOUNTER — Other Ambulatory Visit: Payer: Self-pay

## 2019-11-27 DIAGNOSIS — M25512 Pain in left shoulder: Secondary | ICD-10-CM | POA: Diagnosis not present

## 2019-11-28 ENCOUNTER — Other Ambulatory Visit (INDEPENDENT_AMBULATORY_CARE_PROVIDER_SITE_OTHER): Payer: BC Managed Care – PPO

## 2019-11-28 ENCOUNTER — Other Ambulatory Visit: Payer: Self-pay

## 2019-11-28 DIAGNOSIS — E7800 Pure hypercholesterolemia, unspecified: Secondary | ICD-10-CM | POA: Diagnosis not present

## 2019-11-28 DIAGNOSIS — K760 Fatty (change of) liver, not elsewhere classified: Secondary | ICD-10-CM | POA: Diagnosis not present

## 2019-11-28 DIAGNOSIS — R7303 Prediabetes: Secondary | ICD-10-CM

## 2019-11-28 LAB — LIPID PANEL
Cholesterol: 120 mg/dL (ref 0–200)
HDL: 29.7 mg/dL — ABNORMAL LOW (ref >39.00)
LDL Cholesterol: 60 mg/dL (ref 0–99)
NonHDL: 90.42
Total CHOL/HDL Ratio: 4
Triglycerides: 153 mg/dL — ABNORMAL HIGH (ref 0.0–149.0)
VLDL: 30.6 mg/dL (ref 0.0–40.0)

## 2019-11-28 LAB — COMPREHENSIVE METABOLIC PANEL
ALT: 41 U/L (ref 0–53)
AST: 26 U/L (ref 0–37)
Albumin: 4.2 g/dL (ref 3.5–5.2)
Alkaline Phosphatase: 46 U/L (ref 39–117)
BUN: 17 mg/dL (ref 6–23)
CO2: 28 mEq/L (ref 19–32)
Calcium: 9.3 mg/dL (ref 8.4–10.5)
Chloride: 104 mEq/L (ref 96–112)
Creatinine, Ser: 1.23 mg/dL (ref 0.40–1.50)
GFR: 61.63 mL/min (ref >60.00)
Glucose, Bld: 134 mg/dL — ABNORMAL HIGH (ref 70–99)
Potassium: 3.7 mEq/L (ref 3.5–5.1)
Sodium: 138 mEq/L (ref 135–145)
Total Bilirubin: 0.4 mg/dL (ref 0.2–1.2)
Total Protein: 6.8 g/dL (ref 6.0–8.3)

## 2019-11-28 LAB — HEMOGLOBIN A1C: Hgb A1c MFr Bld: 7 % — ABNORMAL HIGH (ref 4.6–6.5)

## 2019-11-30 ENCOUNTER — Other Ambulatory Visit: Payer: Self-pay

## 2019-11-30 ENCOUNTER — Ambulatory Visit (INDEPENDENT_AMBULATORY_CARE_PROVIDER_SITE_OTHER): Payer: BC Managed Care – PPO | Admitting: Internal Medicine

## 2019-11-30 ENCOUNTER — Encounter: Payer: Self-pay | Admitting: Internal Medicine

## 2019-11-30 VITALS — BP 121/82 | Ht 67.0 in | Wt 223.0 lb

## 2019-11-30 DIAGNOSIS — E782 Mixed hyperlipidemia: Secondary | ICD-10-CM | POA: Diagnosis not present

## 2019-11-30 DIAGNOSIS — E119 Type 2 diabetes mellitus without complications: Secondary | ICD-10-CM

## 2019-11-30 DIAGNOSIS — I10 Essential (primary) hypertension: Secondary | ICD-10-CM | POA: Diagnosis not present

## 2019-11-30 DIAGNOSIS — K760 Fatty (change of) liver, not elsewhere classified: Secondary | ICD-10-CM

## 2019-11-30 MED ORDER — METFORMIN HCL 500 MG PO TABS: 500.0000 mg | ORAL_TABLET | Freq: Two times a day (BID) | ORAL | 3 refills | Status: DC

## 2019-11-30 NOTE — Progress Notes (Signed)
Virtual Visit via [email or web address]  This visit type was conducted due to national recommendations for restrictions regarding the COVID-19 pandemic (e.g. social distancing).  This format is felt to be most appropriate for this patient at this time.  All issues noted in this document were discussed and addressed.  No physical exam was performed (except for noted visual exam findings with Video Visits).   I connected with@ on 12/02/19 at  3:30 PM EST by a video enabled telemedicine application and verified that I am speaking with the correct person using two identifiers. Location patient: home Location provider: work or home office Persons participating in the virtual visit: patient, provider  I discussed the limitations, risks, security and privacy concerns of performing an evaluation and management service by telephone and the availability of in person appointments. I also discussed with the patient that there may be a patient responsible charge related to this service. The patient expressed understanding and agreed to proceed.  Reason for visit: follow up on prediabetes, obesity and fatty liver   HPI:  54 yr old male with obesity and fatty liver , nonalcoholic, prediabetes presents for 6 month follow up. Despite his wife's success in losing weight after her bariatric surgery, he has had difficulty losing weight due to restrictions on exercise due to his left shoulder impingement and his recent hiatal hernia repair . Diet reviewed in detail today.    He is following a low glycemic index diet  but eats a pack of NABS every day .    ROS: Patient denies headache, fevers, malaise, unintentional weight loss, skin rash, eye pain, sinus congestion and sinus pain, sore throat, dysphagia,  hemoptysis , cough, dyspnea, wheezing, chest pain, palpitations, orthopnea, edema, abdominal pain, nausea, melena, diarrhea, constipation, flank pain, dysuria, hematuria, urinary  Frequency, nocturia, numbness, tingling, seizures,   Focal weakness, Loss of consciousness,  Tremor, insomnia, depression, anxiety, and suicidal ideation.      Past Medical History:  Diagnosis Date  . Complication of anesthesia    with tonsillectomy sever swelling of throat, possible laryngiospasm.  Marland Kitchen GERD (gastroesophageal reflux disease)   . Hyperlipidemia   . Hypertension     Past Surgical History:  Procedure Laterality Date  . CHOLESTEATOMA EXCISION Left 2000   left ear  . COLONOSCOPY WITH PROPOFOL N/A 11/17/2017   Procedure: COLONOSCOPY WITH PROPOFOL;  Surgeon: Lin Landsman, MD;  Location: [facility];  Service: Gastroenterology;  Laterality: N/A;  . HERNIA REPAIR    . KNEE ARTHROSCOPY WITH MEDIAL MENISECTOMY Left 07/01/2016   Procedure: left knee arthroscopy with partial medial menisectomy;  Surgeon: Hessie Knows, MD;  Location: [facility] ORS;  Service: Orthopedics;  Laterality: Left;  . TONSILLECTOMY AND ADENOIDECTOMY    . UMBILICAL HERNIA REPAIR N/A 08/20/2019   Procedure: LAPAROSCOPIC ASSISTED UMBILICAL HERNIA REPAIR WITH MESH;  Surgeon: Johnathan Hausen, MD;  Location: [facility] ORS;  Service: General;  Laterality: N/A;    Family History  Problem Relation Age of Onset  . Diabetes Mother   . Hypertension Mother   . Uterine cancer Mother   . Aortic aneurysm Father 88  . Heart disease Father 64  . AAA (abdominal aortic aneurysm) Father     SOCIAL HX:  reports that he has never smoked. He has never used smokeless tobacco. He reports current alcohol use. He reports that he does not use drugs.   Current Outpatient Medications:  .  aspirin 81 MG tablet, Take 81 mg by mouth at bedtime. , Disp: , Rfl:  .  fenofibrate 160 MG tablet, TAKE 1 TABLET BY MOUTH EVERY DAY (Patient taking differently: Take 160 mg by mouth at bedtime. TAKE 1 TABLET BY MOUTH EVERY DAY), Disp: 90 tablet, Rfl: 1 .  montelukast (SINGULAIR) 10 MG tablet, TAKE 1 TABLET DAILY AT BEDTIME, Disp: 90 tablet, Rfl: 0 .  pantoprazole (PROTONIX) 40 MG tablet, Take 1  tablet (40 mg total) by mouth daily. Needs to schedule an appt before anymore refills., Disp: 30 tablet, Rfl: 0 .  sildenafil (REVATIO) 20 MG tablet, TAKE 1 TABLET BY MOUTH THREE TIMES DAILY, Disp: 90 tablet, Rfl: 0 .  simvastatin (ZOCOR) 20 MG tablet, TAKE 1 TABLET BY MOUTH EVERYDAY AT BEDTIME (Patient taking differently: Take 20 mg by mouth at bedtime. ), Disp: 90 tablet, Rfl: 1 .  telmisartan-hydrochlorothiazide (MICARDIS HCT) 80-25 MG tablet, Take 1 tablet by mouth daily. (Patient taking differently: Take 1 tablet by mouth at bedtime. ), Disp: 90 tablet, Rfl: 1 .  traZODone (DESYREL) 100 MG tablet, Take 1 tablet (100 mg total) by mouth at bedtime as needed for sleep., Disp: 90 tablet, Rfl: 0 .  HYDROcodone-acetaminophen (NORCO/VICODIN) 5-325 MG tablet, Take 1 tablet by mouth every 6 (six) hours as needed for moderate pain., Disp: 15 tablet, Rfl: 0 .  metFORMIN (GLUCOPHAGE) 500 MG tablet, Take 1 tablet (500 mg total) by mouth 2 (two) times daily with a meal., Disp: 180 tablet, Rfl: 3  EXAM:  VITALS per patient if applicable:  GENERAL: alert, oriented, appears well and in no acute distress  HEENT: atraumatic, conjunttiva clear, no obvious abnormalities on inspection of external nose and ears  NECK: normal movements of the head and neck  LUNGS: on inspection no signs of respiratory distress, breathing rate appears normal, no obvious gross SOB, gasping or wheezing  CV: no obvious cyanosis  MS: moves all visible extremities without noticeable abnormality  PSYCH/NEURO: pleasant and cooperative, no obvious depression or anxiety, speech and thought processing grossly intact  ASSESSMENT AND PLAN:  Discussed the following assessment and plan:  Hepatic steatosis - Plan: Comprehensive metabolic panel  Newly diagnosed diabetes ([place]) - Plan: Microalbumin / creatinine urine ratio, Hemoglobin A1c  Essential hypertension  Mixed hyperlipidemia  Essential hypertension Well controlled on  current regimen. Renal function stable, no changes today. Lab Results  Component Value Date   CREATININE 1.23 11/28/2019   Lab Results  Component Value Date   NA 138 11/28/2019   K 3.7 11/28/2019   CL 104 11/28/2019   CO2 28 11/28/2019     Hepatic steatosis Hepatic enzymes are nearly normalized and he has no signs of cirrhosis or synthetic dysfunction .  continue low glycemic index diet, statin, renew efforts to get  BMI < 30 ,  and startig metformin for new onset type 2 DM   Lab Results  Component Value Date   ALT 41 11/28/2019   AST 26 11/28/2019   ALKPHOS 46 11/28/2019   BILITOT 0.4 11/28/2019       Hyperlipidemia LDL and triglycerides are at goal on fenofibrate and simvastatin. He has no side effects and liver enzymes are normal. No changes today   Newly diagnosed diabetes ([place]) He has transitioned from prediabetes to Type 2 DM with an a1c of 7.0 and fasting glucose > 125 .  Starting metformin 500 mg bid   Lab Results  Component Value Date   HGBA1C 7.0 (H) 11/28/2019     I discussed the assessment and treatment plan with the patient. The patient was provided an opportunity to  ask questions and all were answered. The patient agreed with the plan and demonstrated an understanding of the instructions.   The patient was advised to call back or seek an in-person evaluation if the symptoms worsen or if the condition fails to improve as anticipated. Crecencio Mc, MD

## 2019-11-30 NOTE — Assessment & Plan Note (Signed)
LDL and triglycerides are at goal on fenofibrate and simvastatin. He has no side effects and liver enzymes are normal. No changes today

## 2019-11-30 NOTE — Patient Instructions (Signed)
Starting you on metformin 500 mg twice daily   If the stools become soft or runny,  This should resolve in a week or 2.  You can use imodium as needed.    If it does not resolve,  We can change medication   Repeat labs (nonfasting) on April 22 or later

## 2019-11-30 NOTE — Assessment & Plan Note (Addendum)
Hepatic enzymes are nearly normalized and he has no signs of cirrhosis or synthetic dysfunction .  continue low glycemic index diet, statin, renew efforts to get  BMI < 30 ,  and startig metformin for new onset type 2 DM   Lab Results  Component Value Date   ALT 41 11/28/2019   AST 26 11/28/2019   ALKPHOS 46 11/28/2019   BILITOT 0.4 11/28/2019

## 2019-11-30 NOTE — Assessment & Plan Note (Signed)
Well controlled on current regimen. Renal function stable, no changes today. Lab Results  Component Value Date   CREATININE 1.23 11/28/2019   Lab Results  Component Value Date   NA 138 11/28/2019   K 3.7 11/28/2019   CL 104 11/28/2019   CO2 28 11/28/2019

## 2019-12-02 NOTE — Assessment & Plan Note (Signed)
He has transitioned from prediabetes to Type 2 DM with an a1c of 7.0 and fasting glucose > 125 .  Starting metformin 500 mg bid   Lab Results  Component Value Date   HGBA1C 7.0 (H) 11/28/2019

## 2019-12-06 DIAGNOSIS — M7542 Impingement syndrome of left shoulder: Secondary | ICD-10-CM | POA: Diagnosis not present

## 2019-12-21 ENCOUNTER — Telehealth: Payer: Self-pay | Admitting: Internal Medicine

## 2019-12-21 MED ORDER — TELMISARTAN-HCTZ 80-25 MG PO TABS: 1.0000 | ORAL_TABLET | Freq: Every day | ORAL | 1 refills | Status: DC

## 2019-12-21 NOTE — Telephone Encounter (Signed)
LMTCB. Need to find out what pharmacy pt would like medication sent to.

## 2019-12-21 NOTE — Telephone Encounter (Signed)
Medicaiton has been refilled and sent to [facility] on [address].

## 2019-12-21 NOTE — Telephone Encounter (Signed)
Pt needs a refill on telmisartan-hydrochlorothiazide (MICARDIS HCT) 80-25 MG tablet and also needs a meter.

## 2019-12-21 NOTE — Telephone Encounter (Signed)
Pt would like that sent to [facility] on [address]

## 2020-01-01 ENCOUNTER — Other Ambulatory Visit: Payer: Self-pay

## 2020-01-01 MED ORDER — BLOOD GLUCOSE METER KIT: PACK | 0 refills | Status: DC

## 2020-01-12 ENCOUNTER — Other Ambulatory Visit: Payer: Self-pay | Admitting: Internal Medicine

## 2020-01-21 ENCOUNTER — Other Ambulatory Visit: Payer: Self-pay | Admitting: Internal Medicine

## 2020-01-26 ENCOUNTER — Other Ambulatory Visit: Payer: Self-pay | Admitting: Internal Medicine

## 2020-01-30 ENCOUNTER — Other Ambulatory Visit: Payer: Self-pay

## 2020-01-30 MED ORDER — TELMISARTAN-HCTZ 80-25 MG PO TABS: 1.0000 | ORAL_TABLET | Freq: Every day | ORAL | 1 refills | Status: DC

## 2020-01-30 MED ORDER — MONTELUKAST SODIUM 10 MG PO TABS: ORAL_TABLET | ORAL | 0 refills | Status: DC

## 2020-02-15 ENCOUNTER — Other Ambulatory Visit: Payer: Self-pay | Admitting: Internal Medicine

## 2020-02-27 DIAGNOSIS — M25562 Pain in left knee: Secondary | ICD-10-CM | POA: Diagnosis not present

## 2020-02-27 DIAGNOSIS — M1712 Unilateral primary osteoarthritis, left knee: Secondary | ICD-10-CM | POA: Diagnosis not present

## 2020-03-03 DIAGNOSIS — E782 Mixed hyperlipidemia: Secondary | ICD-10-CM

## 2020-03-06 ENCOUNTER — Other Ambulatory Visit (INDEPENDENT_AMBULATORY_CARE_PROVIDER_SITE_OTHER): Payer: BC Managed Care – PPO

## 2020-03-06 ENCOUNTER — Other Ambulatory Visit: Payer: Self-pay

## 2020-03-06 DIAGNOSIS — E119 Type 2 diabetes mellitus without complications: Secondary | ICD-10-CM

## 2020-03-06 DIAGNOSIS — E782 Mixed hyperlipidemia: Secondary | ICD-10-CM | POA: Diagnosis not present

## 2020-03-06 DIAGNOSIS — K760 Fatty (change of) liver, not elsewhere classified: Secondary | ICD-10-CM

## 2020-03-06 LAB — COMPREHENSIVE METABOLIC PANEL WITH GFR
ALT: 30 U/L (ref 0–53)
AST: 21 U/L (ref 0–37)
Albumin: 4.4 g/dL (ref 3.5–5.2)
Alkaline Phosphatase: 39 U/L (ref 39–117)
BUN: 24 mg/dL — ABNORMAL HIGH (ref 6–23)
CO2: 28 meq/L (ref 19–32)
Calcium: 9.3 mg/dL (ref 8.4–10.5)
Chloride: 104 meq/L (ref 96–112)
Creatinine, Ser: 1.33 mg/dL (ref 0.40–1.50)
GFR: 56.25 mL/min — ABNORMAL LOW
Glucose, Bld: 121 mg/dL — ABNORMAL HIGH (ref 70–99)
Potassium: 4 meq/L (ref 3.5–5.1)
Sodium: 139 meq/L (ref 135–145)
Total Bilirubin: 0.4 mg/dL (ref 0.2–1.2)
Total Protein: 6.9 g/dL (ref 6.0–8.3)

## 2020-03-06 LAB — LIPID PANEL
Cholesterol: 126 mg/dL (ref 0–200)
HDL: 26.2 mg/dL — ABNORMAL LOW
LDL Cholesterol: 76 mg/dL (ref 0–99)
NonHDL: 100.27
Total CHOL/HDL Ratio: 5
Triglycerides: 120 mg/dL (ref 0.0–149.0)
VLDL: 24 mg/dL (ref 0.0–40.0)

## 2020-03-06 LAB — MICROALBUMIN / CREATININE URINE RATIO
Creatinine,U: 140.9 mg/dL
Microalb Creat Ratio: 1.1 mg/g (ref 0.0–30.0)
Microalb, Ur: 1.6 mg/dL (ref 0.0–1.9)

## 2020-03-06 LAB — HEMOGLOBIN A1C: Hgb A1c MFr Bld: 5.9 % (ref 4.6–6.5)

## 2020-04-11 ENCOUNTER — Other Ambulatory Visit: Payer: Self-pay | Admitting: Internal Medicine

## 2020-04-11 ENCOUNTER — Other Ambulatory Visit: Payer: Self-pay

## 2020-04-11 ENCOUNTER — Ambulatory Visit: Payer: BC Managed Care – PPO | Attending: Internal Medicine

## 2020-04-11 ENCOUNTER — Telehealth (INDEPENDENT_AMBULATORY_CARE_PROVIDER_SITE_OTHER): Payer: BC Managed Care – PPO | Admitting: Internal Medicine

## 2020-04-11 DIAGNOSIS — J329 Chronic sinusitis, unspecified: Secondary | ICD-10-CM | POA: Insufficient documentation

## 2020-04-11 DIAGNOSIS — J069 Acute upper respiratory infection, unspecified: Secondary | ICD-10-CM | POA: Insufficient documentation

## 2020-04-11 DIAGNOSIS — Z20822 Contact with and (suspected) exposure to covid-19: Secondary | ICD-10-CM

## 2020-04-11 DIAGNOSIS — J0110 Acute frontal sinusitis, unspecified: Secondary | ICD-10-CM

## 2020-04-11 MED ORDER — ALBUTEROL SULFATE HFA 108 (90 BASE) MCG/ACT IN AERS: 2.0000 | INHALATION_SPRAY | Freq: Four times a day (QID) | RESPIRATORY_TRACT | 11 refills | Status: DC | PRN

## 2020-04-11 MED ORDER — HYDROCOD POLST-CPM POLST ER 10-8 MG/5ML PO SUER: 5.0000 mL | Freq: Every evening | ORAL | 0 refills | Status: DC | PRN

## 2020-04-11 MED ORDER — AMOXICILLIN-POT CLAVULANATE 875-125 MG PO TABS: 1.0000 | ORAL_TABLET | Freq: Two times a day (BID) | ORAL | 0 refills | Status: DC

## 2020-04-11 MED ORDER — PREDNISONE 10 MG PO TABS: ORAL_TABLET | ORAL | 0 refills | Status: DC

## 2020-04-11 NOTE — Assessment & Plan Note (Signed)
Treating as bacterial given history,  But advised to get COVID TESTED. A  PREDNISONE AUGMENTIN ALBUTEROL TUSSIONE

## 2020-04-11 NOTE — Progress Notes (Signed)
Virtual Visit via [place] Note  This visit type was conducted due to national recommendations for restrictions regarding the COVID-19 pandemic (e.g. social distancing).  This format is felt to be most appropriate for this patient at this time.  All issues noted in this document were discussed and addressed.  No physical exam was performed (except for noted visual exam findings with Video Visits).   I connected with@ on 04/11/20 at  9:45 AM EDT by a video enabled telemedicine application and verified that I am speaking with the correct person using two identifiers. Location patient: home Location provider: work or home office Persons participating in the virtual visit: patient, provider  I discussed the limitations, risks, security and privacy concerns of performing an evaluation and management service by telephone and the availability of in person appointments. I also discussed with the patient that there may be a patient responsible charge related to this service. The patient expressed understanding and agreed to proceed.  Reason for visit: symptoms of sinusitis   HPI:  Cut the grass on Monday.  Developed sneezing,  Stuffiness  Did not respond to antihistamine.  On Tuesday developed sinus congestion    Post nasal drip. Now with cough,  Mild dyspnea with climbing steps. Wife has heard wheezing  COVID vaccinated one month ago.    ROS: See pertinent positives and negatives per HPI.  Past Medical History:  Diagnosis Date  . Complication of anesthesia    with tonsillectomy sever swelling of throat, possible laryngiospasm.  Marland Kitchen GERD (gastroesophageal reflux disease)   . Hyperlipidemia   . Hypertension     Past Surgical History:  Procedure Laterality Date  . CHOLESTEATOMA EXCISION Left 2000   left ear  . COLONOSCOPY WITH PROPOFOL N/A 11/17/2017   Procedure: COLONOSCOPY WITH PROPOFOL;  Surgeon: Lin Landsman, MD;  Location: [facility] ENDOSCOPY;  Service: Gastroenterology;  Laterality:  N/A;  . HERNIA REPAIR    . KNEE ARTHROSCOPY WITH MEDIAL MENISECTOMY Left 07/01/2016   Procedure: left knee arthroscopy with partial medial menisectomy;  Surgeon: Hessie Knows, MD;  Location: [facility] ORS;  Service: Orthopedics;  Laterality: Left;  . TONSILLECTOMY AND ADENOIDECTOMY    . UMBILICAL HERNIA REPAIR N/A 08/20/2019   Procedure: LAPAROSCOPIC ASSISTED UMBILICAL HERNIA REPAIR WITH MESH;  Surgeon: Johnathan Hausen, MD;  Location: [facility] ORS;  Service: General;  Laterality: N/A;    Family History  Problem Relation Age of Onset  . Diabetes Mother   . Hypertension Mother   . Uterine cancer Mother   . Aortic aneurysm Father 64  . Heart disease Father 58  . AAA (abdominal aortic aneurysm) Father     SOCIAL HX:  reports that he has never smoked. He has never used smokeless tobacco. He reports current alcohol use. He reports that he does not use drugs.   Current Outpatient Medications:  .  albuterol (VENTOLIN HFA) 108 (90 Base) MCG/ACT inhaler, Inhale 2 puffs into the lungs every 6 (six) hours as needed for wheezing or shortness of breath., Disp: 3.7 g, Rfl: 11 .  amoxicillin-clavulanate (AUGMENTIN) 875-125 MG tablet, Take 1 tablet by mouth 2 (two) times daily., Disp: 14 tablet, Rfl: 0 .  aspirin 81 MG tablet, Take 81 mg by mouth at bedtime. , Disp: , Rfl:  .  blood glucose meter kit and supplies, Use to check blood sugar once daily., Disp: 1 each, Rfl: 0 .  chlorpheniramine-HYDROcodone (TUSSIONEX PENNKINETIC ER) 10-8 MG/5ML SUER, Take 5 mLs by mouth at bedtime as needed., Disp: 140 mL, Rfl: 0 .  fenofibrate 160 MG tablet, TAKE 1 TABLET DAILY, Disp: 90 tablet, Rfl: 3 .  HYDROcodone-acetaminophen (NORCO/VICODIN) 5-325 MG tablet, Take 1 tablet by mouth every 6 (six) hours as needed for moderate pain., Disp: 15 tablet, Rfl: 0 .  metFORMIN (GLUCOPHAGE) 500 MG tablet, Take 1 tablet (500 mg total) by mouth 2 (two) times daily with a meal., Disp: 180 tablet, Rfl: 3 .  montelukast (SINGULAIR) 10 MG  tablet, TAKE 1 TABLET BY MOUTH AT BEDTIME, Disp: 90 tablet, Rfl: 0 .  pantoprazole (PROTONIX) 40 MG tablet, Take 1 tablet by mouth once daily, Disp: 90 tablet, Rfl: 0 .  predniSONE (DELTASONE) 10 MG tablet, 6 tablets on Day 1 , then reduce by 1 tablet daily until gone, Disp: 21 tablet, Rfl: 0 .  sildenafil (REVATIO) 20 MG tablet, TAKE 1 TABLET BY MOUTH THREE TIMES DAILY, Disp: 90 tablet, Rfl: 0 .  simvastatin (ZOCOR) 20 MG tablet, TAKE 1 TABLET DAILY AT BEDTIME, Disp: 90 tablet, Rfl: 3 .  telmisartan-hydrochlorothiazide (MICARDIS HCT) 80-25 MG tablet, Take 1 tablet by mouth daily., Disp: 90 tablet, Rfl: 1 .  traZODone (DESYREL) 100 MG tablet, TAKE 1 TABLET AT BEDTIME AS NEEDED FOR SLEEP, Disp: 90 tablet, Rfl: 3  EXAM:  VITALS per patient if applicable:  GENERAL: alert, oriented, appears well and in no acute distress  HEENT: atraumatic, conjunttiva clear, no obvious abnormalities on inspection of external nose and ears  NECK: normal movements of the head and neck  LUNGS: on inspection no signs of respiratory distress, breathing rate appears normal, no obvious gross SOB, gasping or wheezing  CV: no obvious cyanosis  MS: moves all visible extremities without noticeable abnormality  PSYCH/NEURO: pleasant and cooperative, no obvious depression or anxiety, speech and thought processing grossly intact  ASSESSMENT AND PLAN:  Discussed the following assessment and plan:  Acute frontal sinusitis, recurrence not specified  Sinusitis Treating as bacterial given history,  But advised to get COVID TESTED. A  PREDNISONE AUGMENTIN ALBUTEROL TUSSIONE    I discussed the assessment and treatment plan with the patient. The patient was provided an opportunity to ask questions and all were answered. The patient agreed with the plan and demonstrated an understanding of the instructions.   The patient was advised to call back or seek an in-person evaluation if the symptoms worsen or if the  condition fails to improve as anticipated.  I provided  25 minutes of non-face-to-face time during this encounter reviewing patient's current problems and past procedures/imaging studies, providing counseling on the above mentioned problems , and coordination  of care . Crecencio Mc, MD

## 2020-04-12 LAB — NOVEL CORONAVIRUS, NAA: SARS-CoV-2, NAA: NOT DETECTED

## 2020-04-12 LAB — SARS-COV-2, NAA 2 DAY TAT

## 2020-04-30 ENCOUNTER — Other Ambulatory Visit: Payer: Self-pay | Admitting: Internal Medicine

## 2020-05-01 ENCOUNTER — Other Ambulatory Visit: Payer: Self-pay | Admitting: Internal Medicine

## 2020-05-26 LAB — BASIC METABOLIC PANEL
BUN: 21 (ref 4–21)
Chloride: 101 (ref 99–108)
Creatinine: 1.2 (ref 0.6–1.3)
Glucose: 156
Potassium: 3.9 (ref 3.4–5.3)
Sodium: 138 (ref 137–147)

## 2020-05-26 LAB — COMPREHENSIVE METABOLIC PANEL
Albumin: 4.5 (ref 3.5–5.0)
Calcium: 9.7 (ref 8.7–10.7)
GFR calc Af Amer: 78
GFR calc non Af Amer: 67
Globulin: 2.3

## 2020-05-26 LAB — CBC AND DIFFERENTIAL
HCT: 42 (ref 41–53)
Hemoglobin: 15 (ref 13.5–17.5)
Neutrophils Absolute: 3
Platelets: 246 (ref 150–399)
WBC: 5.8

## 2020-05-26 LAB — HEPATIC FUNCTION PANEL
ALT: 32 (ref 10–40)
AST: 24 (ref 14–40)
Alkaline Phosphatase: 44 (ref 25–125)
Bilirubin, Total: 0.2

## 2020-05-26 LAB — TSH: TSH: 3.3 (ref 0.41–5.90)

## 2020-05-26 LAB — PSA: PSA: 0.7

## 2020-05-26 LAB — CBC: RBC: 4.79 (ref 3.87–5.11)

## 2020-05-26 LAB — IRON,TIBC AND FERRITIN PANEL: Iron: 124

## 2020-05-27 LAB — LIPID PANEL
Cholesterol: 130 (ref 0–200)
HDL: 28 — AB (ref 35–70)
LDL Cholesterol: 65
Triglycerides: 226 — AB (ref 40–160)

## 2020-05-28 ENCOUNTER — Telehealth: Payer: Self-pay | Admitting: Internal Medicine

## 2020-05-28 NOTE — Telephone Encounter (Signed)
Placed in yellow results folder.  °

## 2020-05-28 NOTE — Telephone Encounter (Signed)
Pt's wife dropped off lab results. Placed in folder up front

## 2020-06-12 ENCOUNTER — Other Ambulatory Visit: Payer: Self-pay

## 2020-06-14 ENCOUNTER — Other Ambulatory Visit: Payer: Self-pay | Admitting: Internal Medicine

## 2020-06-26 ENCOUNTER — Other Ambulatory Visit: Payer: Self-pay | Admitting: Internal Medicine

## 2020-07-09 ENCOUNTER — Encounter: Payer: Self-pay | Admitting: Internal Medicine

## 2020-07-09 ENCOUNTER — Telehealth (INDEPENDENT_AMBULATORY_CARE_PROVIDER_SITE_OTHER): Payer: BC Managed Care – PPO | Admitting: Internal Medicine

## 2020-07-09 VITALS — BP 120/72 | Ht 67.0 in | Wt 217.0 lb

## 2020-07-09 DIAGNOSIS — R0683 Snoring: Secondary | ICD-10-CM

## 2020-07-09 DIAGNOSIS — E6609 Other obesity due to excess calories: Secondary | ICD-10-CM | POA: Diagnosis not present

## 2020-07-09 DIAGNOSIS — Z6833 Body mass index (BMI) 33.0-33.9, adult: Secondary | ICD-10-CM

## 2020-07-09 DIAGNOSIS — R0681 Apnea, not elsewhere classified: Secondary | ICD-10-CM

## 2020-07-09 MED ORDER — PNEUMOCOCCAL VAC POLYVALENT 25 MCG/0.5ML IJ INJ: 0.5000 mL | INJECTION | INTRAMUSCULAR | 0 refills | Status: AC

## 2020-07-09 NOTE — Progress Notes (Signed)
Virtual Visit via [place]  This visit type was conducted due to national recommendations for restrictions regarding the COVID-19 pandemic (e.g. social distancing).  This format is felt to be most appropriate for this patient at this time.  All issues noted in this document were discussed and addressed.  No physical exam was performed (except for noted visual exam findings with Video Visits).   I connected with@ on 07/09/20 at  2:30 PM EDT by a video enabled telemedicine application and verified that I am speaking with the correct person using two identifiers. Location patient: home Location provider: work or home office Persons participating in the virtual visit: patient, provider  I discussed the limitations, risks, security and privacy concerns of performing an evaluation and management service by telephone and the availability of in person appointments. I also discussed with the patient that there may be a patient responsible charge related to this service. The patient expressed understanding and agreed to proceed.   Reason for visit: witnessed apnea  HPI:  54 yr old male with type 2 DM, obesity and hypertension presents for evaluation of recent apneic episodes while sleeping that were reported by his wife. Patient states that on other occasions he has woken up gasping for air .  He has a long history of snoring but has no prior history of apnea.  He denies daytime sleepiness, however, and feels relatively rested most mornings.    ROS: See pertinent positives and negatives per HPI.  Past Medical History:  Diagnosis Date  . Complication of anesthesia    with tonsillectomy sever swelling of throat, possible laryngiospasm.  Marland Kitchen GERD (gastroesophageal reflux disease)   . Hyperlipidemia   . Hypertension     Past Surgical History:  Procedure Laterality Date  . CHOLESTEATOMA EXCISION Left 2000   left ear  . COLONOSCOPY WITH PROPOFOL N/A 11/17/2017   Procedure: COLONOSCOPY WITH PROPOFOL;   Surgeon: Lin Landsman, MD;  Location: [facility] ENDOSCOPY;  Service: Gastroenterology;  Laterality: N/A;  . HERNIA REPAIR    . KNEE ARTHROSCOPY WITH MEDIAL MENISECTOMY Left 07/01/2016   Procedure: left knee arthroscopy with partial medial menisectomy;  Surgeon: Hessie Knows, MD;  Location: [facility] ORS;  Service: Orthopedics;  Laterality: Left;  . TONSILLECTOMY AND ADENOIDECTOMY    . UMBILICAL HERNIA REPAIR N/A 08/20/2019   Procedure: LAPAROSCOPIC ASSISTED UMBILICAL HERNIA REPAIR WITH MESH;  Surgeon: Johnathan Hausen, MD;  Location: [facility] ORS;  Service: General;  Laterality: N/A;    Family History  Problem Relation Age of Onset  . Diabetes Mother   . Hypertension Mother   . Uterine cancer Mother   . Aortic aneurysm Father 10  . Heart disease Father 73  . AAA (abdominal aortic aneurysm) Father     SOCIAL HX: reports that he has never smoked. He has never used smokeless tobacco. He reports current alcohol use. He reports that he does not use drugs.   Current Outpatient Medications:  .  albuterol (VENTOLIN HFA) 108 (90 Base) MCG/ACT inhaler, Inhale 2 puffs into the lungs every 6 (six) hours as needed for wheezing or shortness of breath., Disp: 3.7 g, Rfl: 11 .  aspirin 81 MG tablet, Take 81 mg by mouth at bedtime. , Disp: , Rfl:  .  blood glucose meter kit and supplies, Use to check blood sugar once daily., Disp: 1 each, Rfl: 0 .  fenofibrate 160 MG tablet, TAKE 1 TABLET DAILY, Disp: 90 tablet, Rfl: 3 .  metFORMIN (GLUCOPHAGE) 500 MG tablet, Take 1 tablet (500 mg  total) by mouth 2 (two) times daily with a meal., Disp: 180 tablet, Rfl: 3 .  montelukast (SINGULAIR) 10 MG tablet, TAKE 1 TABLET AT BEDTIME, Disp: 90 tablet, Rfl: 3 .  pantoprazole (PROTONIX) 40 MG tablet, Take 1 tablet by mouth once daily, Disp: 90 tablet, Rfl: 1 .  sildenafil (REVATIO) 20 MG tablet, TAKE 1 TABLET BY MOUTH THREE TIMES DAILY, Disp: 90 tablet, Rfl: 0 .  simvastatin (ZOCOR) 20 MG tablet, TAKE 1 TABLET DAILY AT BEDTIME,  Disp: 90 tablet, Rfl: 3 .  telmisartan-hydrochlorothiazide (MICARDIS HCT) 80-25 MG tablet, TAKE 1 TABLET DAILY, Disp: 90 tablet, Rfl: 3 .  traZODone (DESYREL) 100 MG tablet, TAKE 1 TABLET AT BEDTIME AS NEEDED FOR SLEEP, Disp: 90 tablet, Rfl: 3  EXAM:  VITALS per patient if applicable:  GENERAL: alert, oriented, appears well and in no acute distress  HEENT: atraumatic, conjunttiva clear, no obvious abnormalities on inspection of external nose and ears  NECK: large neck girth, ormal movements of the head and neck  LUNGS: on inspection no signs of respiratory distress, breathing rate appears normal, no obvious gross SOB, gasping or wheezing  CV: no obvious cyanosis  MS: moves all visible extremities without noticeable abnormality  PSYCH/NEURO: pleasant and cooperative, no obvious depression or anxiety, speech and thought processing grossly intact  ASSESSMENT AND PLAN:  Discussed the following assessment and plan:  Snoring - Plan: Home sleep test  Witnessed apneic spells - Plan: Home sleep test  Class 1 obesity due to excess calories without serious comorbidity with body mass index (BMI) of 33.0 to 33.9 in adult  Witnessed apneic spells With snoring and gasping reported.  He does not take any medications that would be causing this.  Sleep study ordered to rule out OSA  Obesity I have congratulated him in reduction of   BMI and encouraged  Continued weight loss with goal of 10% of body weigh over the next 6 months using a low glycemic index diet and regular exercise a minimum of 5 days per week.      I discussed the assessment and treatment plan with the patient. The patient was provided an opportunity to ask questions and all were answered. The patient agreed with the plan and demonstrated an understanding of the instructions.   The patient was advised to call back or seek an in-person evaluation if the symptoms worsen or if the condition fails to improve as anticipated.  I  provided 30 minutes of non-face-to-face time during this encounter.   Christopher Mc, MD

## 2020-07-12 DIAGNOSIS — R0681 Apnea, not elsewhere classified: Secondary | ICD-10-CM | POA: Insufficient documentation

## 2020-07-12 NOTE — Assessment & Plan Note (Signed)
I have congratulated him in reduction of   BMI and encouraged  Continued weight loss with goal of 10% of body weigh over the next 6 months using a low glycemic index diet and regular exercise a minimum of 5 days per week.   

## 2020-07-12 NOTE — Assessment & Plan Note (Signed)
With snoring and gasping reported.  He does not take any medications that would be causing this.  Sleep study ordered to rule out OSA

## 2020-07-22 ENCOUNTER — Other Ambulatory Visit: Payer: Self-pay

## 2020-07-22 ENCOUNTER — Ambulatory Visit
Admission: EM | Admit: 2020-07-22 | Discharge: 2020-07-22 | Disposition: A | Discharge status: Home / Self Care | Payer: BC Managed Care – PPO | Attending: Family Medicine | Admitting: Family Medicine

## 2020-07-22 DIAGNOSIS — R0981 Nasal congestion: Secondary | ICD-10-CM

## 2020-07-22 DIAGNOSIS — J3089 Other allergic rhinitis: Secondary | ICD-10-CM

## 2020-07-22 DIAGNOSIS — I889 Nonspecific lymphadenitis, unspecified: Secondary | ICD-10-CM

## 2020-07-22 MED ORDER — FLUTICASONE PROPIONATE 50 MCG/ACT NA SUSP
2.0000 | Freq: Every day | NASAL | 0 refills | Status: DC
Start: 2020-07-22 — End: 2020-09-04

## 2020-07-22 NOTE — ED Triage Notes (Signed)
Pt is here with an abscess under his right arm that started 4 days ago after getting a shot in the same arm a week ago, pt states his sinues stated Sunday and states theres sinus pressure as well.

## 2020-07-22 NOTE — Discharge Instructions (Signed)
I think that the swelling under your arm is part of an immune response to your vaccine from last week  I have sent in flonase for you to use 2 sprays per nostril daily as needed for congestion  Follow up with primary care or with this office as needed  Follow up with the ER for trouble swallowing, trouble breathing, other concerning symptoms

## 2020-07-22 NOTE — ED Provider Notes (Signed)
[facility]   [id number] 07/22/20 Arrival Time: 0957  CH:YIFO THROAT  SUBJECTIVE: History from: patient.  Christopher Vazquez is a 54 y.o. male who presents with abrupt onset of nasal congestion, headache, fatigue for the last 3 days. Denies sick exposure to Covid, strep, flu or mono, or precipitating event. Has negative history of Covid. Has had Covid vaccines. Has not attempted OTC treatment. There are no aggravating symptoms. Denies previous symptoms in the past. Also reports that the R underarm is swollen for the last 3 days. Reports that the area is tender. Reports that the had a pneumonia vaccine in the R arm last week.  Denies fever, chills, ear pain, sinus pain, cough, SOB, wheezing, chest pain, nausea, rash, changes in bowel or bladder habits.     ROS: As per HPI.  All other pertinent ROS negative.     Past Medical History:  Diagnosis Date  . Complication of anesthesia    with tonsillectomy sever swelling of throat, possible laryngiospasm.  Marland Kitchen GERD (gastroesophageal reflux disease)   . Hyperlipidemia   . Hypertension    Past Surgical History:  Procedure Laterality Date  . CHOLESTEATOMA EXCISION Left 2000   left ear  . COLONOSCOPY WITH PROPOFOL N/A 11/17/2017   Procedure: COLONOSCOPY WITH PROPOFOL;  Surgeon: Lin Landsman, MD;  Location: [facility] ENDOSCOPY;  Service: Gastroenterology;  Laterality: N/A;  . HERNIA REPAIR    . KNEE ARTHROSCOPY WITH MEDIAL MENISECTOMY Left 07/01/2016   Procedure: left knee arthroscopy with partial medial menisectomy;  Surgeon: Hessie Knows, MD;  Location: [facility] ORS;  Service: Orthopedics;  Laterality: Left;  . TONSILLECTOMY AND ADENOIDECTOMY    . UMBILICAL HERNIA REPAIR N/A 08/20/2019   Procedure: LAPAROSCOPIC ASSISTED UMBILICAL HERNIA REPAIR WITH MESH;  Surgeon: Johnathan Hausen, MD;  Location: [facility] ORS;  Service: General;  Laterality: N/A;   Allergies  Allergen Reactions  . Scopolamine Other (See Comments)    vision  . Shellfish  Allergy     throat starts to become scratchy, denies difficulty breathing or swelling of mouth, lips or throat. Denies problems with betadine topically.  . Niacin And Related Rash    Pens and needle sensation   No current facility-administered medications on file prior to encounter.   Current Outpatient Medications on File Prior to Encounter  Medication Sig Dispense Refill  . albuterol (VENTOLIN HFA) 108 (90 Base) MCG/ACT inhaler Inhale 2 puffs into the lungs every 6 (six) hours as needed for wheezing or shortness of breath. 3.7 g 11  . aspirin 81 MG tablet Take 81 mg by mouth at bedtime.     . blood glucose meter kit and supplies Use to check blood sugar once daily. 1 each 0  . fenofibrate 160 MG tablet TAKE 1 TABLET DAILY 90 tablet 3  . metFORMIN (GLUCOPHAGE) 500 MG tablet Take 1 tablet (500 mg total) by mouth 2 (two) times daily with a meal. 180 tablet 3  . montelukast (SINGULAIR) 10 MG tablet TAKE 1 TABLET AT BEDTIME 90 tablet 3  . pantoprazole (PROTONIX) 40 MG tablet Take 1 tablet by mouth once daily 90 tablet 1  . sildenafil (REVATIO) 20 MG tablet TAKE 1 TABLET BY MOUTH THREE TIMES DAILY 90 tablet 0  . simvastatin (ZOCOR) 20 MG tablet TAKE 1 TABLET DAILY AT BEDTIME 90 tablet 3  . telmisartan-hydrochlorothiazide (MICARDIS HCT) 80-25 MG tablet TAKE 1 TABLET DAILY 90 tablet 3  . traZODone (DESYREL) 100 MG tablet TAKE 1 TABLET AT BEDTIME AS NEEDED FOR SLEEP 90 tablet  3   Social History   Socioeconomic History  . Marital status: Married    Spouse name: Not on file  . Number of children: 3  . Years of education: Not on file  . Highest education level: Not on file  Occupational History  . Occupation: Buyer, retail: [employer]  Tobacco Use  . Smoking status: Never Smoker  . Smokeless tobacco: Never Used  Vaping Use  . Vaping Use: Never used  Substance and Sexual Activity  . Alcohol use: Yes  . Drug use: No  . Sexual activity: Yes    Birth control/protection: None     Comment: Married  Other Topics Concern  . Not on file  Social History Narrative  . Not on file   Social Determinants of Health   Financial Resource Strain:   . Difficulty of Paying Living Expenses: Not on file  Food Insecurity:   . Worried About Charity fundraiser in the Last Year: Not on file  . Vazquez Out of Food in the Last Year: Not on file  Transportation Needs:   . Lack of Transportation (Medical): Not on file  . Lack of Transportation (Non-Medical): Not on file  Physical Activity:   . Days of Exercise per Week: Not on file  . Minutes of Exercise per Session: Not on file  Stress:   . Feeling of Stress : Not on file  Social Connections:   . Frequency of Communication with Friends and Family: Not on file  . Frequency of Social Gatherings with Friends and Family: Not on file  . Attends Religious Services: Not on file  . Active Member of Clubs or Organizations: Not on file  . Attends Archivist Meetings: Not on file  . Marital Status: Not on file  Intimate Partner Violence:   . Fear of Current or Ex-Partner: Not on file  . Emotionally Abused: Not on file  . Physically Abused: Not on file  . Sexually Abused: Not on file   Family History  Problem Relation Age of Onset  . Diabetes Mother   . Hypertension Mother   . Uterine cancer Mother   . Aortic aneurysm Father 37  . Heart disease Father 47  . AAA (abdominal aortic aneurysm) Father     OBJECTIVE:  Vitals:   07/22/20 1037  BP: 118/72  Pulse: 75  Resp: 18  Temp: 98.7 F (37.1 C)  TempSrc: Oral  SpO2: 95%     General appearance: alert; appears fatigued, but nontoxic, speaking in full sentences and managing own secretions HEENT: NCAT; Ears: EACs clear, TMs pearly gray with visible cone of light, without erythema; Eyes: PERRL, EOMI grossly; Nose: no obvious rhinorrhea; Throat: oropharynx clear, tonsils 1+ and mildly erythematous without white tonsillar exudates, uvula midline; Sinuses: nontender to  palpation Neck: supple without LAD Lungs: CTA bilaterally without adventitious breath sounds; cough absent Heart: regular rate and rhythm.  Radial pulses 2+ symmetrical bilaterally Skin: warm and dry Psychological: alert and cooperative; normal mood and affect  LABS: No results found for this or any previous visit (from the past 24 hour(s)).   ASSESSMENT & PLAN:  1. Allergic rhinitis due to other allergic trigger, unspecified seasonality   2. Lymphadenitis   3. Nasal congestion     Meds ordered this encounter  Medications  . fluticasone (FLONASE) 50 MCG/ACT nasal spray    Sig: Place 2 sprays into both nostrils daily for 14 days.    Dispense:  9.9 mL  Refill:  0    Order Specific Question:   Supervising Provider    Answer:   Chase Picket [[id number]]    Prescribed flonase Drink warm or cool liquids, use throat lozenges, or popsicles to help alleviate symptoms Take OTC ibuprofen or tylenol as needed for pain May use Zyrtec D and flonase to help alleviate symptoms Lymph node swelling to R axilla likely due to immune response from vaccine Follow up with PCP if symptoms persist Return or go to ER if you have any new or worsening symptoms such as fever, chills, nausea, vomiting, worsening sore throat, cough, abdominal pain, chest pain, changes in bowel or bladder habits.   Reviewed expectations re: course of current medical issues. Questions answered. Outlined signs and symptoms indicating need for more acute intervention. Patient verbalized understanding. After Visit Summary given.          Faustino Congress, NP 07/22/20 1300

## 2020-07-23 ENCOUNTER — Telehealth: Payer: Self-pay

## 2020-07-23 DIAGNOSIS — R0683 Snoring: Secondary | ICD-10-CM

## 2020-07-23 DIAGNOSIS — R0681 Apnea, not elsewhere classified: Secondary | ICD-10-CM

## 2020-07-23 NOTE — Telephone Encounter (Signed)
Pt sent a mychart message stating that he has not heard anything about getting the home sleep study scheduled. Just wanting to follow up on this.

## 2020-07-23 NOTE — Telephone Encounter (Signed)
It shows in pt chart that the home sleep test was completed on 07/09/2020. I don't see a referral or anything else to sch. Please advise and Thank you!

## 2020-07-24 NOTE — Telephone Encounter (Signed)
Good afternoon!  The order is not under active requests it's showing as a finalized request as if it was completed. Order it like when you put in a etc: mammo or bone density. Please advise and Thank you!

## 2020-07-24 NOTE — Telephone Encounter (Signed)
That is just an order. Pt has not had the home sleep study done yet.

## 2020-07-24 NOTE — Telephone Encounter (Signed)
OK, I HAVE REORDERED IT

## 2020-07-24 NOTE — Addendum Note (Signed)
Addended by: Crecencio Mc on: 07/24/2020 05:43 PM   Modules accepted: Orders

## 2020-07-24 NOTE — Telephone Encounter (Signed)
Sent pt a mychart message to clarify if he has done the sleep study or not.

## 2020-07-24 NOTE — Telephone Encounter (Signed)
Christopher Vazquez,  Please order the home sleep study that I Ordered on Sept 1.   If it has to be ordered a different way pleas let ALL PROVIDERS KNOW how to do this  [facility]" AND THIS IS WHAT COMES UP   THANKS!

## 2020-07-25 NOTE — Telephone Encounter (Signed)
Got it!     Thanks =).

## 2020-07-31 ENCOUNTER — Encounter: Payer: Self-pay | Admitting: Internal Medicine

## 2020-07-31 LAB — PULMONARY FUNCTION TEST

## 2020-08-05 ENCOUNTER — Other Ambulatory Visit: Payer: Self-pay

## 2020-08-05 MED ORDER — METFORMIN HCL 500 MG PO TABS: 500.0000 mg | ORAL_TABLET | Freq: Two times a day (BID) | ORAL | 1 refills | Status: DC

## 2020-08-07 ENCOUNTER — Ambulatory Visit
Admission: EM | Admit: 2020-08-07 | Discharge: 2020-08-07 | Disposition: A | Discharge status: Home / Self Care | Payer: BC Managed Care – PPO

## 2020-08-07 ENCOUNTER — Ambulatory Visit: Payer: Self-pay

## 2020-08-07 ENCOUNTER — Telehealth: Payer: Self-pay | Admitting: Internal Medicine

## 2020-08-07 DIAGNOSIS — B349 Viral infection, unspecified: Secondary | ICD-10-CM | POA: Diagnosis not present

## 2020-08-07 DIAGNOSIS — G4733 Obstructive sleep apnea (adult) (pediatric): Secondary | ICD-10-CM | POA: Insufficient documentation

## 2020-08-07 DIAGNOSIS — Z03818 Encounter for observation for suspected exposure to other biological agents ruled out: Secondary | ICD-10-CM | POA: Diagnosis not present

## 2020-08-07 DIAGNOSIS — Z1152 Encounter for screening for COVID-19: Secondary | ICD-10-CM | POA: Diagnosis not present

## 2020-08-07 NOTE — ED Triage Notes (Signed)
Patient c/o headache, sore throat, nasal and chest congestion, productive cough, body aches, and chills x2 days. Reports he did a PCR COVID test at [facility] before coming to [facility].

## 2020-08-07 NOTE — Telephone Encounter (Signed)
Order has been faxed

## 2020-08-07 NOTE — Telephone Encounter (Signed)
Jeneen Rinks with [facility] came to drop off sleep therapy order form-Placed in folder up front to be completed

## 2020-08-07 NOTE — ED Provider Notes (Signed)
Christopher Vazquez    CSN: [id number] Arrival date & time: 08/07/20  1309      History   Chief Complaint Chief Complaint  Patient presents with  . Headache  . Nasal Congestion  . Generalized Body Aches  . Sore Throat  . Cough  . Chest Congestion    HPI Christopher Vazquez is a 54 y.o. male.   Patient presents with headache x4 days.  He also reports 2-day history of sore throat, nasal congestion, chest congestion, cough productive of clear phlegm, body aches, chills.  He denies fever, rash, shortness of breath, vomiting, diarrhea, or other symptoms.  Treatment attempted at home with Alka-Seltzer cold medication.  Patient was seen here on 07/22/2020 by NP Zigmund Daniel; diagnosed with allergic rhinitis, lymphadenitis, nasal congestion.  Treated with Flonase, Zyrtec, ibuprofen.  Patient's medical history includes hypertension, diabetes, hyperlipidemia, obesity, OSA, GERD.  The history is provided by the patient.    Past Medical History:  Diagnosis Date  . Complication of anesthesia    with tonsillectomy sever swelling of throat, possible laryngiospasm.  Marland Kitchen GERD (gastroesophageal reflux disease)   . Hyperlipidemia   . Hypertension     Patient Active Problem List   Diagnosis Date Noted  . OSA (obstructive sleep apnea) 08/07/2020  . Witnessed apneic spells 07/12/2020  . Special screening for malignant neoplasms, colon   . Encounter for preventive health examination 10/23/2017  . Vasomotor flushing 02/10/2016  . Musculoskeletal pain 02/04/2016  . Newly diagnosed diabetes ([place]) 08/18/2015  . Obesity 02/09/2015  . Vitamin D deficiency 02/19/2014  . Seasonal allergic rhinitis 02/02/2014  . Dysphagia, unspecified(787.20) 03/07/2013  . Hepatic steatosis 03/07/2013  . Polyarthritis 01/07/2012  . Hyperlipidemia   . Essential hypertension     Past Surgical History:  Procedure Laterality Date  . CHOLESTEATOMA EXCISION Left 2000   left ear  . COLONOSCOPY WITH PROPOFOL N/A 11/17/2017     Procedure: COLONOSCOPY WITH PROPOFOL;  Surgeon: Lin Landsman, MD;  Location: [facility] ENDOSCOPY;  Service: Gastroenterology;  Laterality: N/A;  . HERNIA REPAIR    . KNEE ARTHROSCOPY WITH MEDIAL MENISECTOMY Left 07/01/2016   Procedure: left knee arthroscopy with partial medial menisectomy;  Surgeon: Hessie Knows, MD;  Location: [facility] ORS;  Service: Orthopedics;  Laterality: Left;  . TONSILLECTOMY AND ADENOIDECTOMY    . UMBILICAL HERNIA REPAIR N/A 08/20/2019   Procedure: LAPAROSCOPIC ASSISTED UMBILICAL HERNIA REPAIR WITH MESH;  Surgeon: Johnathan Hausen, MD;  Location: [facility] ORS;  Service: General;  Laterality: N/A;       Home Medications    Prior to Admission medications   Medication Sig Start Date End Date Taking? Authorizing Provider  aspirin 81 MG tablet Take 81 mg by mouth at bedtime.    Yes [provider]  blood glucose meter kit and supplies Use to check blood sugar once daily. 01/01/20  Yes Crecencio Mc, MD  fenofibrate 160 MG tablet TAKE 1 TABLET DAILY 01/23/20  Yes Crecencio Mc, MD  metFORMIN (GLUCOPHAGE) 500 MG tablet Take 1 tablet (500 mg total) by mouth 2 (two) times daily with a meal. 08/05/20  Yes Crecencio Mc, MD  montelukast (SINGULAIR) 10 MG tablet TAKE 1 TABLET AT BEDTIME 05/01/20  Yes Crecencio Mc, MD  sildenafil (REVATIO) 20 MG tablet TAKE 1 TABLET BY MOUTH THREE TIMES DAILY 06/27/20  Yes Crecencio Mc, MD  simvastatin (ZOCOR) 20 MG tablet TAKE 1 TABLET DAILY AT BEDTIME 01/28/20  Yes Crecencio Mc, MD  telmisartan-hydrochlorothiazide (MICARDIS HCT) 80-25  MG tablet TAKE 1 TABLET DAILY 06/16/20  Yes Sherlene Shams, MD  traZODone (DESYREL) 100 MG tablet TAKE 1 TABLET AT BEDTIME AS NEEDED FOR SLEEP 01/23/20  Yes Sherlene Shams, MD  albuterol (VENTOLIN HFA) 108 (90 Base) MCG/ACT inhaler Inhale 2 puffs into the lungs every 6 (six) hours as needed for wheezing or shortness of breath. 04/11/20   Sherlene Shams, MD  fluticasone (FLONASE) 50 MCG/ACT nasal spray  Place 2 sprays into both nostrils daily for 14 days. 07/22/20 08/05/20  Moshe Cipro, NP  pantoprazole (PROTONIX) 40 MG tablet Take 1 tablet by mouth once daily 06/27/20   Sherlene Shams, MD    Family History Family History  Problem Relation Age of Onset  . Diabetes Mother   . Hypertension Mother   . Uterine cancer Mother   . Aortic aneurysm Father 34  . Heart disease Father 16  . AAA (abdominal aortic aneurysm) Father     Social History Social History   Tobacco Use  . Smoking status: Never Smoker  . Smokeless tobacco: Never Used  Vaping Use  . Vaping Use: Never used  Substance Use Topics  . Alcohol use: Yes  . Drug use: No     Allergies   Scopolamine, Shellfish allergy, and Niacin and related   Review of Systems Review of Systems  Constitutional: Positive for chills. Negative for fever.  HENT: Positive for congestion and sore throat. Negative for ear pain.   Eyes: Negative for pain and visual disturbance.  Respiratory: Positive for cough. Negative for shortness of breath.   Cardiovascular: Negative for chest pain and palpitations.  Gastrointestinal: Negative for abdominal pain, diarrhea and vomiting.  Genitourinary: Negative for dysuria and hematuria.  Musculoskeletal: Negative for arthralgias and back pain.  Skin: Negative for color change and rash.  Neurological: Positive for headaches. Negative for seizures and syncope.  All other systems reviewed and are negative.    Physical Exam Triage Vital Signs ED Triage Vitals [08/07/20 1406]  Enc Vitals Group     BP      Pulse      Resp      Temp      Temp src      SpO2      Weight      Height      Head Circumference      Peak Flow      Pain Score 5     Pain Loc      Pain Edu?      Excl. in GC?    No data found.  Updated Vital Signs BP 126/80   Pulse 92   Temp 99 F (37.2 C)   Resp 19   SpO2 94%   Visual Acuity Right Eye Distance:   Left Eye Distance:   Bilateral Distance:    Right  Eye Near:   Left Eye Near:    Bilateral Near:     Physical Exam Vitals and nursing note reviewed.  Constitutional:      General: He is not in acute distress.    Appearance: He is well-developed.  HENT:     Head: Normocephalic and atraumatic.     Right Ear: Tympanic membrane normal.     Left Ear: Tympanic membrane normal.     Nose: Nose normal.     Mouth/Throat:     Mouth: Mucous membranes are moist.     Pharynx: Oropharynx is clear.  Eyes:     Conjunctiva/sclera: Conjunctivae normal.  Cardiovascular:  Rate and Rhythm: Normal rate and regular rhythm.     Heart sounds: No murmur heard.   Pulmonary:     Effort: Pulmonary effort is normal. No respiratory distress.     Breath sounds: Normal breath sounds. No wheezing or rhonchi.  Abdominal:     Palpations: Abdomen is soft.     Tenderness: There is no abdominal tenderness. There is no guarding or rebound.  Musculoskeletal:     Cervical back: Neck supple.  Skin:    General: Skin is warm and dry.     Findings: No rash.  Neurological:     General: No focal deficit present.     Mental Status: He is alert and oriented to person, place, and time.     Gait: Gait normal.  Psychiatric:        Mood and Affect: Mood normal.        Behavior: Behavior normal.      UC Treatments / Results  Labs (all labs ordered are listed, but only abnormal results are displayed) Labs Reviewed - No data to display  EKG   Radiology No results found.  Procedures Procedures (including critical care time)  Medications Ordered in UC Medications - No data to display  Initial Impression / Assessment and Plan / UC Course  I have reviewed the triage vital signs and the nursing notes.  Pertinent labs & imaging results that were available during my care of the patient were reviewed by me and considered in my medical decision making (see chart for details).   Viral illness.  Patient currently has a PCR COVID test pending; done at [facility].   Instructed him to self quarantine until the test result is back.  Discussed symptomatic treatment.  Discussed that he should not take OTC medications that are not meant for people with hypertension; Discussed that he can take plain Mucinex or Coricidin HBP if needed.  Instructed patient to go to the ED if he develops acute worsening symptoms.  Patient agrees to plan of care.  Final Clinical Impressions(s) / UC Diagnoses   Final diagnoses:  Viral illness     Discharge Instructions     You should self quarantine until the COVID test result is back.    Take Tylenol or ibuprofen as needed for fever or discomfort.  Plain Mucinex or Coricidin HBP as needed for your other symptoms.  Rest and keep yourself hydrated.    Go to the emergency department if you develop acute worsening symptoms.        ED Prescriptions    None     PDMP not reviewed this encounter.   Sharion Balloon, NP 08/07/20 1440

## 2020-08-07 NOTE — Discharge Instructions (Signed)
You should self quarantine until the COVID test result is back.    Take Tylenol or ibuprofen as needed for fever or discomfort.  Plain Mucinex or Coricidin HBP as needed for your other symptoms.  Rest and keep yourself hydrated.    Go to the emergency department if you develop acute worsening symptoms.

## 2020-08-07 NOTE — Telephone Encounter (Signed)
Placed in quick sign folder.  

## 2020-08-18 ENCOUNTER — Other Ambulatory Visit: Payer: Self-pay

## 2020-08-18 MED ORDER — METFORMIN HCL 500 MG PO TABS
500.0000 mg | ORAL_TABLET | Freq: Two times a day (BID) | ORAL | 1 refills | Status: DC
Start: 2020-08-18 — End: 2021-06-25

## 2020-08-25 DIAGNOSIS — E782 Mixed hyperlipidemia: Secondary | ICD-10-CM

## 2020-08-25 DIAGNOSIS — R7303 Prediabetes: Secondary | ICD-10-CM

## 2020-08-25 DIAGNOSIS — I10 Essential (primary) hypertension: Secondary | ICD-10-CM

## 2020-08-25 NOTE — Telephone Encounter (Signed)
Pt stated that he is supposed to return for blood work in October. I have ordered A1c, CMP, and lipid panel. Is there anything else that you would like to have ordered?

## 2020-09-04 ENCOUNTER — Encounter: Payer: Self-pay | Admitting: Internal Medicine

## 2020-09-04 ENCOUNTER — Other Ambulatory Visit: Payer: Self-pay

## 2020-09-04 ENCOUNTER — Ambulatory Visit (INDEPENDENT_AMBULATORY_CARE_PROVIDER_SITE_OTHER): Payer: BC Managed Care – PPO | Admitting: Internal Medicine

## 2020-09-04 VITALS — BP 118/84 | HR 78 | Temp 98.0°F | Resp 14 | Ht 67.0 in | Wt 230.0 lb

## 2020-09-04 DIAGNOSIS — E119 Type 2 diabetes mellitus without complications: Secondary | ICD-10-CM

## 2020-09-04 DIAGNOSIS — I77811 Abdominal aortic ectasia: Secondary | ICD-10-CM | POA: Insufficient documentation

## 2020-09-04 DIAGNOSIS — K760 Fatty (change of) liver, not elsewhere classified: Secondary | ICD-10-CM

## 2020-09-04 DIAGNOSIS — Z9229 Personal history of other drug therapy: Secondary | ICD-10-CM

## 2020-09-04 DIAGNOSIS — Z1159 Encounter for screening for other viral diseases: Secondary | ICD-10-CM | POA: Diagnosis not present

## 2020-09-04 DIAGNOSIS — G4733 Obstructive sleep apnea (adult) (pediatric): Secondary | ICD-10-CM

## 2020-09-04 DIAGNOSIS — I10 Essential (primary) hypertension: Secondary | ICD-10-CM | POA: Diagnosis not present

## 2020-09-04 DIAGNOSIS — E782 Mixed hyperlipidemia: Secondary | ICD-10-CM | POA: Diagnosis not present

## 2020-09-04 DIAGNOSIS — R7303 Prediabetes: Secondary | ICD-10-CM

## 2020-09-04 LAB — COMPREHENSIVE METABOLIC PANEL
ALT: 49 U/L (ref 0–53)
AST: 35 U/L (ref 0–37)
Albumin: 4.3 g/dL (ref 3.5–5.2)
Alkaline Phosphatase: 39 U/L (ref 39–117)
BUN: 17 mg/dL (ref 6–23)
CO2: 28 mEq/L (ref 19–32)
Calcium: 9.2 mg/dL (ref 8.4–10.5)
Chloride: 102 mEq/L (ref 96–112)
Creatinine, Ser: 1.39 mg/dL (ref 0.40–1.50)
GFR: 57.9 mL/min — ABNORMAL LOW (ref >60.00)
Glucose, Bld: 150 mg/dL — ABNORMAL HIGH (ref 70–99)
Potassium: 4.2 mEq/L (ref 3.5–5.1)
Sodium: 138 mEq/L (ref 135–145)
Total Bilirubin: 0.4 mg/dL (ref 0.2–1.2)
Total Protein: 6.5 g/dL (ref 6.0–8.3)

## 2020-09-04 LAB — LIPID PANEL
Cholesterol: 126 mg/dL (ref 0–200)
HDL: 27.1 mg/dL — ABNORMAL LOW
NonHDL: 99.32
Total CHOL/HDL Ratio: 5
Triglycerides: 256 mg/dL — ABNORMAL HIGH (ref 0.0–149.0)
VLDL: 51.2 mg/dL — ABNORMAL HIGH (ref 0.0–40.0)

## 2020-09-04 LAB — HEMOGLOBIN A1C: Hgb A1c MFr Bld: 6.6 % — ABNORMAL HIGH (ref 4.6–6.5)

## 2020-09-04 LAB — LDL CHOLESTEROL, DIRECT: Direct LDL: 78 mg/dL

## 2020-09-04 NOTE — Progress Notes (Signed)
Subjective:  Patient ID: Christopher Vazquez, male    DOB: 1966-01-18  Age: 54 y.o. MRN: [id number]  CC: The primary encounter diagnosis was Encounter for hepatitis C screening test for low risk patient. Diagnoses of Ectatic abdominal aorta (HCC), Hepatitis vaccination administered 1-4 years ago, Mixed hyperlipidemia, Essential hypertension, Prediabetes, Hepatic steatosis, Newly diagnosed diabetes ([place]), and OSA (obstructive sleep apnea) were also pertinent to this visit.  HPI Christopher Vazquez presents for FOLLOW UP on T2DM, OSA, and obesity with fatty liver    This visit occurred during the SARS-CoV-2 public health emergency.  Safety protocols were in place, including screening questions prior to the visit, additional usage of staff PPE, and extensive cleaning of exam room while observing appropriate contact time as indicated for disinfecting solutions.   Patient has received both doses of the available COVID 19 vaccine without complications.  Patient continues to mask when outside of the home except when walking in yard or at safe distances from others .  Patient denies any change in mood or development of unhealthy behaviors resuting from the pandemic's restriction of activities and socialization.     DM ;He feels generally well, is exercising several times per week BUT HAS gained  WEIGHT.  He is checking blood sugars once daily at variable times.  BS have been under 130 fasting and < 150 post prandially. Having recurrent hypoglyemic events during the day  Breakfast is a commercial  biscuit  No protein.  Lunch grilled chicken salad (lettuce).  Dinner is bad:  2 Starches with dinner  added to chicken Taking his medications as directed. Following a carbohydrate modified diet 6 days per week. Denies numbness, burning and tingling of extremities. Appetite is good.   Last eye exam:  2 ago  [facility] retinal [facility]  .  No change in contact lens prescription. Weight gain of 7 lbs since January    Outpatient Medications Prior to Visit  Medication Sig Dispense Refill  . aspirin 81 MG tablet Take 81 mg by mouth at bedtime.     . blood glucose meter kit and supplies Use to check blood sugar once daily. 1 each 0  . fenofibrate 160 MG tablet TAKE 1 TABLET DAILY 90 tablet 3  . metFORMIN (GLUCOPHAGE) 500 MG tablet Take 1 tablet (500 mg total) by mouth 2 (two) times daily with a meal. 180 tablet 1  . montelukast (SINGULAIR) 10 MG tablet TAKE 1 TABLET AT BEDTIME 90 tablet 3  . pantoprazole (PROTONIX) 40 MG tablet Take 1 tablet by mouth once daily 90 tablet 1  . sildenafil (REVATIO) 20 MG tablet TAKE 1 TABLET BY MOUTH THREE TIMES DAILY 90 tablet 0  . simvastatin (ZOCOR) 20 MG tablet TAKE 1 TABLET DAILY AT BEDTIME 90 tablet 3  . telmisartan-hydrochlorothiazide (MICARDIS HCT) 80-25 MG tablet TAKE 1 TABLET DAILY 90 tablet 3  . traZODone (DESYREL) 100 MG tablet TAKE 1 TABLET AT BEDTIME AS NEEDED FOR SLEEP 90 tablet 3  . albuterol (VENTOLIN HFA) 108 (90 Base) MCG/ACT inhaler Inhale 2 puffs into the lungs every 6 (six) hours as needed for wheezing or shortness of breath. (Patient not taking: Reported on 09/04/2020) 3.7 g 11  . fluticasone (FLONASE) 50 MCG/ACT nasal spray Place 2 sprays into both nostrils daily for 14 days. 9.9 mL 0   No facility-administered medications prior to visit.    Review of Systems;  Patient denies headache, fevers, malaise, unintentional weight loss, skin rash, eye pain, sinus congestion and  sinus pain, sore throat, dysphagia,  hemoptysis , cough, dyspnea, wheezing, chest pain, palpitations, orthopnea, edema, abdominal pain, nausea, melena, diarrhea, constipation, flank pain, dysuria, hematuria, urinary  Frequency, nocturia, numbness, tingling, seizures,  Focal weakness, Loss of consciousness,  Tremor, insomnia, depression, anxiety, and suicidal ideation.      Objective:  BP 118/84 (BP Location: Left Arm, Patient Position: Sitting, Cuff Size: Large)   Pulse 78   Temp  98 F (36.7 C) (Oral)   Resp 14   Ht $R'5\' 7"'qd$  (1.702 m)   Wt 230 lb (104.3 kg)   SpO2 97%   BMI 36.02 kg/m   BP Readings from Last 3 Encounters:  09/04/20 118/84  08/07/20 126/80  07/22/20 118/72    Wt Readings from Last 3 Encounters:  09/04/20 230 lb (104.3 kg)  07/09/20 217 lb (98.4 kg)  11/30/19 223 lb (101.2 kg)    General appearance: alert, cooperative and appears stated age Ears: normal TM's and external ear canals both ears Throat: lips, mucosa, and tongue normal; teeth and gums normal Neck: no adenopathy, no carotid bruit, supple, symmetrical, trachea midline and thyroid not enlarged, symmetric, no tenderness/mass/nodules Back: symmetric, no curvature. ROM normal. No CVA tenderness. Lungs: clear to auscultation bilaterally Heart: regular rate and rhythm, S1, S2 normal, no murmur, click, rub or gallop Abdomen: soft, non-tender; bowel sounds normal; no masses,  no organomegaly Pulses: 2+ and symmetric Skin: Skin color, texture, turgor normal. No rashes or lesions Lymph nodes: Cervical, supraclavicular, and axillary nodes normal.  Lab Results  Component Value Date   HGBA1C 6.6 (H) 09/04/2020   HGBA1C 5.9 03/06/2020   HGBA1C 7.0 (H) 11/28/2019    Lab Results  Component Value Date   CREATININE 1.39 09/04/2020   CREATININE 1.2 05/26/2020   CREATININE 1.33 03/06/2020    Lab Results  Component Value Date   WBC 5.8 05/26/2020   HGB 15.0 05/26/2020   HCT 42 05/26/2020   PLT 246 05/26/2020   GLUCOSE 150 (H) 09/04/2020   CHOL 126 09/04/2020   TRIG 256.0 (H) 09/04/2020   HDL 27.10 (L) 09/04/2020   LDLDIRECT 78.0 09/04/2020   LDLCALC 65 05/26/2020   ALT 49 09/04/2020   AST 35 09/04/2020   NA 138 09/04/2020   K 4.2 09/04/2020   CL 102 09/04/2020   CREATININE 1.39 09/04/2020   BUN 17 09/04/2020   CO2 28 09/04/2020   TSH 3.30 05/26/2020   PSA 0.7 05/26/2020   HGBA1C 6.6 (H) 09/04/2020   MICROALBUR 1.6 03/06/2020    No results found.  Assessment & Plan:     Essential hypertension Well controlled on current regimen of telmisartan/cht 80/25 mg once daily . Renal function stable, no changes today.  Hyperlipidemia LDL and triglycerides are at goal on fenofibrate and simvastatin. He has no side effects and liver enzymes are normal. No changes today   Lab Results  Component Value Date   CHOL 126 09/04/2020   HDL 27.10 (L) 09/04/2020   LDLCALC 65 05/26/2020   LDLDIRECT 78.0 09/04/2020   TRIG 256.0 (H) 09/04/2020   CHOLHDL 5 09/04/2020   Lab Results  Component Value Date   ALT 49 09/04/2020   AST 35 09/04/2020   ALKPHOS 39 09/04/2020   BILITOT 0.4 09/04/2020     Hepatic steatosis Hepatic enzymes are nearly normalized and he has no signs of cirrhosis or synthetic dysfunction .  continue low glycemic index diet, statin, renew efforts to get  BMI < 30 ,  and continuing  Metformin.  Checking hepatitis A/B titres   Lab Results  Component Value Date   ALT 49 09/04/2020   AST 35 09/04/2020   ALKPHOS 39 09/04/2020   BILITOT 0.4 09/04/2020       Newly diagnosed diabetes ([place]) Improved control since  Starting metformin 500 mg bid . He has no proteinuria.  Taking statin and ARB  Lab Results  Component Value Date   HGBA1C 6.6 (H) 09/04/2020   Lab Results  Component Value Date   MICROALBUR 1.6 03/06/2020   MICROALBUR 0.4 12/08/2018       OSA (obstructive sleep apnea) Diagnosed with home sleep study recently, noting an AHI of 29 in Sept 2021.  CPAP has been ordered    Updated Medication List Outpatient Encounter Medications as of 09/04/2020  Medication Sig  . aspirin 81 MG tablet Take 81 mg by mouth at bedtime.   . blood glucose meter kit and supplies Use to check blood sugar once daily.  . fenofibrate 160 MG tablet TAKE 1 TABLET DAILY  . metFORMIN (GLUCOPHAGE) 500 MG tablet Take 1 tablet (500 mg total) by mouth 2 (two) times daily with a meal.  . montelukast (SINGULAIR) 10 MG tablet TAKE 1 TABLET AT BEDTIME  .  pantoprazole (PROTONIX) 40 MG tablet Take 1 tablet by mouth once daily  . sildenafil (REVATIO) 20 MG tablet TAKE 1 TABLET BY MOUTH THREE TIMES DAILY  . simvastatin (ZOCOR) 20 MG tablet TAKE 1 TABLET DAILY AT BEDTIME  . telmisartan-hydrochlorothiazide (MICARDIS HCT) 80-25 MG tablet TAKE 1 TABLET DAILY  . traZODone (DESYREL) 100 MG tablet TAKE 1 TABLET AT BEDTIME AS NEEDED FOR SLEEP  . [DISCONTINUED] albuterol (VENTOLIN HFA) 108 (90 Base) MCG/ACT inhaler Inhale 2 puffs into the lungs every 6 (six) hours as needed for wheezing or shortness of breath. (Patient not taking: Reported on 09/04/2020)  . [DISCONTINUED] fluticasone (FLONASE) 50 MCG/ACT nasal spray Place 2 sprays into both nostrils daily for 14 days.   No facility-administered encounter medications on file as of 09/04/2020.    I have discontinued Justin Mend. Hainsworth's albuterol and fluticasone. I am also having him maintain his aspirin, blood glucose meter kit and supplies, fenofibrate, traZODone, simvastatin, montelukast, telmisartan-hydrochlorothiazide, pantoprazole, sildenafil, and metFORMIN.  No orders of the defined types were placed in this encounter.   Medications Discontinued During This Encounter  Medication Reason  . fluticasone (FLONASE) 50 MCG/ACT nasal spray   . albuterol (VENTOLIN HFA) 108 (90 Base) MCG/ACT inhaler     Follow-up: Return in about 3 months (around 12/05/2020) for follow up diabetes.   Crecencio Mc, MD

## 2020-09-04 NOTE — Patient Instructions (Addendum)
Please continue checking blood sugars,  But Do a 2 hr  post biscuit check and a  2 hr post dinner check    (fastings not necessary any more)  Your mid morning sugars are dropping because your breakfast doesn't have enough protein in it .  Add egg/cheese to your biscuit,  Or try the [place] [employer] Eggwhiches  Instead   Reduce your portion size at dinner .  Fill up on the vegetables.  30 minutes of exercise daily is your goal  If your hepatitis A/B antibodies are zero,  You'll need to start the vaccination series over.  If not, just one more dose will do

## 2020-09-05 LAB — HEPATITIS A ANTIBODY, TOTAL: Hepatitis A AB,Total: REACTIVE — AB

## 2020-09-05 LAB — HEPATITIS B SURFACE ANTIBODY, QUANTITATIVE: Hep B S AB Quant (Post): 108 m[IU]/mL (ref >=10)

## 2020-09-05 LAB — HEPATITIS C ANTIBODY
Hepatitis C Ab: NONREACTIVE
SIGNAL TO CUT-OFF: 0.02 (ref <1.00)

## 2020-09-06 NOTE — Assessment & Plan Note (Signed)
Improved control since  Starting metformin 500 mg bid . He has no proteinuria.  Taking statin and ARB  Lab Results  Component Value Date   HGBA1C 6.6 (H) 09/04/2020   Lab Results  Component Value Date   MICROALBUR 1.6 03/06/2020   MICROALBUR 0.4 12/08/2018

## 2020-09-06 NOTE — Assessment & Plan Note (Signed)
LDL and triglycerides are at goal on fenofibrate and simvastatin. He has no side effects and liver enzymes are normal. No changes today   Lab Results  Component Value Date   CHOL 126 09/04/2020   HDL 27.10 (L) 09/04/2020   LDLCALC 65 05/26/2020   LDLDIRECT 78.0 09/04/2020   TRIG 256.0 (H) 09/04/2020   CHOLHDL 5 09/04/2020   Lab Results  Component Value Date   ALT 49 09/04/2020   AST 35 09/04/2020   ALKPHOS 39 09/04/2020   BILITOT 0.4 09/04/2020

## 2020-09-06 NOTE — Assessment & Plan Note (Signed)
Diagnosed with home sleep study recently, noting an AHI of 29 in Sept 2021.  CPAP has been ordered

## 2020-09-06 NOTE — Assessment & Plan Note (Addendum)
Hepatic enzymes are nearly normalized and he has no signs of cirrhosis or synthetic dysfunction .  continue low glycemic index diet, statin, renew efforts to get  BMI < 30 ,  and continuing  Metformin.  Checking hepatitis A/B titres   Lab Results  Component Value Date   ALT 49 09/04/2020   AST 35 09/04/2020   ALKPHOS 39 09/04/2020   BILITOT 0.4 09/04/2020

## 2020-09-06 NOTE — Progress Notes (Signed)
.  Your diabetes remains under excellent control  And your cholesterol and other labs are also normal. Please continue your current medications. return in 4 months for follow up on diabetes   Your hepatitis A and B titres were positive, indicating that you do not need to be revaccinated at this time. [facility],   Deborra Medina, MD

## 2020-09-06 NOTE — Assessment & Plan Note (Signed)
Well controlled on current regimen of telmisartan/cht 80/25 mg once daily . Renal function stable, no changes today. ?

## 2020-09-08 ENCOUNTER — Other Ambulatory Visit: Payer: Self-pay

## 2020-09-08 ENCOUNTER — Ambulatory Visit (INDEPENDENT_AMBULATORY_CARE_PROVIDER_SITE_OTHER): Payer: BC Managed Care – PPO | Admitting: Gastroenterology

## 2020-09-08 ENCOUNTER — Encounter: Payer: Self-pay | Admitting: Gastroenterology

## 2020-09-08 VITALS — BP 143/91 | HR 98 | Temp 98.0°F | Ht 67.0 in | Wt 229.5 lb

## 2020-09-08 DIAGNOSIS — R1319 Other dysphagia: Secondary | ICD-10-CM

## 2020-09-08 DIAGNOSIS — K760 Fatty (change of) liver, not elsewhere classified: Secondary | ICD-10-CM | POA: Diagnosis not present

## 2020-09-08 DIAGNOSIS — R29898 Other symptoms and signs involving the musculoskeletal system: Secondary | ICD-10-CM | POA: Insufficient documentation

## 2020-09-08 DIAGNOSIS — M25519 Pain in unspecified shoulder: Secondary | ICD-10-CM | POA: Insufficient documentation

## 2020-09-08 DIAGNOSIS — M719 Bursopathy, unspecified: Secondary | ICD-10-CM | POA: Insufficient documentation

## 2020-09-08 NOTE — Progress Notes (Signed)
[name], MD [address]  [place]  [place], [place] [address]  Main: [phone]  Fax: [phone]    Gastroenterology Consultation  Referring Provider:     Crecencio Mc, MD Primary Care Physician:  Crecencio Mc, MD Primary Gastroenterologist:  Dr. [name] Reason for Consultation:     Dysphagia        HPI:   Christopher Vazquez is a 54 y.o. male referred by Dr. Crecencio Mc, MD  for consultation & management of dysphagia.  Patient reports approximately 2 to 3 months history of food getting stuck in his upper chest.  He had history of dysphagia, in 2014, underwent EGD with dilation for mild GE junction stricture.  Esophageal biopsies were not performed at that time.  He has been taking Protonix 40 mg daily in the morning since then.  He noticed return of symptoms about 3 months ago as described above.  He also reports regurgitation of food sometimes.  He denies nocturnal reflux.  He does have history of diabetes, did well to bring his hemoglobin A1c down from 7.1-5.9.  In last 3 months, he was liberal in choosing his foods, hemoglobin A1c increased to 6.6 and he gained some weight.  He does not smoke or drink alcohol Also has history of fatty liver based on ultrasound with Dopplers in 2019  NSAIDs: None  Antiplts/Anticoagulants/Anti thrombotics: None  GI Procedures: Colonoscopy in 2019 normal No family history of colon cancer EGD in 2014, GE junction stricture, dilated  Past Medical History:  Diagnosis Date  . Complication of anesthesia    with tonsillectomy sever swelling of throat, possible laryngiospasm.  Marland Kitchen GERD (gastroesophageal reflux disease)   . Hyperlipidemia   . Hypertension     Past Surgical History:  Procedure Laterality Date  . CHOLESTEATOMA EXCISION Left 2000   left ear  . COLONOSCOPY WITH PROPOFOL N/A 11/17/2017   Procedure: COLONOSCOPY WITH PROPOFOL;  Surgeon: Lin Landsman, MD;  Location: [facility] ENDOSCOPY;  Service:  Gastroenterology;  Laterality: N/A;  . HERNIA REPAIR    . KNEE ARTHROSCOPY WITH MEDIAL MENISECTOMY Left 07/01/2016   Procedure: left knee arthroscopy with partial medial menisectomy;  Surgeon: Hessie Knows, MD;  Location: [facility] ORS;  Service: Orthopedics;  Laterality: Left;  . TONSILLECTOMY AND ADENOIDECTOMY    . UMBILICAL HERNIA REPAIR N/A 08/20/2019   Procedure: LAPAROSCOPIC ASSISTED UMBILICAL HERNIA REPAIR WITH MESH;  Surgeon: Johnathan Hausen, MD;  Location: [facility] ORS;  Service: General;  Laterality: N/A;    Current Outpatient Medications:  .  aspirin 81 MG tablet, Take 81 mg by mouth at bedtime. , Disp: , Rfl:  .  blood glucose meter kit and supplies, Use to check blood sugar once daily., Disp: 1 each, Rfl: 0 .  fenofibrate 160 MG tablet, TAKE 1 TABLET DAILY, Disp: 90 tablet, Rfl: 3 .  metFORMIN (GLUCOPHAGE) 500 MG tablet, Take 1 tablet (500 mg total) by mouth 2 (two) times daily with a meal., Disp: 180 tablet, Rfl: 1 .  montelukast (SINGULAIR) 10 MG tablet, TAKE 1 TABLET AT BEDTIME, Disp: 90 tablet, Rfl: 3 .  pantoprazole (PROTONIX) 40 MG tablet, Take 1 tablet by mouth once daily, Disp: 90 tablet, Rfl: 1 .  sildenafil (REVATIO) 20 MG tablet, TAKE 1 TABLET BY MOUTH THREE TIMES DAILY, Disp: 90 tablet, Rfl: 0 .  simvastatin (ZOCOR) 20 MG tablet, TAKE 1 TABLET DAILY AT BEDTIME, Disp: 90 tablet, Rfl: 3 .  telmisartan-hydrochlorothiazide (MICARDIS HCT) 80-25 MG tablet, TAKE  1 TABLET DAILY, Disp: 90 tablet, Rfl: 3 .  traZODone (DESYREL) 100 MG tablet, TAKE 1 TABLET AT BEDTIME AS NEEDED FOR SLEEP, Disp: 90 tablet, Rfl: 3   Family History  Problem Relation Age of Onset  . Diabetes Mother   . Hypertension Mother   . Uterine cancer Mother   . Aortic aneurysm Father 56  . Heart disease Father 59  . AAA (abdominal aortic aneurysm) Father      Social History   Tobacco Use  . Smoking status: Never Smoker  . Smokeless tobacco: Never Used  Vaping Use  . Vaping Use: Never used  Substance Use  Topics  . Alcohol use: Not Currently  . Drug use: No    Allergies as of 09/08/2020 - Review Complete 09/08/2020  Allergen Reaction Noted  . Scopolamine Other (See Comments) 05/10/2016  . Shellfish allergy  06/21/2016  . Niacin and related Rash 11/19/2015    Review of Systems:    All systems reviewed and negative except where noted in HPI.   Physical Exam:  BP (!) 143/91 (BP Location: Left Arm, Patient Position: Sitting, Cuff Size: Normal)   Pulse 98   Temp 98 F (36.7 C) (Oral)   Ht _0  (1.702 m)   Wt 229 lb 8 oz (104.1 kg)   BMI 35.94 kg/m  No LMP for male patient.  General:   Alert,  Well-developed, well-nourished, pleasant and cooperative in NAD Head:  Normocephalic and atraumatic. Eyes:  Sclera clear, no icterus.   Conjunctiva pink. Ears:  Normal auditory acuity. Nose:  No deformity, discharge, or lesions. Mouth:  No deformity or lesions,oropharynx pink & moist. Neck:  Supple; no masses or thyromegaly. Lungs:  Respirations even and unlabored.  Clear throughout to auscultation.   No wheezes, crackles, or rhonchi. No acute distress. Heart:  Regular rate and rhythm; no murmurs, clicks, rubs, or gallops. Abdomen:  Normal bowel sounds. Soft, non-tender and non-distended without masses, hepatosplenomegaly or hernias noted.  No guarding or rebound tenderness.   Rectal: Not performed Msk:  Symmetrical without gross deformities. Good, equal movement & strength bilaterally. Pulses:  Normal pulses noted. Extremities:  No clubbing or edema.  No cyanosis. Neurologic:  Alert and oriented x3;  grossly normal neurologically. Skin:  Intact without significant lesions or rashes. No jaundice. Psych:  Alert and cooperative. Normal mood and affect.  Imaging Studies: Reviewed  Assessment and Plan:   Christopher Vazquez is a 54 y.o. male with metabolic syndrome, history of dysphagia s/p dilation in 2014, fatty liver is seen in consultation for recurrence of  dysphagia  Dysphagia: Recommend EGD with esophageal biopsies to evaluate for instability esophagitis, rule out stricture, possible dilation Increase Protonix to 40 mg p.o. twice daily before meals Advised to avoid hard meats until endoscopy  Fatty liver: LFTs have been normal Discussed in length regarding treatment of fatty liver which is primarily lifestyle modification, tight control of diabetes and hyperlipidemia, progression to cirrhosis if left untreated Recommend right upper quadrant ultrasound every 2 to 3 years Check LFTs annually   Follow up in 2 months   [name], MD

## 2020-09-12 ENCOUNTER — Other Ambulatory Visit: Payer: Self-pay

## 2020-09-12 ENCOUNTER — Other Ambulatory Visit
Admission: RE | Admit: 2020-09-12 | Discharge: 2020-09-12 | Disposition: A | Discharge status: Home / Self Care | Payer: BC Managed Care – PPO | Source: Ambulatory Visit | Attending: Gastroenterology | Admitting: Gastroenterology

## 2020-09-12 DIAGNOSIS — Z20822 Contact with and (suspected) exposure to covid-19: Secondary | ICD-10-CM | POA: Insufficient documentation

## 2020-09-12 DIAGNOSIS — Z01812 Encounter for preprocedural laboratory examination: Secondary | ICD-10-CM | POA: Insufficient documentation

## 2020-09-12 LAB — SARS CORONAVIRUS 2 (TAT 6-24 HRS): SARS Coronavirus 2: NEGATIVE

## 2020-09-15 ENCOUNTER — Encounter: Payer: Self-pay | Admitting: Gastroenterology

## 2020-09-16 ENCOUNTER — Encounter
Admission: RE | Disposition: A | Discharge status: Home / Self Care | Payer: Self-pay | Source: Home / Self Care | Attending: Gastroenterology

## 2020-09-16 ENCOUNTER — Ambulatory Visit
Admission: RE | Admit: 2020-09-16 | Discharge: 2020-09-16 | Disposition: A | Discharge status: Home / Self Care | Payer: BC Managed Care – PPO | Attending: Gastroenterology | Admitting: Gastroenterology

## 2020-09-16 ENCOUNTER — Other Ambulatory Visit: Payer: Self-pay

## 2020-09-16 ENCOUNTER — Encounter: Payer: Self-pay | Admitting: Gastroenterology

## 2020-09-16 ENCOUNTER — Ambulatory Visit: Payer: BC Managed Care – PPO | Admitting: Certified Registered"

## 2020-09-16 DIAGNOSIS — K219 Gastro-esophageal reflux disease without esophagitis: Secondary | ICD-10-CM | POA: Diagnosis not present

## 2020-09-16 DIAGNOSIS — K259 Gastric ulcer, unspecified as acute or chronic, without hemorrhage or perforation: Secondary | ICD-10-CM | POA: Insufficient documentation

## 2020-09-16 DIAGNOSIS — E119 Type 2 diabetes mellitus without complications: Secondary | ICD-10-CM | POA: Diagnosis not present

## 2020-09-16 DIAGNOSIS — Z888 Allergy status to other drugs, medicaments and biological substances status: Secondary | ICD-10-CM | POA: Diagnosis not present

## 2020-09-16 DIAGNOSIS — Z7984 Long term (current) use of oral hypoglycemic drugs: Secondary | ICD-10-CM | POA: Diagnosis not present

## 2020-09-16 DIAGNOSIS — Z833 Family history of diabetes mellitus: Secondary | ICD-10-CM | POA: Insufficient documentation

## 2020-09-16 DIAGNOSIS — R1310 Dysphagia, unspecified: Secondary | ICD-10-CM | POA: Insufficient documentation

## 2020-09-16 DIAGNOSIS — Z79899 Other long term (current) drug therapy: Secondary | ICD-10-CM | POA: Insufficient documentation

## 2020-09-16 DIAGNOSIS — K2960 Other gastritis without bleeding: Secondary | ICD-10-CM | POA: Diagnosis not present

## 2020-09-16 DIAGNOSIS — R1319 Other dysphagia: Secondary | ICD-10-CM

## 2020-09-16 DIAGNOSIS — Z7982 Long term (current) use of aspirin: Secondary | ICD-10-CM | POA: Diagnosis not present

## 2020-09-16 DIAGNOSIS — Z91013 Allergy to seafood: Secondary | ICD-10-CM | POA: Insufficient documentation

## 2020-09-16 DIAGNOSIS — G4733 Obstructive sleep apnea (adult) (pediatric): Secondary | ICD-10-CM | POA: Diagnosis not present

## 2020-09-16 DIAGNOSIS — Z8249 Family history of ischemic heart disease and other diseases of the circulatory system: Secondary | ICD-10-CM | POA: Diagnosis not present

## 2020-09-16 DIAGNOSIS — I10 Essential (primary) hypertension: Secondary | ICD-10-CM | POA: Insufficient documentation

## 2020-09-16 DIAGNOSIS — K2970 Gastritis, unspecified, without bleeding: Secondary | ICD-10-CM | POA: Insufficient documentation

## 2020-09-16 DIAGNOSIS — Z8049 Family history of malignant neoplasm of other genital organs: Secondary | ICD-10-CM | POA: Diagnosis not present

## 2020-09-16 HISTORY — DX: Type 2 diabetes mellitus without complications: E11.9

## 2020-09-16 HISTORY — PX: ESOPHAGOGASTRODUODENOSCOPY (EGD) WITH PROPOFOL: SHX5813

## 2020-09-16 LAB — GLUCOSE, CAPILLARY: Glucose-Capillary: 117 mg/dL — ABNORMAL HIGH (ref 70–99)

## 2020-09-16 SURGERY — ESOPHAGOGASTRODUODENOSCOPY (EGD) WITH PROPOFOL
Anesthesia: General

## 2020-09-16 MED ORDER — PROPOFOL 10 MG/ML IV BOLUS
INTRAVENOUS | Status: AC
  Filled 2020-09-16: qty 40

## 2020-09-16 MED ORDER — LIDOCAINE HCL (PF) 2 % IJ SOLN
INTRAMUSCULAR | Status: AC
  Filled 2020-09-16: qty 5

## 2020-09-16 MED ORDER — LIDOCAINE HCL (CARDIAC) PF 100 MG/5ML IV SOSY
PREFILLED_SYRINGE | INTRAVENOUS | Status: DC | PRN
  Administered 2020-09-16: 100 mg via INTRAVENOUS

## 2020-09-16 MED ORDER — SODIUM CHLORIDE 0.9 % IV SOLN: INTRAVENOUS | Status: DC

## 2020-09-16 MED ORDER — PROPOFOL 10 MG/ML IV BOLUS
INTRAVENOUS | Status: DC | PRN
  Administered 2020-09-16: 122 ug/kg/min via INTRAVENOUS
  Administered 2020-09-16: 100 mg via INTRAVENOUS

## 2020-09-16 NOTE — Anesthesia Postprocedure Evaluation (Signed)
Anesthesia Post Note  Patient: Christopher Vazquez  Procedure(s) Performed: ESOPHAGOGASTRODUODENOSCOPY (EGD) WITH PROPOFOL (N/A )  Patient location during evaluation: Endoscopy Anesthesia Type: General Level of consciousness: awake and awake and alert Pain management: pain level controlled Vital Signs Assessment: post-procedure vital signs reviewed and stable Respiratory status: spontaneous breathing and nonlabored ventilation Cardiovascular status: blood pressure returned to baseline and stable Postop Assessment: no headache and no apparent nausea or vomiting Anesthetic complications: no   No complications documented.   Last Vitals:  Vitals:   09/16/20 1120 09/16/20 1203  BP: 122/86   Pulse: 68   Resp: 18   Temp: (!) 36.3 C (!) 35.9 C  SpO2: 98%     Last Pain:  Vitals:   09/16/20 1233  TempSrc:   PainSc: 0-No pain                 Neva Seat

## 2020-09-16 NOTE — Anesthesia Preprocedure Evaluation (Signed)
Anesthesia Evaluation  Patient identified by MRN, date of birth, ID band Patient awake    Reviewed: Allergy & Precautions, NPO status , Patient's Chart, lab work & pertinent test results  Airway Mallampati: III       Dental no notable dental hx.    Pulmonary sleep apnea ,    Pulmonary exam normal breath sounds clear to auscultation       Cardiovascular hypertension, negative cardio ROS Normal cardiovascular exam Rhythm:Regular Rate:Normal     Neuro/Psych negative neurological ROS  negative psych ROS   GI/Hepatic Neg liver ROS, GERD  ,  Endo/Other  negative endocrine ROSdiabetes  Renal/GU negative Renal ROS  negative genitourinary   Musculoskeletal negative musculoskeletal ROS (+)   Abdominal   Peds negative pediatric ROS (+)  Hematology negative hematology ROS (+)   Anesthesia Other Findings   Reproductive/Obstetrics negative OB ROS                             Anesthesia Physical Anesthesia Plan  ASA: III  Anesthesia Plan: General   Post-op Pain Management:    Induction: Intravenous  PONV Risk Score and Plan: 2 and Propofol infusion  Airway Management Planned: Nasal Cannula  Additional Equipment: None  Intra-op Plan:   Post-operative Plan:   Informed Consent: I have reviewed the patients History and Physical, chart, labs and discussed the procedure including the risks, benefits and alternatives for the proposed anesthesia with the patient or authorized representative who has indicated his/her understanding and acceptance.       Plan Discussed with: CRNA, Anesthesiologist and Surgeon  Anesthesia Plan Comments:         Anesthesia Quick Evaluation

## 2020-09-16 NOTE — Transfer of Care (Signed)
Immediate Anesthesia Transfer of Care Note  Patient: Christopher Vazquez  Procedure(s) Performed: ESOPHAGOGASTRODUODENOSCOPY (EGD) WITH PROPOFOL (N/A )  Patient Location: PACU and Endoscopy Unit  Anesthesia Type:General  Level of Consciousness: awake  Airway & Oxygen Therapy: Patient Spontanous Breathing  Post-op Assessment: Report given to RN  Post vital signs: Reviewed and stable  Last Vitals:  Vitals Value Taken Time  BP 87/53 09/16/20 1203  Temp    Pulse 78 09/16/20 1203  Resp 17 09/16/20 1203  SpO2 96 % 09/16/20 1203  Vitals shown include unvalidated device data.  Last Pain:  Vitals:   09/16/20 1120  TempSrc: Temporal  PainSc: 0-No pain         Complications: No complications documented.

## 2020-09-16 NOTE — H&P (Signed)
Christopher Darby, MD [address]  [place]  [place], [place] [address]  Main: [phone]  Fax: [phone] Pager: [phone]  Primary Care Physician:  Crecencio Mc, MD Primary Gastroenterologist:  Dr. Cephas Vazquez  Pre-Procedure History & Physical: HPI:  Christopher Vazquez is a 54 y.o. male is here for an endoscopy.   Past Medical History:  Diagnosis Date  . Complication of anesthesia    with tonsillectomy sever swelling of throat, possible laryngiospasm.  . Diabetes mellitus without complication ([place])   . GERD (gastroesophageal reflux disease)   . Hyperlipidemia   . Hypertension     Past Surgical History:  Procedure Laterality Date  . CHOLESTEATOMA EXCISION Left 2000   left ear  . COLONOSCOPY WITH PROPOFOL N/A 11/17/2017   Procedure: COLONOSCOPY WITH PROPOFOL;  Surgeon: Lin Landsman, MD;  Location: [facility] ENDOSCOPY;  Service: Gastroenterology;  Laterality: N/A;  . HERNIA REPAIR    . KNEE ARTHROSCOPY WITH MEDIAL MENISECTOMY Left 07/01/2016   Procedure: left knee arthroscopy with partial medial menisectomy;  Surgeon: Hessie Knows, MD;  Location: [facility] ORS;  Service: Orthopedics;  Laterality: Left;  . TONSILLECTOMY AND ADENOIDECTOMY    . UMBILICAL HERNIA REPAIR N/A 08/20/2019   Procedure: LAPAROSCOPIC ASSISTED UMBILICAL HERNIA REPAIR WITH MESH;  Surgeon: Johnathan Hausen, MD;  Location: [facility] ORS;  Service: General;  Laterality: N/A;    Prior to Admission medications   Medication Sig Start Date End Date Taking? Authorizing Provider  aspirin 81 MG tablet Take 81 mg by mouth at bedtime.     [provider]  blood glucose meter kit and supplies Use to check blood sugar once daily. 01/01/20   Crecencio Mc, MD  fenofibrate 160 MG tablet TAKE 1 TABLET DAILY 01/23/20   Crecencio Mc, MD  metFORMIN (GLUCOPHAGE) 500 MG tablet Take 1 tablet (500 mg total) by mouth 2 (two) times daily with a meal. 08/18/20   Crecencio Mc, MD  montelukast (SINGULAIR) 10 MG  tablet TAKE 1 TABLET AT BEDTIME 05/01/20   Crecencio Mc, MD  pantoprazole (PROTONIX) 40 MG tablet Take 1 tablet by mouth once daily 06/27/20   Crecencio Mc, MD  sildenafil (REVATIO) 20 MG tablet TAKE 1 TABLET BY MOUTH THREE TIMES DAILY 06/27/20   Crecencio Mc, MD  simvastatin (ZOCOR) 20 MG tablet TAKE 1 TABLET DAILY AT BEDTIME 01/28/20   Crecencio Mc, MD  telmisartan-hydrochlorothiazide (MICARDIS HCT) 80-25 MG tablet TAKE 1 TABLET DAILY 06/16/20   Crecencio Mc, MD  traZODone (DESYREL) 100 MG tablet TAKE 1 TABLET AT BEDTIME AS NEEDED FOR SLEEP 01/23/20   Crecencio Mc, MD    Allergies as of 09/08/2020 - Review Complete 09/08/2020  Allergen Reaction Noted  . Scopolamine Other (See Comments) 05/10/2016  . Shellfish allergy  06/21/2016  . Niacin and related Rash 11/19/2015    Family History  Problem Relation Age of Onset  . Diabetes Mother   . Hypertension Mother   . Uterine cancer Mother   . Aortic aneurysm Father 66  . Heart disease Father 26  . AAA (abdominal aortic aneurysm) Father     Social History   Socioeconomic History  . Marital status: Married    Spouse name: Not on file  . Number of children: 3  . Years of education: Not on file  . Highest education level: Not on file  Occupational History  . Occupation: Buyer, retail: [employer]  Tobacco Use  . Smoking  status: Never Smoker  . Smokeless tobacco: Never Used  Vaping Use  . Vaping Use: Never used  Substance and Sexual Activity  . Alcohol use: Not Currently  . Drug use: No  . Sexual activity: Yes    Birth control/protection: None    Comment: Married  Other Topics Concern  . Not on file  Social History Narrative  . Not on file   Social Determinants of Health   Financial Resource Strain:   . Difficulty of Paying Living Expenses: Not on file  Food Insecurity:   . Worried About Charity fundraiser in the Last Year: Not on file  . Ran Out of Food in the Last Year: Not on file   Transportation Needs:   . Lack of Transportation (Medical): Not on file  . Lack of Transportation (Non-Medical): Not on file  Physical Activity:   . Days of Exercise per Week: Not on file  . Minutes of Exercise per Session: Not on file  Stress:   . Feeling of Stress : Not on file  Social Connections:   . Frequency of Communication with Friends and Family: Not on file  . Frequency of Social Gatherings with Friends and Family: Not on file  . Attends Religious Services: Not on file  . Active Member of Clubs or Organizations: Not on file  . Attends Archivist Meetings: Not on file  . Marital Status: Not on file  Intimate Partner Violence:   . Fear of Current or Ex-Partner: Not on file  . Emotionally Abused: Not on file  . Physically Abused: Not on file  . Sexually Abused: Not on file    Review of Systems: See HPI, otherwise negative ROS  Physical Exam: BP 122/86   Pulse 68   Temp (!) 97.4 F (36.3 C) (Temporal)   Resp 18   Ht _0  (1.702 m)   Wt 102.5 kg   SpO2 98%   BMI 35.40 kg/m  General:   Alert,  pleasant and cooperative in NAD Head:  Normocephalic and atraumatic. Neck:  Supple; no masses or thyromegaly. Lungs:  Clear throughout to auscultation.    Heart:  Regular rate and rhythm. Abdomen:  Soft, nontender and nondistended. Normal bowel sounds, without guarding, and without rebound.   Neurologic:  Alert and  oriented x4;  grossly normal neurologically.  Impression/Plan: Christopher Vazquez is here for an endoscopy to be performed for dysphagia  Risks, benefits, limitations, and alternatives regarding  endoscopy have been reviewed with the patient.  Questions have been answered.  All parties agreeable.   Sherri Sear, MD  09/16/2020, 11:33 AM

## 2020-09-16 NOTE — Op Note (Signed)
[facility] Gastroenterology Patient Name: Christopher Vazquez Procedure Date: 09/16/2020 11:24 AM MRN: [id number] Account #: [id number] Date of Birth: 1966/08/21 Admit Type: Outpatient Age: 54 Room: [facility] ENDO ROOM 3 Gender: Male Note Status: Finalized Procedure:             Upper GI endoscopy Indications:           Dysphagia Providers:             Lin Landsman MD, MD Referring MD:          Deborra Medina, MD (Referring MD) Medicines:             General Anesthesia Complications:         No immediate complications. Estimated blood loss:                         Minimal. Procedure:             Pre-Anesthesia Assessment:                        - Prior to the procedure, a History and Physical was                         performed, and patient medications and allergies were                         reviewed. The patient is competent. The risks and                         benefits of the procedure and the sedation options and                         risks were discussed with the patient. All questions                         were answered and informed consent was obtained.                         Patient identification and proposed procedure were                         verified by the physician, the nurse, the                         anesthesiologist, the anesthetist and the technician                         in the pre-procedure area in the procedure room in the                         endoscopy suite. Mental Status Examination: alert and                         oriented. Airway Examination: normal oropharyngeal                         airway and neck mobility. Respiratory Examination:  clear to auscultation. CV Examination: normal.                         Prophylactic Antibiotics: The patient does not require                         prophylactic antibiotics. Prior Anticoagulants: The                         patient has taken no previous  anticoagulant or                         antiplatelet agents. ASA Grade Assessment: III - A                         patient with severe systemic disease. After reviewing                         the risks and benefits, the patient was deemed in                         satisfactory condition to undergo the procedure. The                         anesthesia plan was to use general anesthesia.                         Immediately prior to administration of medications,                         the patient was re-assessed for adequacy to receive                         sedatives. The heart rate, respiratory rate, oxygen                         saturations, blood pressure, adequacy of pulmonary                         ventilation, and response to care were monitored                         throughout the procedure. The physical status of the                         patient was re-assessed after the procedure.                        After obtaining informed consent, the endoscope was                         passed under direct vision. Throughout the procedure,                         the patient's blood pressure, pulse, and oxygen                         saturations were monitored continuously. The Endoscope  was introduced through the mouth, and advanced to the                         second part of duodenum. The upper GI endoscopy was                         accomplished without difficulty. The patient tolerated                         the procedure well. Findings:      The duodenal bulb and second portion of the duodenum were normal.      Multiple dispersed small erosions with no bleeding and no stigmata of       recent bleeding were found in the gastric antrum. Biopsies were taken       with a cold forceps for Helicobacter pylori testing.      The gastric body and incisura were normal. Biopsies were taken with a       cold forceps for Helicobacter pylori testing.      The  cardia and gastric fundus were normal on retroflexion.      Esophagogastric landmarks were identified: the gastroesophageal junction       was found at 40 cm from the incisors.      The gastroesophageal junction and examined esophagus were normal.       Biopsies were obtained from the proximal and distal esophagus with cold       forceps for histology of suspected eosinophilic esophagitis. A TTS       dilator was passed through the scope. Empiric dilation with an 18-19-20       mm balloon dilator was performed to 20 mm. The dilation site was       examined following endoscope reinsertion and showed mild improvement in       luminal narrowing. Estimated blood loss was minimal. Impression:            - Normal duodenal bulb and second portion of the                         duodenum.                        - Erosive gastropathy with no bleeding and no stigmata                         of recent bleeding. Biopsied.                        - Normal gastric body and incisura. Biopsied.                        - Esophagogastric landmarks identified.                        - Normal gastroesophageal junction and esophagus.                         Biopsied. Dilated empirically. Recommendation:        - Discharge patient to home (with escort).                        - Diabetic (  ADA) diet today.                        - Continue present medications.                        - Await pathology results.                        - Return to my office as previously scheduled.                        - Follow an antireflux regimen. Procedure Code(s):     --- Professional ---                        [phone], Esophagogastroduodenoscopy, flexible,                         transoral; with transendoscopic balloon dilation of                         esophagus (less than 30 mm diameter)                        43239, 59, Esophagogastroduodenoscopy, flexible,                         transoral; with biopsy, single or  multiple Diagnosis Code(s):     --- Professional ---                        K31.89, Other diseases of stomach and duodenum                        R13.10, Dysphagia, unspecified CPT copyright 2019 American Medical Association. All rights reserved. The codes documented in this report are preliminary and upon coder review may  be revised to meet current compliance requirements. Dr. Ulyess Mort Lin Landsman MD, MD 09/16/2020 11:58:43 AM This report has been signed electronically. Number of Addenda: 0 Note Initiated On: 09/16/2020 11:24 AM Estimated Blood Loss:  Estimated blood loss: none.      [facility]

## 2020-09-17 ENCOUNTER — Encounter: Payer: Self-pay | Admitting: Gastroenterology

## 2020-09-17 LAB — SURGICAL PATHOLOGY

## 2020-09-17 MED ORDER — PANTOPRAZOLE SODIUM 40 MG PO TBEC
40.0000 mg | DELAYED_RELEASE_TABLET | Freq: Two times a day (BID) | ORAL | 0 refills | Status: DC
Start: 2020-09-17 — End: 2020-11-17

## 2020-09-17 NOTE — Telephone Encounter (Signed)
Informed patient that medication was sent to pharmacy. He said send medication to [facility]

## 2020-09-17 NOTE — OR Nursing (Signed)
PT C/O OF BELCHING, IN MID CHEST. I INFORMED HIM TO CALL DR VANGA IF IT CONTINUES OR IF HE FEELS LIKE HIS HEART I S CAUSING HIS CHEST PAIN. I TOLD PT TO CALL DR VANGA JUST TO LET HER KNOW.

## 2020-09-23 ENCOUNTER — Telehealth: Payer: Self-pay

## 2020-09-23 NOTE — Telephone Encounter (Signed)
Replied to patients MyChart message as copied below and notified him that the front office would be reaching out to him to schedule an office visit as recommended by Dr. Garen Lah.  Elson Ulbrich,   Please schedule patient for follow up appointment with my self. May need a CCTA due to risk factors and symptoms.   Thanks  BA     Redmond Pulling Clydene Fake, MD 2 days ago  I have been losing my breath whilewalking upstairs and breathing very heavy And have been noticing my heart rate has been running higher than normal. I had an stress test several years ago maybe 10 years ago. Was wondering if it may be time for another one. I have heart disease that runs on both sides of my family. Actually my dad had an heart attack at 9 and also has had two aneurysms in his aorta. My family is very concerned about the way I give out after climbing stairs.   Thank you Marvis Repress.

## 2020-09-23 NOTE — Telephone Encounter (Signed)
LVM to schedule appt per nurse

## 2020-09-23 NOTE — Telephone Encounter (Signed)
Scheduled for friday

## 2020-09-26 ENCOUNTER — Ambulatory Visit (INDEPENDENT_AMBULATORY_CARE_PROVIDER_SITE_OTHER): Payer: BC Managed Care – PPO | Admitting: Cardiology

## 2020-09-26 ENCOUNTER — Encounter: Payer: Self-pay | Admitting: Cardiology

## 2020-09-26 ENCOUNTER — Other Ambulatory Visit: Payer: Self-pay

## 2020-09-26 VITALS — BP 118/80 | HR 70 | Ht 67.0 in | Wt 228.5 lb

## 2020-09-26 DIAGNOSIS — I10 Essential (primary) hypertension: Secondary | ICD-10-CM

## 2020-09-26 DIAGNOSIS — R0600 Dyspnea, unspecified: Secondary | ICD-10-CM | POA: Diagnosis not present

## 2020-09-26 DIAGNOSIS — E7800 Pure hypercholesterolemia, unspecified: Secondary | ICD-10-CM

## 2020-09-26 NOTE — Progress Notes (Signed)
Cardiology Office Note:    Date:  09/26/2020   ID:  Christopher Vazquez, DOB 1966/07/17, MRN [id number]  PCP:  Crecencio Mc, MD  Cardiologist:  Kate Sable, MD  Electrophysiologist:  None   Referring MD: Crecencio Mc, MD   Chief Complaint  Patient presents with  . other    Pt. c/o pounding in chest and shortness of breath when climbing about 12 steps. Meds reviewed by the pt. verbally.     History of Present Illness:    Christopher Vazquez is a 54 y.o. male with a hx of hypertension, hyperlipidemia, diabetes who presents due to shortness of breath with exertion.  He describes symptoms of rapid heart rates and shortness of breath whenever he goes upstairs or exerts himself.  This has been ongoing for the past 3 months.  He denies chest pain.  Denies any changes in his weight.  Recently diagnosed with diabetes and started on Metformin.  Denies ever smoking   Prior notes His father has a history of abdominal aortic aneurysm and also thoracic aortic aneurysm.  His dad was a smoker and had an MI at age 31.  Echocardiogram 09/2019 showed normal systolic and diastolic function, EF 60 to 65%   Past Medical History:  Diagnosis Date  . Complication of anesthesia    with tonsillectomy sever swelling of throat, possible laryngiospasm.  . Diabetes mellitus without complication ([place])   . GERD (gastroesophageal reflux disease)   . Hyperlipidemia   . Hypertension     Past Surgical History:  Procedure Laterality Date  . CHOLESTEATOMA EXCISION Left 2000   left ear  . COLONOSCOPY WITH PROPOFOL N/A 11/17/2017   Procedure: COLONOSCOPY WITH PROPOFOL;  Surgeon: Lin Landsman, MD;  Location: [facility] ENDOSCOPY;  Service: Gastroenterology;  Laterality: N/A;  . ESOPHAGOGASTRODUODENOSCOPY (EGD) WITH PROPOFOL N/A 09/16/2020   Procedure: ESOPHAGOGASTRODUODENOSCOPY (EGD) WITH PROPOFOL;  Surgeon: Lin Landsman, MD;  Location: [facility] ENDOSCOPY;  Service: Gastroenterology;  Laterality: N/A;  .  HERNIA REPAIR    . KNEE ARTHROSCOPY WITH MEDIAL MENISECTOMY Left 07/01/2016   Procedure: left knee arthroscopy with partial medial menisectomy;  Surgeon: Hessie Knows, MD;  Location: [facility] ORS;  Service: Orthopedics;  Laterality: Left;  . TONSILLECTOMY AND ADENOIDECTOMY    . UMBILICAL HERNIA REPAIR N/A 08/20/2019   Procedure: LAPAROSCOPIC ASSISTED UMBILICAL HERNIA REPAIR WITH MESH;  Surgeon: Johnathan Hausen, MD;  Location: [facility] ORS;  Service: General;  Laterality: N/A;    Current Medications: Current Meds  Medication Sig  . aspirin 81 MG tablet Take 81 mg by mouth at bedtime.   . blood glucose meter kit and supplies Use to check blood sugar once daily.  . fenofibrate 160 MG tablet TAKE 1 TABLET DAILY  . metFORMIN (GLUCOPHAGE) 500 MG tablet Take 1 tablet (500 mg total) by mouth 2 (two) times daily with a meal.  . montelukast (SINGULAIR) 10 MG tablet TAKE 1 TABLET AT BEDTIME  . pantoprazole (PROTONIX) 40 MG tablet Take 1 tablet (40 mg total) by mouth 2 (two) times daily before a meal.  . sildenafil (REVATIO) 20 MG tablet TAKE 1 TABLET BY MOUTH THREE TIMES DAILY  . simvastatin (ZOCOR) 20 MG tablet TAKE 1 TABLET DAILY AT BEDTIME  . telmisartan-hydrochlorothiazide (MICARDIS HCT) 80-25 MG tablet TAKE 1 TABLET DAILY  . traZODone (DESYREL) 100 MG tablet TAKE 1 TABLET AT BEDTIME AS NEEDED FOR SLEEP     Allergies:   Scopolamine, Shellfish allergy, and Niacin and related   Social History  Socioeconomic History  . Marital status: Married    Spouse name: Not on file  . Number of children: 3  . Years of education: Not on file  . Highest education level: Not on file  Occupational History  . Occupation: Buyer, retail: [employer]  Tobacco Use  . Smoking status: Never Smoker  . Smokeless tobacco: Never Used  Vaping Use  . Vaping Use: Never used  Substance and Sexual Activity  . Alcohol use: Not Currently  . Drug use: No  . Sexual activity: Yes    Birth control/protection: None     Comment: Married  Other Topics Concern  . Not on file  Social History Narrative  . Not on file   Social Determinants of Health   Financial Resource Strain:   . Difficulty of Paying Living Expenses: Not on file  Food Insecurity:   . Worried About Charity fundraiser in the Last Year: Not on file  . Ran Out of Food in the Last Year: Not on file  Transportation Needs:   . Lack of Transportation (Medical): Not on file  . Lack of Transportation (Non-Medical): Not on file  Physical Activity:   . Days of Exercise per Week: Not on file  . Minutes of Exercise per Session: Not on file  Stress:   . Feeling of Stress : Not on file  Social Connections:   . Frequency of Communication with Friends and Family: Not on file  . Frequency of Social Gatherings with Friends and Family: Not on file  . Attends Religious Services: Not on file  . Active Member of Clubs or Organizations: Not on file  . Attends Archivist Meetings: Not on file  . Marital Status: Not on file     Family History: The patient's family history includes AAA (abdominal aortic aneurysm) in his father; Aortic aneurysm (age of onset: 26) in his father; Diabetes in his mother; Heart disease (age of onset: 30) in his father; Hypertension in his mother; Uterine cancer in his mother.  ROS:   Please see the history of present illness.     All other systems reviewed and are negative.  EKGs/Labs/Other Studies Reviewed:    The following studies were reviewed today: TTE 09-16-19 1. Left ventricular ejection fraction, by visual estimation, is 60 to 65%. The left ventricle has normal function. There is no left ventricular hypertrophy.  2. Global right ventricle has normal systolic function.The right ventricular size is normal. No increase in right ventricular wall thickness.  3. Left atrial size was mildly dilated.  4. The pulmonic valve was normal in structure. Pulmonic valve regurgitation is not visualized.  5. TR signal  is inadequate for assessing pulmonary artery systolic pressure.  EKG:  EKG is  ordered today.  The ekg ordered today demonstrates normal sinus rhythm  Recent Labs: 05/26/2020: Hemoglobin 15.0; Platelets 246; TSH 3.30 09/04/2020: ALT 49; BUN 17; Creatinine, Ser 1.39; Potassium 4.2; Sodium 138  Recent Lipid Panel    Component Value Date/Time   CHOL 126 09/04/2020 1146   TRIG 256.0 (H) 09/04/2020 1146   HDL 27.10 (L) 09/04/2020 1146   CHOLHDL 5 09/04/2020 1146   VLDL 51.2 (H) 09/04/2020 1146   LDLCALC 65 05/26/2020 0000   LDLCALC 63 12/08/2018 1556   LDLDIRECT 78.0 09/04/2020 1146    Physical Exam:    VS:  BP 118/80 (BP Location: Left Arm, Patient Position: Sitting, Cuff Size: Large)   Pulse 70   Ht $R'5\' 7"'qC$  (  1.702 m)   Wt 228 lb 8 oz (103.6 kg)   SpO2 98%   BMI 35.79 kg/m     Wt Readings from Last 3 Encounters:  09/26/20 228 lb 8 oz (103.6 kg)  09/16/20 226 lb (102.5 kg)  09/08/20 229 lb 8 oz (104.1 kg)     GEN:  Well nourished, well developed in no acute distress HEENT: Normal NECK: No JVD; No carotid bruits LYMPHATICS: No lymphadenopathy CARDIAC: RRR, no murmurs, rubs, gallops RESPIRATORY:  Clear to auscultation without rales, wheezing or rhonchi  ABDOMEN: Soft, non-tender, non-distended MUSCULOSKELETAL:  No edema; No deformity  SKIN: Warm and dry NEUROLOGIC:  Alert and oriented x 3 PSYCHIATRIC:  Normal affect   ASSESSMENT:    1. Dyspnea, unspecified type   2. Essential hypertension   3. Pure hypercholesterolemia      PLAN:    In order of problems listed above:  1. Dyspnea on exertion and tachycardia with exertion.  Has risk factors of hypertension, diabetes, hyperlipidemia.  We will get a stress echo to evaluate heart rate and rhythm, presence of ischemia with exercise.  Prior echo showed normal systolic function.  Symptoms might be attributed to deconditioning, but due to risk factors, will need to evaluate for ischemia. 2. Hypertension continue telmisartan  HCTZ for blood pressure control. 3. Hyperlipidemia, on statin  Follow-up after stress echo.  Medication Adjustments/Labs and Tests Ordered: Current medicines are reviewed at length with the patient today.  Concerns regarding medicines are outlined above.  Orders Placed This Encounter  Procedures  . EKG 12-Lead  . ECHOCARDIOGRAM STRESS TEST   No orders of the defined types were placed in this encounter.   Patient Instructions  Medication Instructions:  Your physician recommends that you continue on your current medications as directed. Please refer to the Current Medication list given to you today.  *If you need a refill on your cardiac medications before your next appointment, please call your pharmacy*   Lab Work: None Ordered If you have labs (blood work) drawn today and your tests are completely normal, you will receive your results only by: Marland Kitchen MyChart Message (if you have MyChart) OR . A paper copy in the mail If you have any lab test that is abnormal or we need to change your treatment, we will call you to review the results.   Testing/Procedures:  Your physician has requested that you have an Echocardiogram Stress Test. Echocardiography is a painless test that uses sound waves to create images of your heart. It provides your doctor with information about the size and shape of your heart and how well your heart's chambers and valves are working. This procedure takes approximately one hour. There are no restrictions for this procedure.   - you may eat a light breakfast/ lunch prior to your procedure - no caffeine for 24 hours prior to your test (coffee, tea, soft drinks, or chocolate)  - no smoking/ vaping for 4 hours prior to your test - bring any inhalers with you to your test - wear comfortable clothing & tennis/ non-skid shoes to walk on the treadmill    Follow-Up: At [facility], you and your health needs are our priority.  As part of our continuing mission to  provide you with exceptional heart care, we have created designated Provider Care Teams.  These Care Teams include your primary Cardiologist (physician) and Advanced Practice Providers (APPs -  Physician Assistants and Nurse Practitioners) who all work together to provide you with the care  you need, when you need it.  We recommend signing up for the patient portal called "MyChart".  Sign up information is provided on this After Visit Summary.  MyChart is used to connect with patients for Virtual Visits (Telemedicine).  Patients are able to view lab/test results, encounter notes, upcoming appointments, etc.  Non-urgent messages can be sent to your provider as well.   To learn more about what you can do with MyChart, go to [email or web address].    Your next appointment:   Follow up after Stress Echocardiogram   The format for your next appointment:   In Person  Provider:   Kate Sable, MD   Other Instructions      Signed, Kate Sable, MD  09/26/2020 4:29 PM    [place]

## 2020-09-26 NOTE — Patient Instructions (Signed)
Medication Instructions:  Your physician recommends that you continue on your current medications as directed. Please refer to the Current Medication list given to you today.  *If you need a refill on your cardiac medications before your next appointment, please call your pharmacy*   Lab Work: None Ordered If you have labs (blood work) drawn today and your tests are completely normal, you will receive your results only by: Marland Kitchen MyChart Message (if you have MyChart) OR . A paper copy in the mail If you have any lab test that is abnormal or we need to change your treatment, we will call you to review the results.   Testing/Procedures:  Your physician has requested that you have an Echocardiogram Stress Test. Echocardiography is a painless test that uses sound waves to create images of your heart. It provides your doctor with information about the size and shape of your heart and how well your heart's chambers and valves are working. This procedure takes approximately one hour. There are no restrictions for this procedure.   - you may eat a light breakfast/ lunch prior to your procedure - no caffeine for 24 hours prior to your test (coffee, tea, soft drinks, or chocolate)  - no smoking/ vaping for 4 hours prior to your test - bring any inhalers with you to your test - wear comfortable clothing & tennis/ non-skid shoes to walk on the treadmill    Follow-Up: At [facility], you and your health needs are our priority.  As part of our continuing mission to provide you with exceptional heart care, we have created designated Provider Care Teams.  These Care Teams include your primary Cardiologist (physician) and Advanced Practice Providers (APPs -  Physician Assistants and Nurse Practitioners) who all work together to provide you with the care you need, when you need it.  We recommend signing up for the patient portal called "MyChart".  Sign up information is provided on this After Visit  Summary.  MyChart is used to connect with patients for Virtual Visits (Telemedicine).  Patients are able to view lab/test results, encounter notes, upcoming appointments, etc.  Non-urgent messages can be sent to your provider as well.   To learn more about what you can do with MyChart, go to [email or web address].    Your next appointment:   Follow up after Stress Echocardiogram   The format for your next appointment:   In Person  Provider:   Kate Sable, MD   Other Instructions

## 2020-09-28 ENCOUNTER — Other Ambulatory Visit: Payer: Self-pay | Admitting: Internal Medicine

## 2020-10-20 DIAGNOSIS — D485 Neoplasm of uncertain behavior of skin: Secondary | ICD-10-CM | POA: Diagnosis not present

## 2020-10-20 DIAGNOSIS — C44612 Basal cell carcinoma of skin of right upper limb, including shoulder: Secondary | ICD-10-CM | POA: Diagnosis not present

## 2020-10-20 DIAGNOSIS — D225 Melanocytic nevi of trunk: Secondary | ICD-10-CM | POA: Diagnosis not present

## 2020-10-20 DIAGNOSIS — L821 Other seborrheic keratosis: Secondary | ICD-10-CM | POA: Diagnosis not present

## 2020-10-20 DIAGNOSIS — D2262 Melanocytic nevi of left upper limb, including shoulder: Secondary | ICD-10-CM | POA: Diagnosis not present

## 2020-10-20 DIAGNOSIS — C44311 Basal cell carcinoma of skin of nose: Secondary | ICD-10-CM | POA: Diagnosis not present

## 2020-10-20 DIAGNOSIS — D2261 Melanocytic nevi of right upper limb, including shoulder: Secondary | ICD-10-CM | POA: Diagnosis not present

## 2020-10-23 ENCOUNTER — Other Ambulatory Visit
Admission: RE | Admit: 2020-10-23 | Discharge: 2020-10-23 | Disposition: A | Discharge status: Home / Self Care | Payer: BC Managed Care – PPO | Source: Ambulatory Visit | Attending: Cardiology | Admitting: Cardiology

## 2020-10-23 ENCOUNTER — Other Ambulatory Visit: Payer: Self-pay

## 2020-10-23 ENCOUNTER — Telehealth: Payer: Self-pay | Admitting: *Deleted

## 2020-10-23 ENCOUNTER — Other Ambulatory Visit: Payer: BC Managed Care – PPO

## 2020-10-23 DIAGNOSIS — Z20822 Contact with and (suspected) exposure to covid-19: Secondary | ICD-10-CM | POA: Insufficient documentation

## 2020-10-23 LAB — SARS CORONAVIRUS 2 (TAT 6-24 HRS): SARS Coronavirus 2: NEGATIVE

## 2020-10-23 NOTE — Telephone Encounter (Signed)
Patient made aware and rescheduled by scheduling staff.

## 2020-10-23 NOTE — Telephone Encounter (Signed)
Patient is scheduled for stress echo this morning; however, he did not have a COVID test. It was not mentioned on his AVS.  Attempted to reach patient to see if he could get a COVID test this morning and then come in tomorrow for the stress echo at 10:30 am.  No answer. Left message to call back.

## 2020-10-23 NOTE — Telephone Encounter (Signed)
No answer. Left message to call back.   

## 2020-10-24 ENCOUNTER — Other Ambulatory Visit: Payer: Self-pay

## 2020-10-24 ENCOUNTER — Ambulatory Visit (INDEPENDENT_AMBULATORY_CARE_PROVIDER_SITE_OTHER): Payer: BC Managed Care – PPO

## 2020-10-24 DIAGNOSIS — R0600 Dyspnea, unspecified: Secondary | ICD-10-CM

## 2020-10-30 ENCOUNTER — Other Ambulatory Visit: Payer: Self-pay

## 2020-10-30 ENCOUNTER — Encounter: Payer: Self-pay | Admitting: Cardiology

## 2020-10-30 ENCOUNTER — Ambulatory Visit (INDEPENDENT_AMBULATORY_CARE_PROVIDER_SITE_OTHER): Payer: BC Managed Care – PPO | Admitting: Cardiology

## 2020-10-30 VITALS — BP 130/78 | HR 82 | Ht 67.0 in | Wt 232.0 lb

## 2020-10-30 DIAGNOSIS — E7800 Pure hypercholesterolemia, unspecified: Secondary | ICD-10-CM | POA: Diagnosis not present

## 2020-10-30 DIAGNOSIS — I10 Essential (primary) hypertension: Secondary | ICD-10-CM

## 2020-10-30 DIAGNOSIS — R0600 Dyspnea, unspecified: Secondary | ICD-10-CM

## 2020-10-30 DIAGNOSIS — Z6836 Body mass index (BMI) 36.0-36.9, adult: Secondary | ICD-10-CM | POA: Diagnosis not present

## 2020-10-30 NOTE — Progress Notes (Signed)
Cardiology Office Note:    Date:  10/30/2020   ID:  Christopher Vazquez, DOB 1966/03/13, MRN [id number]  PCP:  Christopher Mc, MD  Cardiologist:  Christopher Sable, MD  Electrophysiologist:  None   Referring MD: Christopher Mc, MD   Chief Complaint  Patient presents with  . Follow-up    F/U after stress echo    History of Present Illness:    Christopher Vazquez is a 54 y.o. male with a hx of hypertension, hyperlipidemia, diabetes who presents for follow-up.  He was last seen due to shortness of breath with exertion.  Also had complaints of increased heart rates with exertion such as going up stairs.  Previous echocardiogram showed normal systolic and diastolic function.  Stress echocardiogram was performed to evaluate any significant wall motion abnormalities and also arrhythmias.  Now presents for results.  He feels okay, has no new complaints or concerns at this time.  Prior notes His father has a history of abdominal aortic aneurysm and also thoracic aortic aneurysm.  His dad was a smoker and had an MI at age 40.  Echocardiogram 09/2019 showed normal systolic and diastolic function, EF 60 to 65%   Past Medical History:  Diagnosis Date  . 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 Complication of anesthesia    with tonsillectomy sever swelling of throat, possible laryngiospasm.  . Diabetes mellitus without complication ([place])   . GERD (gastroesophageal reflux disease)   . Hyperlipidemia   . Hypertension     Past Surgical History:  Procedure Laterality Date  . BIOPSY OF SKIN SUBCUTANEOUS TISSUE AND/OR MUCOUS MEMBRANE     nose  . CHOLESTEATOMA EXCISION Left 2000   left ear  . COLONOSCOPY WITH PROPOFOL N/A 11/17/2017   Procedure: COLONOSCOPY WITH PROPOFOL;  Surgeon: Lin Landsman, MD;  Location: [facility] ENDOSCOPY;  Service: Gastroenterology;  Laterality: N/A;  . ESOPHAGOGASTRODUODENOSCOPY (EGD) WITH PROPOFOL N/A 09/16/2020   Procedure: ESOPHAGOGASTRODUODENOSCOPY (EGD) WITH PROPOFOL;   Surgeon: Lin Landsman, MD;  Location: [facility] ENDOSCOPY;  Service: Gastroenterology;  Laterality: N/A;  . HERNIA REPAIR    . KNEE ARTHROSCOPY WITH MEDIAL MENISECTOMY Left 07/01/2016   Procedure: left knee arthroscopy with partial medial menisectomy;  Surgeon: Christopher Knows, MD;  Location: [facility] ORS;  Service: Orthopedics;  Laterality: Left;  . TONSILLECTOMY AND ADENOIDECTOMY    . UMBILICAL HERNIA REPAIR N/A 08/20/2019   Procedure: LAPAROSCOPIC ASSISTED UMBILICAL HERNIA REPAIR WITH MESH;  Surgeon: Christopher Hausen, MD;  Location: [facility] ORS;  Service: General;  Laterality: N/A;    Current Medications: Current Meds  Medication Sig  . aspirin 81 MG tablet Take 81 mg by mouth at bedtime.  . blood glucose meter kit and supplies Use to check blood sugar once daily.  . fenofibrate 160 MG tablet TAKE 1 TABLET DAILY  . metFORMIN (GLUCOPHAGE) 500 MG tablet Take 1 tablet (500 mg total) by mouth 2 (two) times daily with a meal.  . montelukast (SINGULAIR) 10 MG tablet TAKE 1 TABLET AT BEDTIME  . pantoprazole (PROTONIX) 40 MG tablet Take 1 tablet (40 mg total) by mouth 2 (two) times daily before a meal.  . sildenafil (REVATIO) 20 MG tablet TAKE 1 TABLET BY MOUTH THREE TIMES DAILY  . simvastatin (ZOCOR) 20 MG tablet TAKE 1 TABLET DAILY AT BEDTIME  . telmisartan-hydrochlorothiazide (MICARDIS HCT) 80-25 MG tablet TAKE 1 TABLET DAILY  . traZODone (DESYREL) 100 MG tablet TAKE 1 TABLET AT BEDTIME AS NEEDED FOR SLEEP     Allergies:  Scopolamine, Shellfish allergy, and Niacin and related   Social History   Socioeconomic History  . Marital status: Married    Spouse name: Not on file  . Number of children: 3  . Years of education: Not on file  . Highest education level: Not on file  Occupational History  . Occupation: Buyer, retail: [employer]  Tobacco Use  . Smoking status: Never Smoker  . Smokeless tobacco: Never Used  Vaping Use  . Vaping Use: Never used  Substance and Sexual Activity  .  Alcohol use: Not Currently  . Drug use: No  . Sexual activity: Yes    Birth control/protection: None    Comment: Married  Other Topics Concern  . Not on file  Social History Narrative  . Not on file   Social Determinants of Health   Financial Resource Strain: Not on file  Food Insecurity: Not on file  Transportation Needs: Not on file  Physical Activity: Not on file  Stress: Not on file  Social Connections: Not on file     Family History: The patient's family history includes AAA (abdominal aortic aneurysm) in his father; Aortic aneurysm (age of onset: 82) in his father; Diabetes in his mother; Heart disease (age of onset: 54) in his father; Hypertension in his mother; Uterine cancer in his mother.  ROS:   Please see the history of present illness.     All other systems reviewed and are negative.  EKGs/Labs/Other Studies Reviewed:    The following studies were reviewed today:  EKG:  EKG not ordered today.    Recent Labs: 05/26/2020: Hemoglobin 15.0; Platelets 246; TSH 3.30 09/04/2020: ALT 49; BUN 17; Creatinine, Ser 1.39; Potassium 4.2; Sodium 138  Recent Lipid Panel    Component Value Date/Time   CHOL 126 09/04/2020 1146   TRIG 256.0 (H) 09/04/2020 1146   HDL 27.10 (L) 09/04/2020 1146   CHOLHDL 5 09/04/2020 1146   VLDL 51.2 (H) 09/04/2020 1146   LDLCALC 65 05/26/2020 0000   LDLCALC 63 12/08/2018 1556   LDLDIRECT 78.0 09/04/2020 1146    Physical Exam:    VS:  BP 130/78 (BP Location: Left Arm, Patient Position: Sitting, Cuff Size: Normal)   Pulse 82   Ht _0  (1.702 m)   Wt 232 lb (105.2 kg)   SpO2 98%   BMI 36.34 kg/m     Wt Readings from Last 3 Encounters:  10/30/20 232 lb (105.2 kg)  09/26/20 228 lb 8 oz (103.6 kg)  09/16/20 226 lb (102.5 kg)     GEN:  Well nourished, well developed in no acute distress HEENT: Normal NECK: No JVD; No carotid bruits LYMPHATICS: No lymphadenopathy CARDIAC: RRR, no murmurs, rubs, gallops RESPIRATORY:  Clear to  auscultation without rales, wheezing or rhonchi  ABDOMEN: Soft, non-tender, non-distended MUSCULOSKELETAL:  No edema; No deformity  SKIN: Warm and dry NEUROLOGIC:  Alert and oriented x 3 PSYCHIATRIC:  Normal affect   ASSESSMENT:    1. Dyspnea, unspecified type   2. Essential hypertension   3. Pure hypercholesterolemia   4. BMI 36.0-36.9,adult      PLAN:    In order of problems listed above:  1. Dyspnea on exertion and tachycardia with exertion.  Has risk factors of hypertension, diabetes, hyperlipidemia.  Transthoracic echo showed normal systolic and diastolic function.  Stress echocardiogram showed no significant wall motion abnormalities, no significant arrhythmias noted.  Patient experienced shortness of breath which resolved quickly with rest.  Symptoms likely attributed to  deconditioning and obesity.  Graduated exercising advised. 2. Hypertension, BP controlled.  Continue telmisartan HCTZ 3. Hyperlipidemia, on statin 4. Obesity, weight loss, exercise, low-calorie diet advised.  Follow-up in 6 months   Medication Adjustments/Labs and Tests Ordered: Current medicines are reviewed at length with the patient today.  Concerns regarding medicines are outlined above.  No orders of the defined types were placed in this encounter.  No orders of the defined types were placed in this encounter.   Patient Instructions  Medication Instructions:  Your physician recommends that you continue on your current medications as directed. Please refer to the Current Medication list given to you today.  *If you need a refill on your cardiac medications before your next appointment, please call your pharmacy*   Lab Work: None Ordered If you have labs (blood work) drawn today and your tests are completely normal, you will receive your results only by: Marland Kitchen MyChart Message (if you have MyChart) OR . A paper copy in the mail If you have any lab test that is abnormal or we need to change your  treatment, we will call you to review the results.   Testing/Procedures: None Ordered   Follow-Up: At [facility], you and your health needs are our priority.  As part of our continuing mission to provide you with exceptional heart care, we have created designated Provider Care Teams.  These Care Teams include your primary Cardiologist (physician) and Advanced Practice Providers (APPs -  Physician Assistants and Nurse Practitioners) who all work together to provide you with the care you need, when you need it.  We recommend signing up for the patient portal called "MyChart".  Sign up information is provided on this After Visit Summary.  MyChart is used to connect with patients for Virtual Visits (Telemedicine).  Patients are able to view lab/test results, encounter notes, upcoming appointments, etc.  Non-urgent messages can be sent to your provider as well.   To learn more about what you can do with MyChart, go to [email or web address].    Your next appointment:   6 month(s)  The format for your next appointment:   In Person  Provider:   Kate Sable, MD   Other Instructions      Signed, Christopher Sable, MD  10/30/2020 12:52 PM    [place]

## 2020-10-30 NOTE — Patient Instructions (Signed)

## 2020-11-02 ENCOUNTER — Encounter: Payer: Self-pay | Admitting: Internal Medicine

## 2020-11-02 DIAGNOSIS — R0609 Other forms of dyspnea: Secondary | ICD-10-CM | POA: Insufficient documentation

## 2020-11-12 DIAGNOSIS — C44612 Basal cell carcinoma of skin of right upper limb, including shoulder: Secondary | ICD-10-CM | POA: Diagnosis not present

## 2020-11-17 ENCOUNTER — Other Ambulatory Visit: Payer: Self-pay | Admitting: Internal Medicine

## 2020-11-17 ENCOUNTER — Other Ambulatory Visit: Payer: Self-pay | Admitting: Gastroenterology

## 2020-11-19 DIAGNOSIS — C44311 Basal cell carcinoma of skin of nose: Secondary | ICD-10-CM | POA: Diagnosis not present

## 2020-11-19 DIAGNOSIS — L988 Other specified disorders of the skin and subcutaneous tissue: Secondary | ICD-10-CM | POA: Diagnosis not present

## 2020-11-19 DIAGNOSIS — L578 Other skin changes due to chronic exposure to nonionizing radiation: Secondary | ICD-10-CM | POA: Diagnosis not present

## 2020-11-19 DIAGNOSIS — L814 Other melanin hyperpigmentation: Secondary | ICD-10-CM | POA: Diagnosis not present

## 2020-11-25 ENCOUNTER — Other Ambulatory Visit: Payer: Self-pay | Admitting: Internal Medicine

## 2020-12-02 IMAGING — CR DG CHEST 2V
2 series · 2 of 2 positions shown · non-contrast
Comparison: Chest x-ray dated 12/08/2018.

CLINICAL DATA: Chest pain, increasing LEFT-sided chest pain
radiating towards shoulder for 2 days.

EXAM:
CHEST - 2 VIEW

[chest pa]
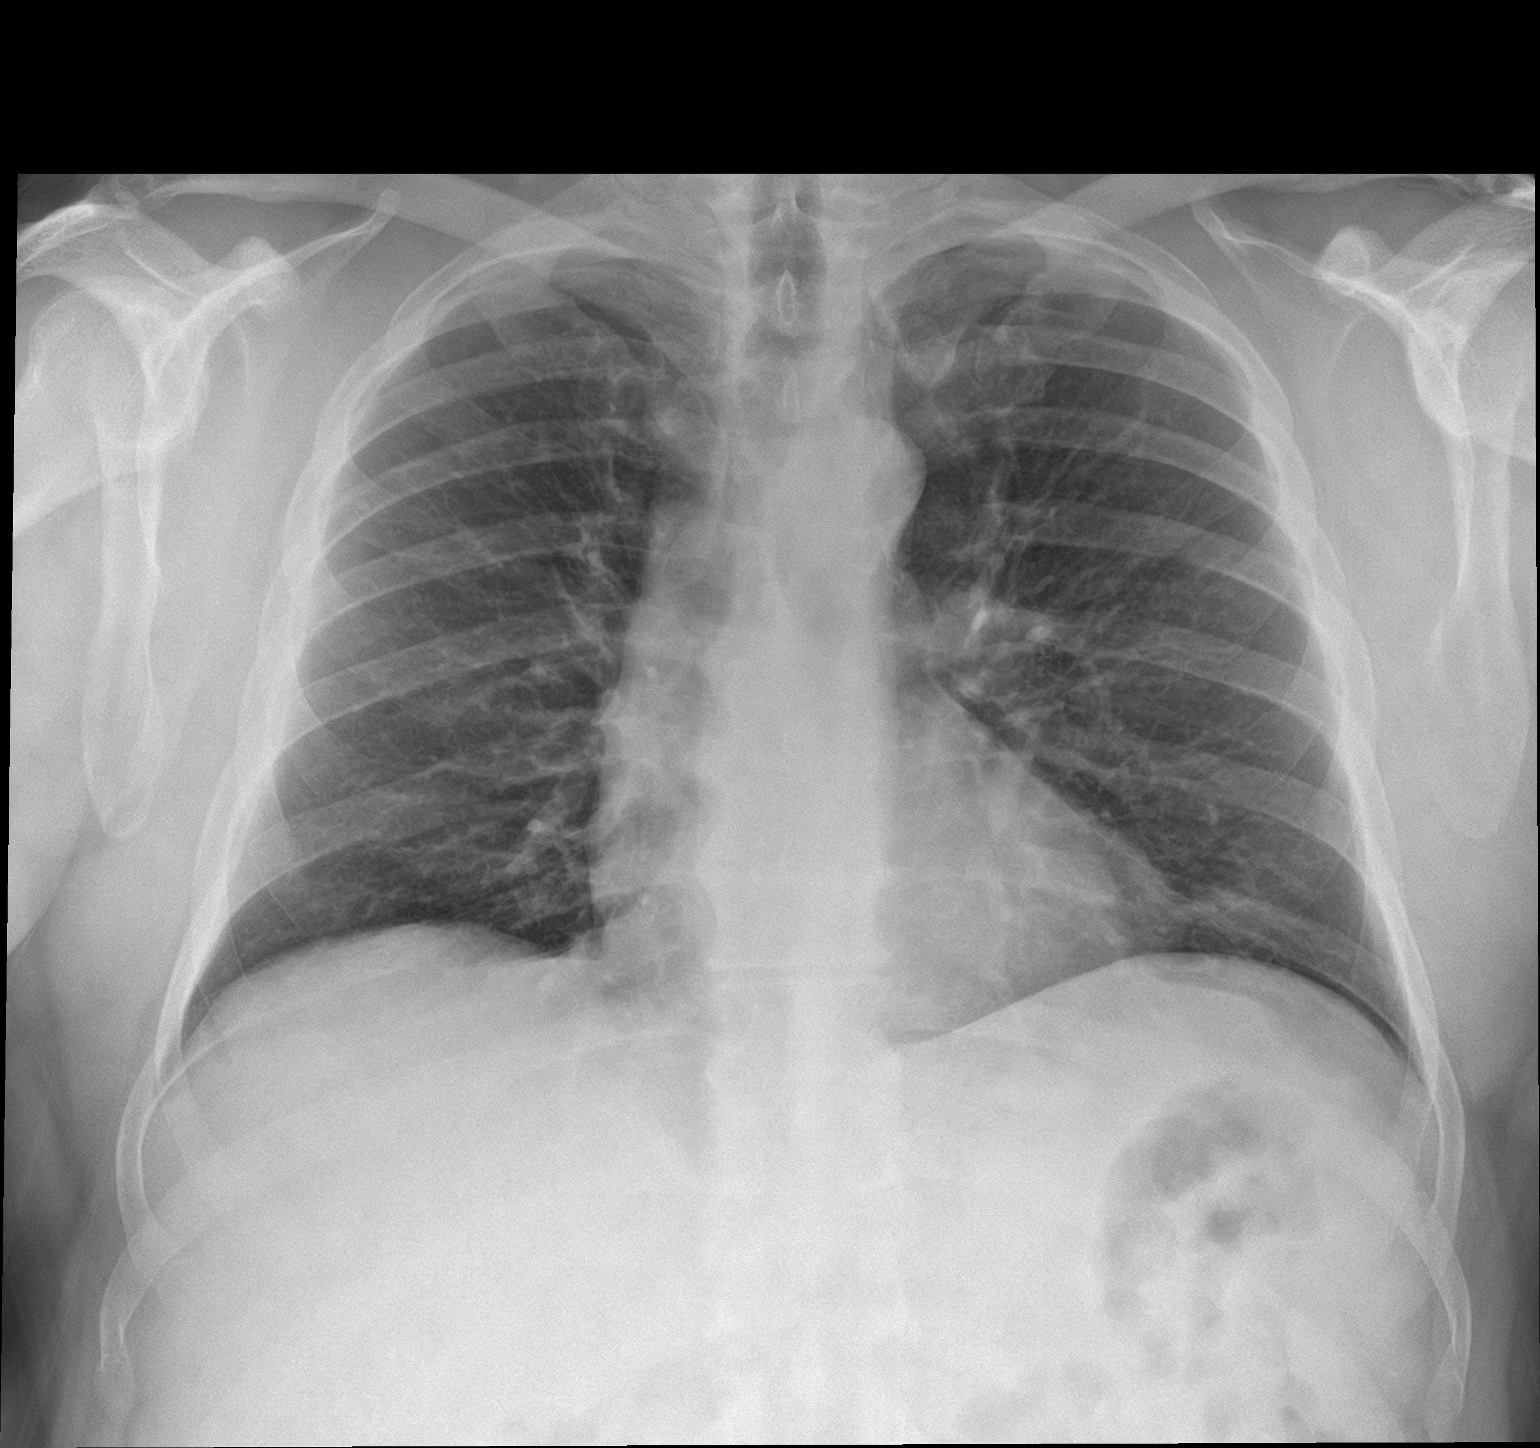

[chest lat]
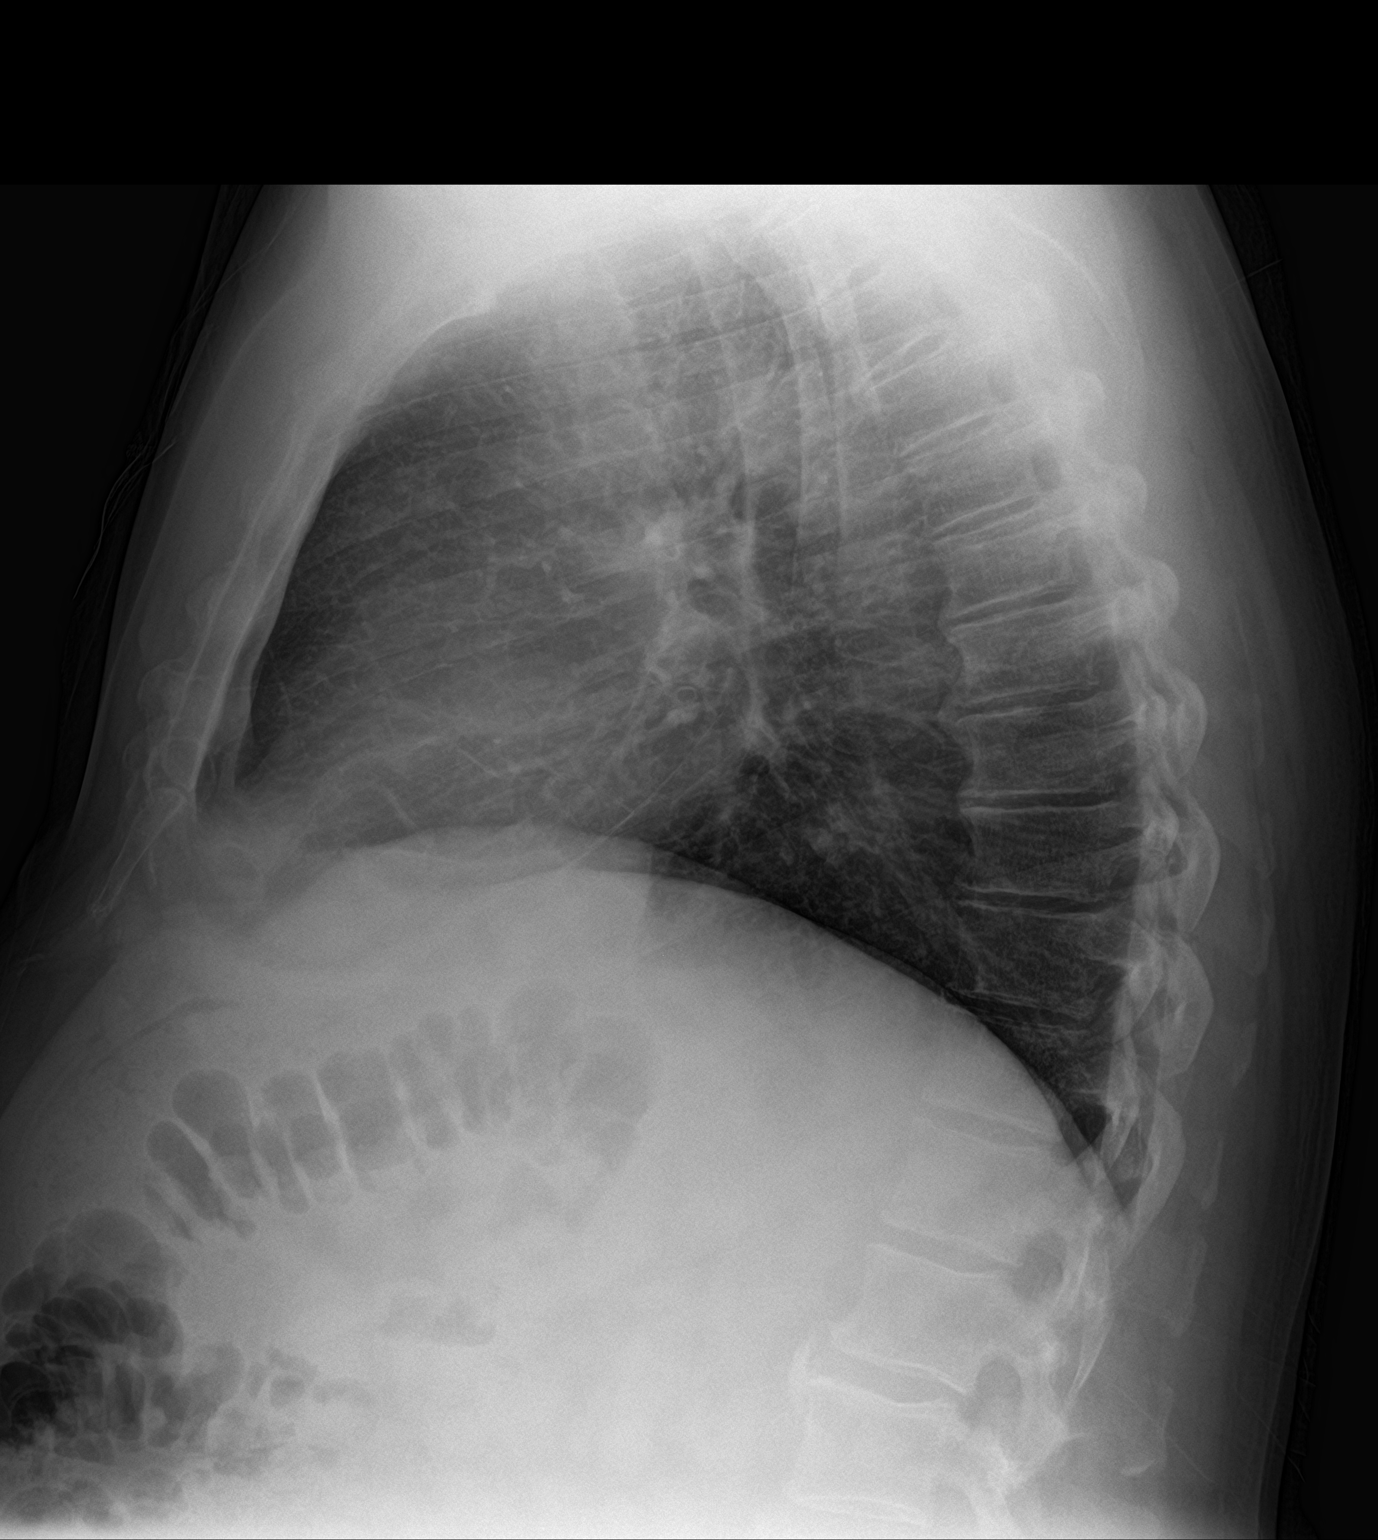

[2 of 2 positions shown; findings below may reference images not displayed]

FINDINGS: Cardiomediastinal silhouette is within normal limits in size and
configuration. Lungs are clear. Lung volumes are normal. No evidence
of pneumonia. No pleural effusion. No pneumothorax seen. Osseous
structures about the chest are unremarkable.
IMPRESSION: Normal chest x-ray. No evidence of pneumonia or pulmonary edema.

## 2020-12-08 DIAGNOSIS — Z20822 Contact with and (suspected) exposure to covid-19: Secondary | ICD-10-CM | POA: Diagnosis not present

## 2020-12-08 DIAGNOSIS — Z03818 Encounter for observation for suspected exposure to other biological agents ruled out: Secondary | ICD-10-CM | POA: Diagnosis not present

## 2020-12-09 ENCOUNTER — Ambulatory Visit: Payer: BC Managed Care – PPO | Admitting: Gastroenterology

## 2021-01-10 ENCOUNTER — Other Ambulatory Visit: Payer: Self-pay | Admitting: Internal Medicine

## 2021-01-10 DIAGNOSIS — I10 Essential (primary) hypertension: Secondary | ICD-10-CM

## 2021-01-10 DIAGNOSIS — E119 Type 2 diabetes mellitus without complications: Secondary | ICD-10-CM

## 2021-01-16 ENCOUNTER — Other Ambulatory Visit: Payer: Self-pay

## 2021-01-16 ENCOUNTER — Other Ambulatory Visit (INDEPENDENT_AMBULATORY_CARE_PROVIDER_SITE_OTHER): Payer: BC Managed Care – PPO

## 2021-01-16 DIAGNOSIS — E119 Type 2 diabetes mellitus without complications: Secondary | ICD-10-CM | POA: Diagnosis not present

## 2021-01-16 DIAGNOSIS — I10 Essential (primary) hypertension: Secondary | ICD-10-CM

## 2021-01-16 LAB — LIPID PANEL
Cholesterol: 110 mg/dL (ref 0–200)
HDL: 28.4 mg/dL — ABNORMAL LOW (ref >39.00)
LDL Cholesterol: 49 mg/dL (ref 0–99)
NonHDL: 81.52
Total CHOL/HDL Ratio: 4
Triglycerides: 164 mg/dL — ABNORMAL HIGH (ref 0.0–149.0)
VLDL: 32.8 mg/dL (ref 0.0–40.0)

## 2021-01-16 LAB — COMPREHENSIVE METABOLIC PANEL
ALT: 37 U/L (ref 0–53)
AST: 26 U/L (ref 0–37)
Albumin: 4.4 g/dL (ref 3.5–5.2)
Alkaline Phosphatase: 41 U/L (ref 39–117)
BUN: 20 mg/dL (ref 6–23)
CO2: 30 mEq/L (ref 19–32)
Calcium: 9.7 mg/dL (ref 8.4–10.5)
Chloride: 102 mEq/L (ref 96–112)
Creatinine, Ser: 1.35 mg/dL (ref 0.40–1.50)
GFR: 59.81 mL/min — ABNORMAL LOW (ref >60.00)
Glucose, Bld: 100 mg/dL — ABNORMAL HIGH (ref 70–99)
Potassium: 4.3 mEq/L (ref 3.5–5.1)
Sodium: 139 mEq/L (ref 135–145)
Total Bilirubin: 0.5 mg/dL (ref 0.2–1.2)
Total Protein: 7 g/dL (ref 6.0–8.3)

## 2021-01-16 LAB — HEMOGLOBIN A1C: Hgb A1c MFr Bld: 6.2 % (ref 4.6–6.5)

## 2021-01-26 DIAGNOSIS — Z03818 Encounter for observation for suspected exposure to other biological agents ruled out: Secondary | ICD-10-CM | POA: Diagnosis not present

## 2021-01-26 DIAGNOSIS — Z20822 Contact with and (suspected) exposure to covid-19: Secondary | ICD-10-CM | POA: Diagnosis not present

## 2021-02-04 DIAGNOSIS — G4733 Obstructive sleep apnea (adult) (pediatric): Secondary | ICD-10-CM | POA: Diagnosis not present

## 2021-03-04 DIAGNOSIS — G4733 Obstructive sleep apnea (adult) (pediatric): Secondary | ICD-10-CM | POA: Diagnosis not present

## 2021-03-07 DIAGNOSIS — G4733 Obstructive sleep apnea (adult) (pediatric): Secondary | ICD-10-CM | POA: Diagnosis not present

## 2021-03-18 ENCOUNTER — Other Ambulatory Visit: Payer: Self-pay

## 2021-03-18 ENCOUNTER — Ambulatory Visit
Admission: RE | Admit: 2021-03-18 | Discharge: 2021-03-18 | Disposition: A | Discharge status: Home / Self Care | Payer: BC Managed Care – PPO | Source: Ambulatory Visit | Attending: Emergency Medicine | Admitting: Emergency Medicine

## 2021-03-18 VITALS — BP 121/70 | HR 90 | Temp 98.7°F | Resp 18

## 2021-03-18 DIAGNOSIS — Z20822 Contact with and (suspected) exposure to covid-19: Secondary | ICD-10-CM | POA: Diagnosis not present

## 2021-03-18 DIAGNOSIS — B349 Viral infection, unspecified: Secondary | ICD-10-CM

## 2021-03-18 MED ORDER — BENZONATATE 100 MG PO CAPS: 100.0000 mg | ORAL_CAPSULE | Freq: Three times a day (TID) | ORAL | 0 refills | Status: DC | PRN

## 2021-03-18 NOTE — ED Triage Notes (Addendum)
Patient c/o chest congestion, fever, and nasal congestion x 2 days.   Patient endorses temperature of 101 F at it's highest.   Patient endorses " my throat feels raw" and sinus pressure.   Patient denies ear pain.   Patient has taken 2 at home COVID-19 test with no relief of symptoms.   Patient has taken Advil with relief of symptoms. Last dose of Advil was at 0730 today.

## 2021-03-18 NOTE — Discharge Instructions (Addendum)
Your COVID and Influenza tests are pending.  You should self quarantine until the test results are back.    Take Tylenol or ibuprofen as needed for fever or discomfort.  Rest and keep yourself hydrated.    Follow-up with your primary care provider if your symptoms are not improving.     

## 2021-03-18 NOTE — ED Provider Notes (Signed)
Christopher Vazquez    CSN: [id number] Arrival date & time: 03/18/21  1422      History   Chief Complaint Chief Complaint  Patient presents with  . Nasal Congestion  . Fever    HPI Christopher Vazquez is a 55 y.o. male.   Patient presents with 2-day history of fever, nasal congestion, sinus pressure, runny nose, postnasal drip, sore throat, cough.  T-max 101.  Treatment attempted at home with Advil.  He denies rash, shortness of breath, vomiting, diarrhea, or other symptoms.  His medical history includes seasonal allergies, hypertension, diabetes, GERD.  The history is provided by the patient and medical records.    Past Medical History:  Diagnosis Date  . Cancer (HCC)    basal cell-nose  . Complication of anesthesia    with tonsillectomy sever swelling of throat, possible laryngiospasm.  . Diabetes mellitus without complication (HCC)   . GERD (gastroesophageal reflux disease)   . Hyperlipidemia   . Hypertension     Patient Active Problem List   Diagnosis Date Noted  . Exertional dyspnea 11/02/2020  . Esophageal dysphagia   . Disorder of bursae of shoulder region 09/08/2020  . 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 OSA (obstructive sleep apnea) 08/07/2020  . Witnessed apneic spells 07/12/2020  . Pain in joint of left shoulder 11/16/2019  . Newly diagnosed diabetes (HCC) 08/18/2015  . Obesity 02/09/2015  . Vitamin D deficiency 02/19/2014  . Seasonal allergic rhinitis 02/02/2014  . Dysphagia, unspecified(787.20) 03/07/2013  . Hepatic steatosis 03/07/2013  . Hyperlipidemia   . Essential hypertension     Past Surgical History:  Procedure Laterality Date  . BIOPSY OF SKIN SUBCUTANEOUS TISSUE AND/OR MUCOUS MEMBRANE     nose  . CHOLESTEATOMA EXCISION Left 2000   left ear  . COLONOSCOPY WITH PROPOFOL N/A 11/17/2017   Procedure: COLONOSCOPY WITH PROPOFOL;  Surgeon: Toney Reil, MD;  Location: [facility] ENDOSCOPY;  Service: Gastroenterology;  Laterality: N/A;   . ESOPHAGOGASTRODUODENOSCOPY (EGD) WITH PROPOFOL N/A 09/16/2020   Procedure: ESOPHAGOGASTRODUODENOSCOPY (EGD) WITH PROPOFOL;  Surgeon: Toney Reil, MD;  Location: [facility] ENDOSCOPY;  Service: Gastroenterology;  Laterality: N/A;  . HERNIA REPAIR    . KNEE ARTHROSCOPY WITH MEDIAL MENISECTOMY Left 07/01/2016   Procedure: left knee arthroscopy with partial medial menisectomy;  Surgeon: Kennedy Bucker, MD;  Location: [facility] ORS;  Service: Orthopedics;  Laterality: Left;  . TONSILLECTOMY AND ADENOIDECTOMY    . UMBILICAL HERNIA REPAIR N/A 08/20/2019   Procedure: LAPAROSCOPIC ASSISTED UMBILICAL HERNIA REPAIR WITH MESH;  Surgeon: Luretha Murphy, MD;  Location: [facility] ORS;  Service: General;  Laterality: N/A;       Home Medications    Prior to Admission medications   Medication Sig Start Date End Date Taking? Authorizing Provider  aspirin 81 MG tablet Take 81 mg by mouth at bedtime.   Yes [provider]  benzonatate (TESSALON) 100 MG capsule Take 1 capsule (100 mg total) by mouth 3 (three) times daily as needed for cough. 03/18/21  Yes Mickie Bail, NP  fenofibrate 160 MG tablet TAKE 1 TABLET DAILY 11/25/20  Yes Sherlene Shams, MD  metFORMIN (GLUCOPHAGE) 500 MG tablet Take 1 tablet (500 mg total) by mouth 2 (two) times daily with a meal. 08/18/20  Yes Sherlene Shams, MD  montelukast (SINGULAIR) 10 MG tablet TAKE 1 TABLET AT BEDTIME 05/01/20  Yes Sherlene Shams, MD  pantoprazole (PROTONIX) 40 MG tablet TAKE 1 TABLET BY MOUTH TWICE DAILY BEFORE A MEAL 11/17/20  Yes Vanga, Tally Due, MD  sildenafil (REVATIO) 20 MG tablet TAKE 1 TABLET BY MOUTH THREE TIMES DAILY 01/12/21  Yes Crecencio Mc, MD  simvastatin (ZOCOR) 20 MG tablet TAKE 1 TABLET DAILY AT BEDTIME 11/25/20  Yes Crecencio Mc, MD  telmisartan-hydrochlorothiazide (MICARDIS HCT) 80-25 MG tablet TAKE 1 TABLET DAILY 06/16/20  Yes Crecencio Mc, MD  traZODone (DESYREL) 100 MG tablet TAKE 1 TABLET AT BEDTIME AS NEEDED FOR SLEEP 11/17/20   Yes Crecencio Mc, MD  blood glucose meter kit and supplies Use to check blood sugar once daily. 01/01/20   Crecencio Mc, MD    Family History Family History  Problem Relation Age of Onset  . Diabetes Mother   . Hypertension Mother   . Uterine cancer Mother   . Aortic aneurysm Father 65  . Heart disease Father 54  . AAA (abdominal aortic aneurysm) Father     Social History Social History   Tobacco Use  . Smoking status: Never Smoker  . Smokeless tobacco: Never Used  Vaping Use  . Vaping Use: Never used  Substance Use Topics  . Alcohol use: Not Currently  . Drug use: No     Allergies   Scopolamine, Shellfish allergy, and Niacin and related   Review of Systems Review of Systems  Constitutional: Positive for fever. Negative for chills.  HENT: Positive for congestion, postnasal drip, rhinorrhea, sinus pressure and sore throat. Negative for ear pain.   Respiratory: Positive for cough. Negative for shortness of breath.   Cardiovascular: Negative for chest pain and palpitations.  Gastrointestinal: Negative for abdominal pain, diarrhea and vomiting.  Skin: Negative for color change and rash.  All other systems reviewed and are negative.    Physical Exam Triage Vital Signs ED Triage Vitals  Enc Vitals Group     BP      Pulse      Resp      Temp      Temp src      SpO2      Weight      Height      Head Circumference      Peak Flow      Pain Score      Pain Loc      Pain Edu?      Excl. in [place]?    No data found.  Updated Vital Signs BP 121/70 (BP Location: Left Arm)   Pulse 90   Temp 98.7 F (37.1 C) (Oral)   Resp 18   SpO2 96%   Visual Acuity Right Eye Distance:   Left Eye Distance:   Bilateral Distance:    Right Eye Near:   Left Eye Near:    Bilateral Near:     Physical Exam Vitals and nursing note reviewed.  Constitutional:      General: He is not in acute distress.    Appearance: He is well-developed. He is ill-appearing.  HENT:      Head: Normocephalic and atraumatic.     Right Ear: Tympanic membrane normal.     Left Ear: Tympanic membrane normal.     Nose: Congestion and rhinorrhea present.     Mouth/Throat:     Mouth: Mucous membranes are moist.     Pharynx: Oropharynx is clear.  Eyes:     Conjunctiva/sclera: Conjunctivae normal.  Cardiovascular:     Rate and Rhythm: Normal rate and regular rhythm.     Heart sounds: Normal heart sounds.  Pulmonary:  Effort: Pulmonary effort is normal. No respiratory distress.     Breath sounds: Normal breath sounds.  Abdominal:     Palpations: Abdomen is soft.     Tenderness: There is no abdominal tenderness.  Musculoskeletal:     Cervical back: Neck supple.  Skin:    General: Skin is warm and dry.  Neurological:     General: No focal deficit present.     Mental Status: He is alert and oriented to person, place, and time.     Gait: Gait normal.  Psychiatric:        Mood and Affect: Mood normal.        Behavior: Behavior normal.      UC Treatments / Results  Labs (all labs ordered are listed, but only abnormal results are displayed) Labs Reviewed  COVID-19, FLU A+B NAA    EKG   Radiology No results found.  Procedures Procedures (including critical care time)  Medications Ordered in UC Medications - No data to display  Initial Impression / Assessment and Plan / UC Course  I have reviewed the triage vital signs and the nursing notes.  Pertinent labs & imaging results that were available during my care of the patient were reviewed by me and considered in my medical decision making (see chart for details).   Viral illness.  Influenza and COVID pending.  Instructed patient to self quarantine until the test results are back.  Tessalon Perles as needed for cough.  Discussed symptomatic treatment including Tylenol or ibuprofen, rest, hydration.  Instructed patient to follow up with PCP if his symptoms are not improving.  Patient agrees to plan of  care.    Final Clinical Impressions(s) / UC Diagnoses   Final diagnoses:  Viral illness     Discharge Instructions     Your COVID and Influenza tests are pending.  You should self quarantine until the test results are back.    Take Tylenol or ibuprofen as needed for fever or discomfort.  Rest and keep yourself hydrated.    Follow-up with your primary care provider if your symptoms are not improving.       ED Prescriptions    Medication Sig Dispense Auth. Provider   benzonatate (TESSALON) 100 MG capsule Take 1 capsule (100 mg total) by mouth 3 (three) times daily as needed for cough. 21 capsule Sharion Balloon, NP     PDMP not reviewed this encounter.   Sharion Balloon, NP 03/18/21 1459

## 2021-03-19 LAB — COVID-19, FLU A+B NAA
Influenza A, NAA: NOT DETECTED
Influenza B, NAA: NOT DETECTED
SARS-CoV-2, NAA: NOT DETECTED

## 2021-03-22 ENCOUNTER — Other Ambulatory Visit: Payer: Self-pay | Admitting: Internal Medicine

## 2021-03-31 DIAGNOSIS — G4733 Obstructive sleep apnea (adult) (pediatric): Secondary | ICD-10-CM | POA: Diagnosis not present

## 2021-04-08 ENCOUNTER — Other Ambulatory Visit: Payer: Self-pay | Admitting: Internal Medicine

## 2021-04-11 ENCOUNTER — Other Ambulatory Visit: Payer: Self-pay

## 2021-04-13 MED ORDER — BLOOD GLUCOSE METER KIT: PACK | 0 refills | Status: DC

## 2021-04-15 MED ORDER — BLOOD GLUCOSE METER KIT: PACK | 0 refills | Status: DC

## 2021-04-15 NOTE — Addendum Note (Signed)
Addended by: Elpidio Galea T on: 04/15/2021 09:26 AM   Modules accepted: Orders

## 2021-04-30 ENCOUNTER — Ambulatory Visit (INDEPENDENT_AMBULATORY_CARE_PROVIDER_SITE_OTHER): Payer: BC Managed Care – PPO | Admitting: Cardiology

## 2021-04-30 ENCOUNTER — Other Ambulatory Visit: Payer: Self-pay

## 2021-04-30 ENCOUNTER — Encounter: Payer: Self-pay | Admitting: Cardiology

## 2021-04-30 VITALS — BP 104/68 | HR 65 | Ht 67.0 in | Wt 220.4 lb

## 2021-04-30 DIAGNOSIS — I10 Essential (primary) hypertension: Secondary | ICD-10-CM

## 2021-04-30 DIAGNOSIS — R0600 Dyspnea, unspecified: Secondary | ICD-10-CM | POA: Diagnosis not present

## 2021-04-30 DIAGNOSIS — E7800 Pure hypercholesterolemia, unspecified: Secondary | ICD-10-CM | POA: Diagnosis not present

## 2021-04-30 DIAGNOSIS — Z6834 Body mass index (BMI) 34.0-34.9, adult: Secondary | ICD-10-CM | POA: Diagnosis not present

## 2021-04-30 NOTE — Patient Instructions (Signed)
Medication Instructions:  Your physician recommends that you continue on your current medications as directed. Please refer to the Current Medication list given to you today.  *If you need a refill on your cardiac medications before your next appointment, please call your pharmacy*   Lab Work: None ordered If you have labs (blood work) drawn today and your tests are completely normal, you will receive your results only by: [place] (if you have MyChart) OR A paper copy in the mail If you have any lab test that is abnormal or we need to change your treatment, we will call you to review the results.   Testing/Procedures: None ordered   Follow-Up: At [facility], you and your health needs are our priority.  As part of our continuing mission to provide you with exceptional heart care, we have created designated Provider Care Teams.  These Care Teams include your primary Cardiologist (physician) and Advanced Practice Providers (APPs -  Physician Assistants and Nurse Practitioners) who all work together to provide you with the care you need, when you need it.  We recommend signing up for the patient portal called "MyChart".  Sign up information is provided on this After Visit Summary.  MyChart is used to connect with patients for Virtual Visits (Telemedicine).  Patients are able to view lab/test results, encounter notes, upcoming appointments, etc.  Non-urgent messages can be sent to your provider as well.   To learn more about what you can do with MyChart, go to [email or web address].    Your next appointment:   6 month(s)  The format for your next appointment:   In Person  Provider:   You may see Kate Sable, MD or one of the following Advanced Practice Providers on your designated Care Team:   Murray Hodgkins, NP Christell Faith, PA-C Marrianne Mood, PA-C Cadence Courtland, [place] Laurann Montana, NP   Other Instructions N/A

## 2021-04-30 NOTE — Progress Notes (Signed)
Cardiology Office Note:    Date:  04/30/2021   ID:  Christopher Vazquez, DOB Mar 14, 1966, MRN [id number]  PCP:  Crecencio Mc, MD  Cardiologist:  Kate Sable, MD  Electrophysiologist:  None   Referring MD: Crecencio Mc, MD   Chief Complaint  Patient presents with   6 month follow up     Patient c/o rapid heartbeats more frequently within the past month. Medications reviewed by the patient verbally.     History of Present Illness:    Christopher Vazquez is a 55 y.o. male with a hx of hypertension, hyperlipidemia, diabetes who presents for follow-up.    He was previously seen due to shortness of breath with exertion.  Stress test and echocardiogram were normal.  He was also obese at the time.  Symptoms were deemed secondary to deconditioning, weight loss, exercise advised.    He states exercising, has lost 12 pounds since last office visit.  He feels much better, although he notes he has elevated heart rates after he exerts himself which typically take longer to get back to baseline.  Otherwise he breathes much better, denies chest pain, his diabetes has been better controlled with weight loss.   Prior notes His father has a history of abdominal aortic aneurysm and also thoracic aortic aneurysm.  His dad was a smoker and had an MI at age 30.  Echocardiogram 09/2019 showed normal systolic and diastolic function, EF 60 to 65% Stress echocardiogram 10/2020, no evidence for ischemia.   Past Medical History:  Diagnosis Date   Cancer ([place])    basal cell-nose   Complication of anesthesia    with tonsillectomy sever swelling of throat, possible laryngiospasm.   Diabetes mellitus without complication (HCC)    GERD (gastroesophageal reflux disease)    Hyperlipidemia    Hypertension     Past Surgical History:  Procedure Laterality Date   BIOPSY OF SKIN SUBCUTANEOUS TISSUE AND/OR MUCOUS MEMBRANE     nose   CHOLESTEATOMA EXCISION Left 2000   left ear   COLONOSCOPY WITH PROPOFOL  N/A 11/17/2017   Procedure: COLONOSCOPY WITH PROPOFOL;  Surgeon: Lin Landsman, MD;  Location: [facility] ENDOSCOPY;  Service: Gastroenterology;  Laterality: N/A;   ESOPHAGOGASTRODUODENOSCOPY (EGD) WITH PROPOFOL N/A 09/16/2020   Procedure: ESOPHAGOGASTRODUODENOSCOPY (EGD) WITH PROPOFOL;  Surgeon: Lin Landsman, MD;  Location: [facility] ENDOSCOPY;  Service: Gastroenterology;  Laterality: N/A;   HERNIA REPAIR     KNEE ARTHROSCOPY WITH MEDIAL MENISECTOMY Left 07/01/2016   Procedure: left knee arthroscopy with partial medial menisectomy;  Surgeon: Hessie Knows, MD;  Location: [facility] ORS;  Service: Orthopedics;  Laterality: Left;   TONSILLECTOMY AND ADENOIDECTOMY     UMBILICAL HERNIA REPAIR N/A 08/20/2019   Procedure: LAPAROSCOPIC ASSISTED UMBILICAL HERNIA REPAIR WITH MESH;  Surgeon: Johnathan Hausen, MD;  Location: [facility] ORS;  Service: General;  Laterality: N/A;    Current Medications: Current Meds  Medication Sig   aspirin 81 MG tablet Take 81 mg by mouth at bedtime.   blood glucose meter kit and supplies Use to check blood sugar once daily.   fenofibrate 160 MG tablet TAKE 1 TABLET DAILY   metFORMIN (GLUCOPHAGE) 500 MG tablet Take 1 tablet (500 mg total) by mouth 2 (two) times daily with a meal.   montelukast (SINGULAIR) 10 MG tablet TAKE 1 TABLET AT BEDTIME   pantoprazole (PROTONIX) 40 MG tablet TAKE 1 TABLET BY MOUTH TWICE DAILY BEFORE A MEAL   sildenafil (REVATIO) 20 MG tablet TAKE 1 TABLET BY MOUTH  THREE TIMES DAILY   simvastatin (ZOCOR) 20 MG tablet TAKE 1 TABLET DAILY AT BEDTIME   telmisartan-hydrochlorothiazide (MICARDIS HCT) 80-25 MG tablet TAKE 1 TABLET DAILY   traZODone (DESYREL) 100 MG tablet TAKE 1 TABLET AT BEDTIME AS NEEDED FOR SLEEP     Allergies:   Niacin, Shellfish allergy, Scopolamine, and Niacin and related   Social History   Socioeconomic History   Marital status: Married    Spouse name: Not on file   Number of children: 3   Years of education: Not on file   Highest  education level: Not on file  Occupational History   Occupation: Buyer, retail: [employer]  Tobacco Use   Smoking status: Never   Smokeless tobacco: Never  Vaping Use   Vaping Use: Never used  Substance and Sexual Activity   Alcohol use: Not Currently   Drug use: No   Sexual activity: Yes    Birth control/protection: None    Comment: Married  Other Topics Concern   Not on file  Social History Narrative   Not on file   Social Determinants of Health   Financial Resource Strain: Not on file  Food Insecurity: Not on file  Transportation Needs: Not on file  Physical Activity: Not on file  Stress: Not on file  Social Connections: Not on file     Family History: The patient's family history includes AAA (abdominal aortic aneurysm) in his father; Aortic aneurysm (age of onset: 96) in his father; Diabetes in his mother; Heart disease (age of onset: 45) in his father; Hypertension in his mother; Uterine cancer in his mother.  ROS:   Please see the history of present illness.     All other systems reviewed and are negative.  EKGs/Labs/Other Studies Reviewed:    The following studies were reviewed today:  EKG:  EKG not ordered today.    Recent Labs: 05/26/2020: Hemoglobin 15.0; Platelets 246; TSH 3.30 01/16/2021: ALT 37; BUN 20; Creatinine, Ser 1.35; Potassium 4.3; Sodium 139  Recent Lipid Panel    Component Value Date/Time   CHOL 110 01/16/2021 0851   TRIG 164.0 (H) 01/16/2021 0851   HDL 28.40 (L) 01/16/2021 0851   CHOLHDL 4 01/16/2021 0851   VLDL 32.8 01/16/2021 0851   LDLCALC 49 01/16/2021 0851   LDLCALC 63 12/08/2018 1556   LDLDIRECT 78.0 09/04/2020 1146    Physical Exam:    VS:  BP 104/68 (BP Location: Left Arm, Patient Position: Sitting, Cuff Size: Normal)   Pulse 65   Ht _0  (1.702 m)   Wt 220 lb 6 oz (100 kg)   SpO2 98%   BMI 34.52 kg/m     Wt Readings from Last 3 Encounters:  04/30/21 220 lb 6 oz (100 kg)  10/30/20 232 lb (105.2 kg)   09/26/20 228 lb 8 oz (103.6 kg)     GEN:  Well nourished, well developed in no acute distress HEENT: Normal NECK: No JVD; No carotid bruits LYMPHATICS: No lymphadenopathy CARDIAC: RRR, no murmurs, rubs, gallops RESPIRATORY:  Clear to auscultation without rales, wheezing or rhonchi  ABDOMEN: Soft, non-tender, non-distended MUSCULOSKELETAL:  No edema; No deformity  SKIN: Warm and dry NEUROLOGIC:  Alert and oriented x 3 PSYCHIATRIC:  Normal affect   ASSESSMENT:    1. Dyspnea, unspecified type   2. Primary hypertension   3. Pure hypercholesterolemia   4. BMI 34.0-34.9,adult       PLAN:    In order of problems listed above:  Shortness of breath is much improved after 12 pound weight loss, and exercise.  Echocardiogram was normal, stress echo also normal.  Symptoms attributed to deconditioning and obesity.  Continue graduated exercising advised, continue weight loss. Hypertension, BP controlled.  Continue telmisartan HCTZ Hyperlipidemia, continue statin.  Plan to repeat fasting lipid profile 3 months with PCP.  Consider reducing dose of statin with weight loss. Obesity, patient congratulated on recent weight loss and exercise.  Continued weight loss advised.  Follow-up in 6 months   Medication Adjustments/Labs and Tests Ordered: Current medicines are reviewed at length with the patient today.  Concerns regarding medicines are outlined above.  Orders Placed This Encounter  Procedures   EKG 12-Lead    No orders of the defined types were placed in this encounter.   Patient Instructions  Medication Instructions:  Your physician recommends that you continue on your current medications as directed. Please refer to the Current Medication list given to you today.  *If you need a refill on your cardiac medications before your next appointment, please call your pharmacy*   Lab Work: None ordered If you have labs (blood work) drawn today and your tests are completely normal,  you will receive your results only by: [place] (if you have MyChart) OR A paper copy in the mail If you have any lab test that is abnormal or we need to change your treatment, we will call you to review the results.   Testing/Procedures: None ordered   Follow-Up: At [facility], you and your health needs are our priority.  As part of our continuing mission to provide you with exceptional heart care, we have created designated Provider Care Teams.  These Care Teams include your primary Cardiologist (physician) and Advanced Practice Providers (APPs -  Physician Assistants and Nurse Practitioners) who all work together to provide you with the care you need, when you need it.  We recommend signing up for the patient portal called "MyChart".  Sign up information is provided on this After Visit Summary.  MyChart is used to connect with patients for Virtual Visits (Telemedicine).  Patients are able to view lab/test results, encounter notes, upcoming appointments, etc.  Non-urgent messages can be sent to your provider as well.   To learn more about what you can do with MyChart, go to [email or web address].    Your next appointment:   6 month(s)  The format for your next appointment:   In Person  Provider:   You may see Kate Sable, MD or one of the following Advanced Practice Providers on your designated Care Team:   Murray Hodgkins, NP Christell Faith, PA-C Marrianne Mood, PA-C Cadence Beaver, [place] Laurann Montana, NP   Other Instructions N/A   Signed, Kate Sable, MD  04/30/2021 1:18 PM    [place]

## 2021-05-01 DIAGNOSIS — G4733 Obstructive sleep apnea (adult) (pediatric): Secondary | ICD-10-CM | POA: Diagnosis not present

## 2021-05-03 ENCOUNTER — Other Ambulatory Visit: Payer: Self-pay | Admitting: Internal Medicine

## 2021-05-04 NOTE — Telephone Encounter (Signed)
Last filled by Dr. Marius Ditch: 11/17/2020 Last OV: 09/04/2020 Next OV: not scheduled

## 2021-05-06 ENCOUNTER — Other Ambulatory Visit: Payer: Self-pay | Admitting: Internal Medicine

## 2021-05-11 DIAGNOSIS — M545 Low back pain, unspecified: Secondary | ICD-10-CM | POA: Diagnosis not present

## 2021-05-11 DIAGNOSIS — M5416 Radiculopathy, lumbar region: Secondary | ICD-10-CM | POA: Diagnosis not present

## 2021-05-11 DIAGNOSIS — M542 Cervicalgia: Secondary | ICD-10-CM | POA: Diagnosis not present

## 2021-05-31 DIAGNOSIS — G4733 Obstructive sleep apnea (adult) (pediatric): Secondary | ICD-10-CM | POA: Diagnosis not present

## 2021-06-03 LAB — HM DIABETES EYE EXAM

## 2021-06-11 ENCOUNTER — Other Ambulatory Visit: Payer: Self-pay | Admitting: Internal Medicine

## 2021-06-16 ENCOUNTER — Other Ambulatory Visit: Payer: Self-pay | Admitting: Internal Medicine

## 2021-06-19 DIAGNOSIS — M542 Cervicalgia: Secondary | ICD-10-CM | POA: Diagnosis not present

## 2021-06-19 DIAGNOSIS — M5416 Radiculopathy, lumbar region: Secondary | ICD-10-CM | POA: Diagnosis not present

## 2021-06-25 ENCOUNTER — Other Ambulatory Visit: Payer: Self-pay

## 2021-06-25 MED ORDER — METFORMIN HCL 500 MG PO TABS
500.0000 mg | ORAL_TABLET | Freq: Two times a day (BID) | ORAL | 0 refills | Status: DC
Start: 2021-06-25 — End: 2021-08-31

## 2021-06-27 ENCOUNTER — Other Ambulatory Visit: Payer: Self-pay | Admitting: Internal Medicine

## 2021-07-01 DIAGNOSIS — G4733 Obstructive sleep apnea (adult) (pediatric): Secondary | ICD-10-CM | POA: Diagnosis not present

## 2021-07-07 ENCOUNTER — Other Ambulatory Visit: Payer: Self-pay | Admitting: Internal Medicine

## 2021-07-11 DIAGNOSIS — R0602 Shortness of breath: Secondary | ICD-10-CM | POA: Diagnosis not present

## 2021-07-11 DIAGNOSIS — G4733 Obstructive sleep apnea (adult) (pediatric): Secondary | ICD-10-CM | POA: Diagnosis not present

## 2021-07-12 DIAGNOSIS — G4733 Obstructive sleep apnea (adult) (pediatric): Secondary | ICD-10-CM | POA: Diagnosis not present

## 2021-07-12 DIAGNOSIS — R0602 Shortness of breath: Secondary | ICD-10-CM | POA: Diagnosis not present

## 2021-08-02 DIAGNOSIS — Z125 Encounter for screening for malignant neoplasm of prostate: Secondary | ICD-10-CM

## 2021-08-02 DIAGNOSIS — E782 Mixed hyperlipidemia: Secondary | ICD-10-CM

## 2021-08-02 DIAGNOSIS — E119 Type 2 diabetes mellitus without complications: Secondary | ICD-10-CM

## 2021-08-02 DIAGNOSIS — I10 Essential (primary) hypertension: Secondary | ICD-10-CM

## 2021-08-03 NOTE — Telephone Encounter (Signed)
Looks like pt is due for lab work and a 6 month follow up with you. I have ordered an A1c, lipid panel, CMP and PSA. Is there anything else that needs to be added?

## 2021-08-13 DIAGNOSIS — G4733 Obstructive sleep apnea (adult) (pediatric): Secondary | ICD-10-CM | POA: Diagnosis not present

## 2021-08-21 ENCOUNTER — Other Ambulatory Visit: Payer: Self-pay | Admitting: Internal Medicine

## 2021-08-31 ENCOUNTER — Other Ambulatory Visit (INDEPENDENT_AMBULATORY_CARE_PROVIDER_SITE_OTHER): Payer: BC Managed Care – PPO

## 2021-08-31 ENCOUNTER — Other Ambulatory Visit: Payer: Self-pay

## 2021-08-31 ENCOUNTER — Other Ambulatory Visit: Payer: Self-pay | Admitting: Internal Medicine

## 2021-08-31 DIAGNOSIS — I10 Essential (primary) hypertension: Secondary | ICD-10-CM | POA: Diagnosis not present

## 2021-08-31 DIAGNOSIS — E782 Mixed hyperlipidemia: Secondary | ICD-10-CM

## 2021-08-31 DIAGNOSIS — Z125 Encounter for screening for malignant neoplasm of prostate: Secondary | ICD-10-CM

## 2021-08-31 DIAGNOSIS — E119 Type 2 diabetes mellitus without complications: Secondary | ICD-10-CM | POA: Diagnosis not present

## 2021-08-31 LAB — LDL CHOLESTEROL, DIRECT: Direct LDL: 78 mg/dL

## 2021-08-31 LAB — LIPID PANEL
Cholesterol: 120 mg/dL (ref 0–200)
HDL: 26.5 mg/dL — ABNORMAL LOW (ref >39.00)
NonHDL: 93.11
Total CHOL/HDL Ratio: 5
Triglycerides: 201 mg/dL — ABNORMAL HIGH (ref 0.0–149.0)
VLDL: 40.2 mg/dL — ABNORMAL HIGH (ref 0.0–40.0)

## 2021-08-31 LAB — COMPREHENSIVE METABOLIC PANEL
ALT: 29 U/L (ref 0–53)
AST: 23 U/L (ref 0–37)
Albumin: 4.5 g/dL (ref 3.5–5.2)
Alkaline Phosphatase: 40 U/L (ref 39–117)
BUN: 23 mg/dL (ref 6–23)
CO2: 29 mEq/L (ref 19–32)
Calcium: 9.9 mg/dL (ref 8.4–10.5)
Chloride: 103 mEq/L (ref 96–112)
Creatinine, Ser: 1.34 mg/dL (ref 0.40–1.50)
GFR: 60.08 mL/min (ref >60.00)
Glucose, Bld: 101 mg/dL — ABNORMAL HIGH (ref 70–99)
Potassium: 4.1 mEq/L (ref 3.5–5.1)
Sodium: 140 mEq/L (ref 135–145)
Total Bilirubin: 0.4 mg/dL (ref 0.2–1.2)
Total Protein: 7 g/dL (ref 6.0–8.3)

## 2021-08-31 LAB — PSA: PSA: 0.57 ng/mL (ref 0.10–4.00)

## 2021-08-31 LAB — HEMOGLOBIN A1C: Hgb A1c MFr Bld: 5.9 % (ref 4.6–6.5)

## 2021-09-07 ENCOUNTER — Encounter: Payer: Self-pay | Admitting: Internal Medicine

## 2021-09-07 ENCOUNTER — Ambulatory Visit (INDEPENDENT_AMBULATORY_CARE_PROVIDER_SITE_OTHER): Payer: BC Managed Care – PPO | Admitting: Internal Medicine

## 2021-09-07 ENCOUNTER — Other Ambulatory Visit: Payer: Self-pay

## 2021-09-07 VITALS — BP 112/69 | HR 87 | Temp 98.6°F | Ht 67.0 in | Wt 212.2 lb

## 2021-09-07 DIAGNOSIS — F5101 Primary insomnia: Secondary | ICD-10-CM

## 2021-09-07 DIAGNOSIS — K760 Fatty (change of) liver, not elsewhere classified: Secondary | ICD-10-CM | POA: Diagnosis not present

## 2021-09-07 DIAGNOSIS — I152 Hypertension secondary to endocrine disorders: Secondary | ICD-10-CM

## 2021-09-07 DIAGNOSIS — E669 Obesity, unspecified: Secondary | ICD-10-CM

## 2021-09-07 DIAGNOSIS — E6609 Other obesity due to excess calories: Secondary | ICD-10-CM

## 2021-09-07 DIAGNOSIS — Z6833 Body mass index (BMI) 33.0-33.9, adult: Secondary | ICD-10-CM | POA: Diagnosis not present

## 2021-09-07 DIAGNOSIS — Z23 Encounter for immunization: Secondary | ICD-10-CM | POA: Diagnosis not present

## 2021-09-07 DIAGNOSIS — E1169 Type 2 diabetes mellitus with other specified complication: Secondary | ICD-10-CM | POA: Diagnosis not present

## 2021-09-07 DIAGNOSIS — E1159 Type 2 diabetes mellitus with other circulatory complications: Secondary | ICD-10-CM

## 2021-09-07 DIAGNOSIS — Z0000 Encounter for general adult medical examination without abnormal findings: Secondary | ICD-10-CM

## 2021-09-07 NOTE — Progress Notes (Signed)
Patient ID: Christopher Christopher Vazquez, male    DOB: Dec 24, 1965  Age: 55 y.o. MRN: [id number]  The patient is here for annual  wellness examination and management of other chronic and acute problems.   The risk factors are reflected in the social history.  The roster of all physicians providing medical care to patient - is listed in the Snapshot section of the chart.  Activities of daily living:  The patient is 100% independent in all ADLs: dressing, toileting, feeding as well as independent mobility  Home safety : The patient has smoke detectors in the home. They wear seatbelts.  There are no firearms at home. There is no violence in the home.   There is no risks for hepatitis, STDs or HIV. There is no   history of blood transfusion. They have no travel history to infectious disease endemic areas of the world.  The patient has seen their dentist in the last six month. They have seen their eye doctor in the last year. They admit to slight hearing difficulty with regard to whispered voices and some television programs.  They have deferred audiologic testing in the last year.  They do not  have excessive sun exposure. Discussed the need for sun protection: hats, long sleeves and use of sunscreen if there is significant sun exposure.   Diet: the importance of a healthy diet is discussed. They do have a healthy diet.  The benefits of regular aerobic exercise were discussed. He  walks 4 times per week ,  20 minutes.   Depression screen: there are no signs or vegative symptoms of depression- irritability, change in appetite, anhedonia, sadness/tearfullness.  The following portions of the patient's history were reviewed and updated as appropriate: allergies, current medications, past family history, past medical history,  past surgical history, past social history  and problem list.  Visual acuity was not assessed per patient preference since she has regular follow up with her ophthalmologist. Hearing and body  mass index were assessed and reviewed.   During the course of the visit the patient was educated and counseled about appropriate screening and preventive services including : fall prevention , diabetes screening, nutrition counseling, colorectal cancer screening, and recommended immunizations.    CC: The primary encounter diagnosis was Need for immunization against influenza. Diagnoses of Encounter for preventive health examination, Class 1 obesity due to excess calories without serious comorbidity with body mass index (BMI) of 33.0 to 33.9 in adult, Obesity, diabetes, and hypertension syndrome (Christopher Vazquez), Primary insomnia, and Hepatic steatosis were also pertinent to this visit.  1) bilateral onychomycosis vs dystrophic nail  of first and 5th on left foot 5th on right . ,  saw Christopher Christopher Vazquez dermatology 2 years ago , some kind of culture done.  Was given a powder that was mixed and brushed onto toenail to soften and thin it .  1st toe dystrophy started after trauma  Jan 2020 or earlier  2) not sleeping through the night   spends 30 minutes awake  at 2:30 .  Not in pain .  Taking 100 mg trazodone at bedtime.  Gets up at  5:15  ' 3) DM:  sugars drops into the 60's in late afternoon   several days per week. only taking metformin,  fastings are 118.  Weight has plateaued at 212.  Very active at work.    History Christopher Christopher Vazquez has a past medical history of Cancer (Christopher Christopher Vazquez), Complication of anesthesia, Diabetes mellitus without complication (Christopher Christopher Vazquez), GERD (gastroesophageal reflux disease), Hyperlipidemia, and  Hypertension.   He has a past surgical history that includes Tonsillectomy and adenoidectomy; Hernia repair; Cholesteatoma excision (Left, 2000); Knee arthroscopy with medial menisectomy (Left, 07/01/2016); Colonoscopy with propofol (N/A, 11/17/2017); Umbilical hernia repair (N/A, 08/20/2019); Esophagogastroduodenoscopy (egd) with propofol (N/A, 09/16/2020); and Biopsy of skin subcutaneous tissue and/or mucous membrane.   His  family history includes AAA (abdominal aortic aneurysm) in his father; Aortic aneurysm (age of onset: 71) in his father; Diabetes in his mother; Heart disease (age of onset: 39) in his father; Hypertension in his mother; Uterine cancer in his mother.He reports that he has never smoked. He has never used smokeless tobacco. He reports that he does not currently use alcohol. He reports that he does not use drugs.  Outpatient Medications Prior to Visit  Medication Sig Dispense Refill   aspirin 81 MG tablet Take 81 mg by mouth at bedtime.     fenofibrate 160 MG tablet TAKE 1 TABLET DAILY 90 tablet 3   metFORMIN (GLUCOPHAGE) 500 MG tablet TAKE 1 TABLET TWICE A DAY WITH MEALS 180 tablet 3   montelukast (SINGULAIR) 10 MG tablet TAKE 1 TABLET AT BEDTIME (NEED FOLLOW UP APPOINTMENT) 90 tablet 3   pantoprazole (PROTONIX) 40 MG tablet Take 1 tablet by mouth once daily 90 tablet 0   pantoprazole (PROTONIX) 40 MG tablet Take 1 tablet by mouth once daily 90 tablet 0   sildenafil (REVATIO) 20 MG tablet TAKE 1 TABLET BY MOUTH THREE TIMES DAILY 90 tablet 0   simvastatin (ZOCOR) 20 MG tablet TAKE 1 TABLET DAILY AT BEDTIME 90 tablet 3   telmisartan-hydrochlorothiazide (MICARDIS HCT) 80-25 MG tablet TAKE 1 TABLET DAILY 90 tablet 3   traZODone (DESYREL) 100 MG tablet TAKE 1 TABLET AT BEDTIME AS NEEDED FOR SLEEP 90 tablet 3   blood glucose meter kit and supplies Use to check blood sugar once daily. 1 each 0   No facility-administered medications prior to visit.    Review of Systems  Objective:  BP 112/69   Pulse 87   Temp 98.6 F (37 C) (Oral)   Ht 5\' 7"  (1.702 m)   Wt 212 lb 3.2 oz (96.3 kg)   SpO2 96%   BMI 33.24 kg/m   Physical Exam  Physical Exam   Assessment & Plan:   Problem List Items Addressed This Visit     Encounter for preventive health examination    age appropriate education and counseling updated, referrals for preventative services and immunizations addressed, dietary and smoking  counseling addressed, most recent labs reviewed.  I have personally reviewed and have noted:   1) the patient's medical and social history 2) The pt's use of alcohol, tobacco, and illicit drugs 3) The patient's current medications and supplements 4) Functional ability including ADL's, fall risk, home safety risk, hearing and visual impairment 5) Diet and physical activities 6) Evidence for depression or mood disorder 7) The patient's height, weight, and BMI have been recorded in the chart   I have made referrals, and provided counseling and education based on review of the above      Hepatic steatosis    Hepatic enzymes are  normalized and he has no signs of cirrhosis or synthetic dysfunction .  continue low glycemic index diet, statin, renew efforts to get  BMI < 30 ,  and continuing  Metformin.  Checking hepatitis A/B titres   Lab Results  Component Value Date   ALT 29 08/31/2021   AST 23 08/31/2021   ALKPHOS 40 08/31/2021  BILITOT 0.4 08/31/2021           Insomnia    Waking up early despite 100 mg trazodone.  Advised to add melatonin       RESOLVED: Obesity    I have congratulated hi in reduction of   BMI and encouraged  Continued weight loss with goal of 10% of body weigh over the next 6 months using a low glycemic index diet and regular exercise a minimum of 5 days per week.        Obesity, diabetes, and hypertension syndrome ([place])    Improved control since  Starting metformin 500 mg bid .but now having afternoon hypoglycemia.  Advised to alter his dietary habits and if epsidoes contineu,  To stop the metformin. He has no proteinuria.  Taking statin and ARB and bp is at goal   Lab Results  Component Value Date   HGBA1C 5.9 08/31/2021   Lab Results  Component Value Date   MICROALBUR 1.6 03/06/2020   MICROALBUR 0.4 12/08/2018           Other Visit Diagnoses     Need for immunization against influenza    -  Primary   Relevant Orders   Flu Vaccine QUAD  74mo+IM (Fluarix, Fluzone & Alfiuria Quad PF) (Completed)       I am having Christopher Christopher Vazquez maintain his aspirin, traZODone, simvastatin, fenofibrate, blood glucose meter kit and supplies, telmisartan-hydrochlorothiazide, pantoprazole, pantoprazole, montelukast, sildenafil, and metFORMIN.  No orders of the defined types were placed in this encounter.   There are no discontinued medications.  Follow-up: No follow-ups on file.   Crecencio Mc, MD

## 2021-09-07 NOTE — Assessment & Plan Note (Signed)

## 2021-09-07 NOTE — Patient Instructions (Addendum)
I recommend trying melatonin for your insomnia.  It is not a sedative,  But must be taken on  a regular basis to help your internal clock.  Take every evening after dinner start with 3 mg dose   Max effective dose is 6 mg

## 2021-09-08 DIAGNOSIS — G4700 Insomnia, unspecified: Secondary | ICD-10-CM | POA: Insufficient documentation

## 2021-09-08 NOTE — Assessment & Plan Note (Signed)
I have congratulated hi in reduction of   BMI and encouraged  Continued weight loss with goal of 10% of body weigh over the next 6 months using a low glycemic index diet and regular exercise a minimum of 5 days per week.

## 2021-09-08 NOTE — Assessment & Plan Note (Addendum)
Improved control since  Starting metformin 500 mg bid .but now having afternoon hypoglycemia.  Advised to alter his dietary habits and if epsidoes contineu,  To stop the metformin. He has no proteinuria.  Taking statin and ARB and bp is at goal   Lab Results  Component Value Date   HGBA1C 5.9 08/31/2021   Lab Results  Component Value Date   MICROALBUR 1.6 03/06/2020   MICROALBUR 0.4 12/08/2018

## 2021-09-08 NOTE — Assessment & Plan Note (Signed)
Hepatic enzymes are  normalized and he has no signs of cirrhosis or synthetic dysfunction .  continue low glycemic index diet, statin, renew efforts to get  BMI < 30 ,  and continuing  Metformin.  Checking hepatitis A/B titres   Lab Results  Component Value Date   ALT 29 08/31/2021   AST 23 08/31/2021   ALKPHOS 40 08/31/2021   BILITOT 0.4 08/31/2021

## 2021-09-08 NOTE — Assessment & Plan Note (Signed)
Waking up early despite 100 mg trazodone.  Advised to add melatonin

## 2021-09-13 ENCOUNTER — Ambulatory Visit
Admission: RE | Admit: 2021-09-13 | Discharge: 2021-09-13 | Disposition: A | Discharge status: Home / Self Care | Payer: BC Managed Care – PPO | Source: Ambulatory Visit | Attending: Emergency Medicine | Admitting: Emergency Medicine

## 2021-09-13 ENCOUNTER — Other Ambulatory Visit: Payer: Self-pay

## 2021-09-13 VITALS — BP 144/80 | HR 87 | Temp 99.3°F | Resp 18

## 2021-09-13 DIAGNOSIS — J0100 Acute maxillary sinusitis, unspecified: Secondary | ICD-10-CM

## 2021-09-13 MED ORDER — AMOXICILLIN 875 MG PO TABS: 875.0000 mg | ORAL_TABLET | Freq: Two times a day (BID) | ORAL | 0 refills | Status: AC

## 2021-09-13 NOTE — ED Provider Notes (Signed)
[facility]    CSN: [id number] Arrival date & time: 09/13/21  1400      History   Chief Complaint Chief Complaint  Patient presents with   Cough   Fever   Nasal Congestion    HPI Christopher Vazquez is a 55 y.o. male.  Patient presents with 1 week history of sinus congestion, sinus pressure, cough.  He reports low-grade fever of 99.  He denies shortness of breath, vomiting, diarrhea, or other symptoms.  Treatment attempted at home with OTC sinus and cough medications.  His medical history includes hypertension and diabetes.  The history is provided by the patient and medical records.   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   Diabetes mellitus without complication ([place])    GERD (gastroesophageal reflux disease)    Hyperlipidemia    Hypertension     Patient Active Problem List   Diagnosis Date Noted   Insomnia 09/08/2021   Exertional dyspnea 11/02/2020   Esophageal dysphagia    Disorder of bursae of shoulder region 09/08/2020   Ectatic abdominal aorta ([place]) 09/04/2020   OSA (obstructive sleep apnea) 08/07/2020   Witnessed apneic spells 07/12/2020   Pain in joint of left shoulder 11/16/2019   Encounter for preventive health examination 10/23/2017   Obesity, diabetes, and hypertension syndrome ([place]) 08/18/2015   Vitamin D deficiency 02/19/2014   Seasonal allergic rhinitis 02/02/2014   Dysphagia, unspecified(787.20) 03/07/2013   Hepatic steatosis 03/07/2013   Hyperlipidemia    Essential hypertension     Past Surgical History:  Procedure Laterality Date   BIOPSY OF SKIN SUBCUTANEOUS TISSUE AND/OR MUCOUS MEMBRANE     nose   CHOLESTEATOMA EXCISION Left 2000   left ear   COLONOSCOPY WITH PROPOFOL N/A 11/17/2017   Procedure: COLONOSCOPY WITH PROPOFOL;  Surgeon: Lin Landsman, MD;  Location: [facility];  Service:  Gastroenterology;  Laterality: N/A;   ESOPHAGOGASTRODUODENOSCOPY (EGD) WITH PROPOFOL N/A 09/16/2020   Procedure: ESOPHAGOGASTRODUODENOSCOPY (EGD) WITH PROPOFOL;  Surgeon: Lin Landsman, MD;  Location: [facility];  Service: Gastroenterology;  Laterality: N/A;   HERNIA REPAIR     KNEE ARTHROSCOPY WITH MEDIAL MENISECTOMY Left 07/01/2016   Procedure: left knee arthroscopy with partial medial menisectomy;  Surgeon: Hessie Knows, MD;  Location: [facility];  Service: Orthopedics;  Laterality: Left;   TONSILLECTOMY AND ADENOIDECTOMY     UMBILICAL HERNIA REPAIR N/A 08/20/2019   Procedure: LAPAROSCOPIC ASSISTED UMBILICAL HERNIA REPAIR WITH MESH;  Surgeon: Johnathan Hausen, MD;  Location: [facility];  Service: General;  Laterality: N/A;       Home Medications    Prior to Admission medications   Medication Sig Start Date End Date Taking? Authorizing Provider  amoxicillin (AMOXIL) 875 MG tablet Take 1 tablet (875 mg total) by mouth 2 (two) times daily for 10 days. 09/13/21 09/23/21 Yes Sharion Balloon, NP  aspirin 81 MG tablet Take 81 mg by mouth at bedtime.    [provider]  blood glucose meter kit and supplies Use to check blood sugar once daily. 04/15/21   Crecencio Mc, MD  fenofibrate 160 MG tablet TAKE 1 TABLET DAILY 11/25/20   Crecencio Mc, MD  metFORMIN (GLUCOPHAGE) 500 MG tablet TAKE 1 TABLET TWICE A DAY WITH MEALS 08/31/21   Crecencio Mc, MD  montelukast (SINGULAIR) 10 MG tablet TAKE 1 TABLET AT BEDTIME (NEED FOLLOW  UP APPOINTMENT) 07/08/21   Crecencio Mc, MD  pantoprazole (PROTONIX) 40 MG tablet Take 1 tablet by mouth once daily 06/29/21   Crecencio Mc, MD  pantoprazole (PROTONIX) 40 MG tablet Take 1 tablet by mouth once daily 06/29/21   Crecencio Mc, MD  sildenafil (REVATIO) 20 MG tablet TAKE 1 TABLET BY MOUTH THREE TIMES DAILY 08/21/21   Crecencio Mc, MD  simvastatin (ZOCOR) 20 MG tablet TAKE 1 TABLET DAILY AT BEDTIME 11/25/20   Crecencio Mc, MD   telmisartan-hydrochlorothiazide (MICARDIS HCT) 80-25 MG tablet TAKE 1 TABLET DAILY 06/16/21   Crecencio Mc, MD  traZODone (DESYREL) 100 MG tablet TAKE 1 TABLET AT BEDTIME AS NEEDED FOR SLEEP 11/17/20   Crecencio Mc, MD    Family History Family History  Problem Relation Age of Onset   Diabetes Mother    Hypertension Mother    Uterine cancer Mother    Aortic aneurysm Father 45   Heart disease Father 30   AAA (abdominal aortic aneurysm) Father     Social History Social History   Tobacco Use   Smoking status: Never   Smokeless tobacco: Never  Vaping Use   Vaping Use: Never used  Substance Use Topics   Alcohol use: Not Currently   Drug use: No     Allergies   Niacin, Shellfish allergy, Scopolamine, and Niacin and related   Review of Systems Review of Systems  Constitutional:  Positive for fever. Negative for chills.  HENT:  Positive for congestion, postnasal drip and sinus pressure. Negative for ear pain and sore throat.   Respiratory:  Positive for cough. Negative for shortness of breath.   Cardiovascular:  Negative for chest pain and palpitations.  Gastrointestinal:  Negative for diarrhea and vomiting.  Skin:  Negative for color change and rash.  All other systems reviewed and are negative.   Physical Exam Triage Vital Signs ED Triage Vitals  Enc Vitals Group     BP      Pulse      Resp      Temp      Temp src      SpO2      Weight      Height      Head Circumference      Peak Flow      Pain Score      Pain Loc      Pain Edu?      Excl. in [place]?    No data found.  Updated Vital Signs BP (!) 144/80   Pulse 87   Temp 99.3 F (37.4 C)   Resp 18   SpO2 100%   Visual Acuity Right Eye Distance:   Left Eye Distance:   Bilateral Distance:    Right Eye Near:   Left Eye Near:    Bilateral Near:     Physical Exam Vitals and nursing note reviewed.  Constitutional:      General: He is not in acute distress.    Appearance: He is well-developed.   HENT:     Head: Normocephalic and atraumatic.     Right Ear: Tympanic membrane normal.     Left Ear: Tympanic membrane normal.     Nose: Congestion present.     Mouth/Throat:     Mouth: Mucous membranes are moist.     Pharynx: Oropharynx is clear.  Eyes:     Conjunctiva/sclera: Conjunctivae normal.  Cardiovascular:     Rate and Rhythm: Normal rate  and regular rhythm.     Heart sounds: Normal heart sounds.  Pulmonary:     Effort: Pulmonary effort is normal. No respiratory distress.     Breath sounds: Normal breath sounds.  Abdominal:     Palpations: Abdomen is soft.     Tenderness: There is no abdominal tenderness.  Musculoskeletal:     Cervical back: Neck supple.  Skin:    General: Skin is warm and dry.  Neurological:     Mental Status: He is alert.  Psychiatric:        Mood and Affect: Mood normal.        Behavior: Behavior normal.     UC Treatments / Results  Labs (all labs ordered are listed, but only abnormal results are displayed) Labs Reviewed  COVID-19, FLU A+B NAA    EKG   Radiology No results found.  Procedures Procedures (including critical care time)  Medications Ordered in UC Medications - No data to display  Initial Impression / Assessment and Plan / UC Course  I have reviewed the triage vital signs and the nursing notes.  Pertinent labs & imaging results that were available during my care of the patient were reviewed by me and considered in my medical decision making (see chart for details).    Acute sinusitis.  Treating with amoxicillin.  Instructed patient to take Tylenol or ibuprofen as needed for fever or discomfort.  COVID and flu pending.  Instructed patient to follow-up with his PCP if his symptoms are not improving.  He agrees to plan of care.     Final Clinical Impressions(s) / UC Diagnoses   Final diagnoses:  Acute non-recurrent maxillary sinusitis     Discharge Instructions      Take the amoxicillin as directed.    Your  COVID and Flu tests are pending.    Take Tylenol or ibuprofen as needed for fever or discomfort.  Rest and keep yourself hydrated.    Follow-up with your primary care provider if your symptoms are not improving.         ED Prescriptions     Medication Sig Dispense Auth. Provider   amoxicillin (AMOXIL) 875 MG tablet Take 1 tablet (875 mg total) by mouth 2 (two) times daily for 10 days. 20 tablet Sharion Balloon, NP      PDMP not reviewed this encounter.   Sharion Balloon, NP 09/13/21 1436

## 2021-09-13 NOTE — Discharge Instructions (Addendum)
Take the amoxicillin as directed.    Your COVID and Flu tests are pending.    Take Tylenol or ibuprofen as needed for fever or discomfort.  Rest and keep yourself hydrated.    Follow-up with your primary care provider if your symptoms are not improving.

## 2021-09-13 NOTE — ED Triage Notes (Signed)
Pt here with cough, congestion and fever x 3 days. Flu vaccine on Monday

## 2021-09-14 LAB — COVID-19, FLU A+B NAA
Influenza A, NAA: NOT DETECTED
Influenza B, NAA: NOT DETECTED
SARS-CoV-2, NAA: NOT DETECTED

## 2021-10-19 ENCOUNTER — Other Ambulatory Visit: Payer: Self-pay | Admitting: Internal Medicine

## 2021-11-03 ENCOUNTER — Other Ambulatory Visit: Payer: Self-pay | Admitting: Internal Medicine

## 2021-11-20 DIAGNOSIS — M5416 Radiculopathy, lumbar region: Secondary | ICD-10-CM | POA: Diagnosis not present

## 2021-11-20 DIAGNOSIS — M545 Low back pain, unspecified: Secondary | ICD-10-CM | POA: Diagnosis not present

## 2021-11-23 DIAGNOSIS — M545 Low back pain, unspecified: Secondary | ICD-10-CM | POA: Diagnosis not present

## 2021-11-23 DIAGNOSIS — M62830 Muscle spasm of back: Secondary | ICD-10-CM | POA: Diagnosis not present

## 2021-11-23 DIAGNOSIS — M5136 Other intervertebral disc degeneration, lumbar region: Secondary | ICD-10-CM | POA: Diagnosis not present

## 2021-11-23 DIAGNOSIS — M47816 Spondylosis without myelopathy or radiculopathy, lumbar region: Secondary | ICD-10-CM | POA: Diagnosis not present

## 2021-11-25 ENCOUNTER — Other Ambulatory Visit: Payer: Self-pay

## 2021-11-25 MED ORDER — SIMVASTATIN 20 MG PO TABS: 20.0000 mg | ORAL_TABLET | Freq: Every day | ORAL | 3 refills | Status: DC

## 2021-11-25 MED ORDER — FENOFIBRATE 160 MG PO TABS: 160.0000 mg | ORAL_TABLET | Freq: Every day | ORAL | 3 refills | Status: DC

## 2021-12-12 ENCOUNTER — Other Ambulatory Visit: Payer: Self-pay | Admitting: Internal Medicine

## 2021-12-14 ENCOUNTER — Other Ambulatory Visit: Payer: Self-pay | Admitting: Internal Medicine

## 2021-12-18 ENCOUNTER — Other Ambulatory Visit: Payer: Self-pay | Admitting: Internal Medicine

## 2022-01-13 ENCOUNTER — Encounter: Payer: Self-pay | Admitting: Internal Medicine

## 2022-01-14 ENCOUNTER — Other Ambulatory Visit: Payer: Self-pay

## 2022-01-14 MED ORDER — TELMISARTAN-HCTZ 80-25 MG PO TABS: 1.0000 | ORAL_TABLET | Freq: Every day | ORAL | 3 refills | Status: DC

## 2022-02-13 ENCOUNTER — Encounter: Payer: Self-pay | Admitting: Internal Medicine

## 2022-02-13 DIAGNOSIS — K760 Fatty (change of) liver, not elsewhere classified: Secondary | ICD-10-CM

## 2022-02-13 DIAGNOSIS — I152 Hypertension secondary to endocrine disorders: Secondary | ICD-10-CM

## 2022-02-13 DIAGNOSIS — I10 Essential (primary) hypertension: Secondary | ICD-10-CM

## 2022-02-15 NOTE — Telephone Encounter (Signed)
I have pended A1c, CMP, and Lipid panel. Is there anything else that the pt needs to have drawn?  ?

## 2022-02-16 ENCOUNTER — Other Ambulatory Visit: Payer: Self-pay | Admitting: Internal Medicine

## 2022-03-16 ENCOUNTER — Encounter: Payer: Self-pay | Admitting: Internal Medicine

## 2022-03-16 ENCOUNTER — Ambulatory Visit (INDEPENDENT_AMBULATORY_CARE_PROVIDER_SITE_OTHER): Payer: BC Managed Care – PPO | Admitting: Internal Medicine

## 2022-03-16 VITALS — BP 110/78 | HR 74 | Temp 98.0°F | Ht 67.0 in | Wt 220.4 lb

## 2022-03-16 DIAGNOSIS — I77811 Abdominal aortic ectasia: Secondary | ICD-10-CM

## 2022-03-16 DIAGNOSIS — E1169 Type 2 diabetes mellitus with other specified complication: Secondary | ICD-10-CM | POA: Diagnosis not present

## 2022-03-16 DIAGNOSIS — K760 Fatty (change of) liver, not elsewhere classified: Secondary | ICD-10-CM

## 2022-03-16 DIAGNOSIS — F4321 Adjustment disorder with depressed mood: Secondary | ICD-10-CM

## 2022-03-16 DIAGNOSIS — I10 Essential (primary) hypertension: Secondary | ICD-10-CM

## 2022-03-16 DIAGNOSIS — E669 Obesity, unspecified: Secondary | ICD-10-CM

## 2022-03-16 DIAGNOSIS — I152 Hypertension secondary to endocrine disorders: Secondary | ICD-10-CM

## 2022-03-16 DIAGNOSIS — E1159 Type 2 diabetes mellitus with other circulatory complications: Secondary | ICD-10-CM | POA: Diagnosis not present

## 2022-03-16 DIAGNOSIS — F5101 Primary insomnia: Secondary | ICD-10-CM

## 2022-03-16 LAB — COMPREHENSIVE METABOLIC PANEL
ALT: 37 U/L (ref 0–53)
AST: 28 U/L (ref 0–37)
Albumin: 4.4 g/dL (ref 3.5–5.2)
Alkaline Phosphatase: 43 U/L (ref 39–117)
BUN: 21 mg/dL (ref 6–23)
CO2: 28 mEq/L (ref 19–32)
Calcium: 9.4 mg/dL (ref 8.4–10.5)
Chloride: 103 mEq/L (ref 96–112)
Creatinine, Ser: 1.35 mg/dL (ref 0.40–1.50)
GFR: 59.32 mL/min — ABNORMAL LOW (ref >60.00)
Glucose, Bld: 91 mg/dL (ref 70–99)
Potassium: 4 mEq/L (ref 3.5–5.1)
Sodium: 139 mEq/L (ref 135–145)
Total Bilirubin: 0.3 mg/dL (ref 0.2–1.2)
Total Protein: 7.2 g/dL (ref 6.0–8.3)

## 2022-03-16 LAB — URINALYSIS, ROUTINE W REFLEX MICROSCOPIC
Bilirubin Urine: NEGATIVE
Hgb urine dipstick: NEGATIVE
Ketones, ur: NEGATIVE
Leukocytes,Ua: NEGATIVE
Nitrite: NEGATIVE
RBC / HPF: NONE SEEN (ref 0–?)
Specific Gravity, Urine: 1.01 (ref 1.000–1.030)
Total Protein, Urine: NEGATIVE
Urine Glucose: NEGATIVE
Urobilinogen, UA: 0.2 (ref 0.0–1.0)
WBC, UA: NONE SEEN (ref 0–?)
pH: 6.5 (ref 5.0–8.0)

## 2022-03-16 LAB — LIPID PANEL
Cholesterol: 120 mg/dL (ref 0–200)
HDL: 27.7 mg/dL — ABNORMAL LOW (ref >39.00)
NonHDL: 92.01
Total CHOL/HDL Ratio: 4
Triglycerides: 238 mg/dL — ABNORMAL HIGH (ref 0.0–149.0)
VLDL: 47.6 mg/dL — ABNORMAL HIGH (ref 0.0–40.0)

## 2022-03-16 LAB — MICROALBUMIN / CREATININE URINE RATIO
Creatinine,U: 105 mg/dL
Microalb Creat Ratio: 0.7 mg/g (ref 0.0–30.0)
Microalb, Ur: <0.7 mg/dL (ref 0.0–1.9)

## 2022-03-16 LAB — HEMOGLOBIN A1C: Hgb A1c MFr Bld: 6.1 % (ref 4.6–6.5)

## 2022-03-16 LAB — LDL CHOLESTEROL, DIRECT: Direct LDL: 77 mg/dL

## 2022-03-16 MED ORDER — ALPRAZOLAM 0.25 MG PO TABS: 0.2500 mg | ORAL_TABLET | Freq: Every evening | ORAL | 0 refills | Status: DC | PRN

## 2022-03-16 NOTE — Assessment & Plan Note (Signed)
Improved control since  Starting metformin 500 mg bid .but now having late morning .  Advised to alter his dietary habits and isuspend the mornign dose of metformin . He has no proteinuria.  Taking statin and ARB and bp is at [place] ? ?Lab Results  ?Component Value Date  ? HGBA1C 6.1 03/16/2022  ? ?Lab Results  ?Component Value Date  ? MICROALBUR <0.7 03/16/2022  ? MICROALBUR 1.6 03/06/2020  ? ? ? ? ?

## 2022-03-16 NOTE — Progress Notes (Signed)
? ?Subjective:  ?Patient ID: Christopher Vazquez, male    DOB: [birth year]  Age: 56 y.o. MRN: [id number] ? ?CC: The primary encounter diagnosis was Essential hypertension. Diagnoses of Obesity, diabetes, and hypertension syndrome ([place]), Ectatic abdominal aorta ([place]), Hepatic steatosis, Grief, and Primary insomnia were also pertinent to this visit. ? ? ?This visit occurred during the SARS-CoV-2 public health emergency.  Safety protocols were in place, including screening questions prior to the visit, additional usage of staff PPE, and extensive cleaning of exam room while observing appropriate contact time as indicated for disinfecting solutions.   ? ?HPI ?Lawernce Keas presents follow up on multiple isisues   ?Chief Complaint  ?Patient presents with  ? Follow-up  ?  Pt would like to discuss not sleeping well  ? ?1)  T2DM: FASTINGS < 120 but having recurrent post prandial lunch drops to 70's . Lunch menu reviewed no   red flags.  Taking metformin bid.    ? ?2) Grief : los of father 2 weeks ago after a difficult 2 years of declining health due to battling cancer and ESKD.  He is having difficulty filling the void since he used to talk t his father every evening fore 1-2 hours . Waking up at 2am most nights/, alarm  goes off at 5.  Taking  trazodone  ? ?3) HTN:  with ectatic aorta by 2019 ultrasound :  Patient is taking his medications as prescribed and notes no adverse effects.  Home BP readings have been done about once per week and are  generally < 130/80 .  is avoiding added salt in her diet and walking regularly about 3 times per week for exercise  . ? ? ? ?Outpatient Medications Prior to Visit  ?Medication Sig Dispense Refill  ? aspirin 81 MG tablet Take 81 mg by mouth at bedtime.    ? fenofibrate 160 MG tablet TAKE 1 TABLET DAILY 90 tablet 3  ? metFORMIN (GLUCOPHAGE) 500 MG tablet TAKE 1 TABLET TWICE A DAY WITH MEALS 180 tablet 3  ? montelukast (SINGULAIR) 10 MG tablet TAKE 1 TABLET AT BEDTIME (NEED FOLLOW UP  APPOINTMENT) 90 tablet 3  ? pantoprazole (PROTONIX) 40 MG tablet Take 1 tablet by mouth once daily 90 tablet 0  ? sildenafil (REVATIO) 20 MG tablet TAKE 1 TABLET BY MOUTH THREE TIMES DAILY 90 tablet 0  ? simvastatin (ZOCOR) 20 MG tablet TAKE 1 TABLET DAILY AT BEDTIME 90 tablet 3  ? telmisartan-hydrochlorothiazide (MICARDIS HCT) 80-25 MG tablet Take 1 tablet by mouth daily. 90 tablet 3  ? traZODone (DESYREL) 100 MG tablet TAKE 1 TABLET AT BEDTIME AS NEEDED FOR SLEEP 90 tablet 3  ? blood glucose meter kit and supplies Use to check blood sugar once daily. 1 each 0  ? pantoprazole (PROTONIX) 40 MG tablet Take 1 tablet by mouth once daily 90 tablet 0  ? ?No facility-administered medications prior to visit.  ? ? ?Review of Systems; ? ?Patient denies headache, fevers, malaise, unintentional weight loss, skin rash, eye pain, sinus congestion and sinus pain, sore throat, dysphagia,  hemoptysis , cough, dyspnea, wheezing, chest pain, palpitations, orthopnea, edema, abdominal pain, nausea, melena, diarrhea, constipation, flank pain, dysuria, hematuria, urinary  Frequency, nocturia, numbness, tingling, seizures,  Focal weakness, Loss of consciousness,  Tremor, depression, anxiety, and suicidal ideation.   ? ? ? ?Objective:  ?BP 110/78 (BP Location: Left Arm, Patient Position: Sitting, Cuff Size: Large)   Pulse 74   Temp 98 ?F (36.7 ?C) (Oral)  Ht _0  (1.702 m)   Wt 220 lb 6.4 oz (100 kg)   SpO2 97%   BMI 34.52 kg/m?  ? ?BP Readings from Last 3 Encounters:  ?03/16/22 110/78  ?09/13/21 (!) 144/80  ?09/07/21 112/69  ? ? ?Wt Readings from Last 3 Encounters:  ?03/16/22 220 lb 6.4 oz (100 kg)  ?09/07/21 212 lb 3.2 oz (96.3 kg)  ?04/30/21 220 lb 6 oz (100 kg)  ? ? ?General appearance: alert, cooperative and appears stated age ?Ears: normal TM's and external ear canals both ears ?Throat: lips, mucosa, and tongue normal; teeth and gums normal ?Neck: no adenopathy, no carotid bruit, supple, symmetrical, trachea midline and  thyroid not enlarged, symmetric, no tenderness/mass/nodules ?Back: symmetric, no curvature. ROM normal. No CVA tenderness. ?Lungs: clear to auscultation bilaterally ?Heart: regular rate and rhythm, S1, S2 normal, no murmur, click, rub or gallop ?Abdomen: soft, non-tender; bowel sounds normal; no masses,  no organomegaly ?Pulses: 2+ and symmetric ?Skin: Skin color, texture, turgor normal. No rashes or lesions ?Lymph nodes: Cervical, supraclavicular, and axillary nodes normal. ? ?Lab Results  ?Component Value Date  ? HGBA1C 6.1 03/16/2022  ? HGBA1C 5.9 08/31/2021  ? HGBA1C 6.2 01/16/2021  ? ? ?Lab Results  ?Component Value Date  ? CREATININE 1.35 03/16/2022  ? CREATININE 1.34 08/31/2021  ? CREATININE 1.35 01/16/2021  ? ? ?Lab Results  ?Component Value Date  ? WBC 5.8 05/26/2020  ? HGB 15.0 05/26/2020  ? HCT 42 05/26/2020  ? PLT 246 05/26/2020  ? GLUCOSE 91 03/16/2022  ? CHOL 120 03/16/2022  ? TRIG 238.0 (H) 03/16/2022  ? HDL 27.70 (L) 03/16/2022  ? LDLDIRECT 77.0 03/16/2022  ? LDLCALC 49 01/16/2021  ? ALT 37 03/16/2022  ? AST 28 03/16/2022  ? NA 139 03/16/2022  ? K 4.0 03/16/2022  ? CL 103 03/16/2022  ? CREATININE 1.35 03/16/2022  ? BUN 21 03/16/2022  ? CO2 28 03/16/2022  ? TSH 3.30 05/26/2020  ? PSA 0.57 08/31/2021  ? HGBA1C 6.1 03/16/2022  ? MICROALBUR <0.7 03/16/2022  ? ? ?No results found. ? ?Assessment & Plan:  ? ?Problem List Items Addressed This Visit   ? ? Ectatic abdominal aorta (HCC)  ?  Bu January 2019 ultrasound of retroperitoneum:  Slightly ectatic abdominal aorta at risk for aneurysm development.He is maintaining a BP of 130/80 or less.  ?Recommend followup by ultrasound in 5 years. This recommendationfollows ACR consensus guidelines:  ? ?  ?  ? Essential hypertension - Primary  ?  Well controlled on current regimen of telmisartan/cht 80/25 mg once daily . Renal function stable, no changes today. ? ?  ?  ? Grief  ?  Secondary to loss of father 2 weeks ago.  Addressing early wakening with low dose  alprazolam ? ?  ?  ? Hepatic steatosis  ?  Presumed by ultrasound changes and serologies negative for autoimmune causes of hepatitis.  Current liver enzymes are normal and all modifiable risk factors including obesity, diabetes and hyperlipidemia have been addressed  ? ?  ?  ? Insomnia  ?  Using melatonin to fall asleep and waking up at 2 am nearly every night.   rx . The risks and benefits of benzodiazepine use were discussed with patient today including excessive sedation leading to respiratory depression,  impaired thinking/driving, and addiction.  Patient was advised to avoid concurrent use with alcohol, to use medication only as needed and not to share with others  .  ? ?  ?  ?  Obesity, diabetes, and hypertension syndrome ([place])  ?  Improved control since  Starting metformin 500 mg bid .but now having late morning .  Advised to alter his dietary habits and isuspend the mornign dose of metformin . He has no proteinuria.  Taking statin and ARB and bp is at [place] ? ?Lab Results  ?Component Value Date  ? HGBA1C 6.1 03/16/2022  ? ?Lab Results  ?Component Value Date  ? MICROALBUR <0.7 03/16/2022  ? MICROALBUR 1.6 03/06/2020  ? ? ? ? ?  ?  ? Relevant Orders  ? Hemoglobin A1c (Completed)  ? Comprehensive metabolic panel (Completed)  ? Lipid panel (Completed)  ? Microalbumin / creatinine urine ratio (Completed)  ? Urinalysis, Routine w reflex microscopic (Completed)  ? ? ?I spent a total of  30  minutes with this patient in a face to face visit on the date of this encounter reviewing  his relationship with his father,  his current grief state,  previous a abdominal ultrasound  his weight gain, diet and eating habits   his blood pressure readings , nd post visit ordering of testing and therapeutics.   ? ?Follow-up: Return in about 6 months (around 09/16/2022) for follow up diabetes. ? ? ?Crecencio Mc, MD ?

## 2022-03-16 NOTE — Assessment & Plan Note (Signed)
Bu January 2019 ultrasound of retroperitoneum:  Slightly ectatic abdominal aorta at risk for aneurysm development.He is maintaining a BP of 130/80 or less.  ?Recommend followup by ultrasound in 5 years. This recommendationfollows ACR consensus guidelines:  ?

## 2022-03-16 NOTE — Assessment & Plan Note (Signed)
Presumed by ultrasound changes and serologies negative for autoimmune causes of hepatitis.  Current liver enzymes are normal and all modifiable risk factors including obesity, diabetes and hyperlipidemia have been addressed  

## 2022-03-16 NOTE — Assessment & Plan Note (Signed)
Secondary to loss of father 2 weeks ago.  Addressing early wakening with low dose alprazolam ?

## 2022-03-16 NOTE — Assessment & Plan Note (Signed)
Using melatonin to fall asleep and waking up at 2 am nearly every night.   rx . The risks and benefits of benzodiazepine use were discussed with patient today including excessive sedation leading to respiratory depression,  impaired thinking/driving, and addiction.  Patient was advised to avoid concurrent use with alcohol, to use medication only as needed and not to share with others  .  ?

## 2022-03-16 NOTE — Assessment & Plan Note (Signed)
Well controlled on current regimen of telmisartan/cht 80/25 mg once daily . Renal function stable, no changes today. ?

## 2022-03-16 NOTE — Patient Instructions (Signed)
You were blessed with a wonderful father and a wonderful relationship that not many men have been able to have .  Try to remember that .   ? ?Use the alprazolam for the 2 am wakeups.  Start with 1/2 tablet  ? ? ?Suspend the morning dose of metformin ? ?Go for a walk with Angie every night  ?

## 2022-03-19 ENCOUNTER — Ambulatory Visit: Payer: BC Managed Care – PPO | Admitting: Internal Medicine

## 2022-03-30 ENCOUNTER — Telehealth: Payer: BC Managed Care – PPO | Admitting: Internal Medicine

## 2022-04-14 ENCOUNTER — Encounter: Payer: Self-pay | Admitting: Internal Medicine

## 2022-04-14 ENCOUNTER — Other Ambulatory Visit: Payer: Self-pay

## 2022-04-14 MED ORDER — MONTELUKAST SODIUM 10 MG PO TABS: ORAL_TABLET | ORAL | 0 refills | Status: DC

## 2022-04-14 MED ORDER — TRAZODONE HCL 100 MG PO TABS: 100.0000 mg | ORAL_TABLET | Freq: Every evening | ORAL | 0 refills | Status: DC | PRN

## 2022-04-21 ENCOUNTER — Other Ambulatory Visit: Payer: Self-pay | Admitting: Internal Medicine

## 2022-04-22 ENCOUNTER — Ambulatory Visit (INDEPENDENT_AMBULATORY_CARE_PROVIDER_SITE_OTHER): Payer: BC Managed Care – PPO | Admitting: Cardiology

## 2022-04-22 ENCOUNTER — Encounter: Payer: Self-pay | Admitting: Cardiology

## 2022-04-22 VITALS — BP 124/80 | HR 72 | Ht 67.0 in | Wt 223.6 lb

## 2022-04-22 DIAGNOSIS — E7800 Pure hypercholesterolemia, unspecified: Secondary | ICD-10-CM

## 2022-04-22 DIAGNOSIS — I10 Essential (primary) hypertension: Secondary | ICD-10-CM

## 2022-04-22 DIAGNOSIS — Z6835 Body mass index (BMI) 35.0-35.9, adult: Secondary | ICD-10-CM

## 2022-04-22 MED ORDER — ATORVASTATIN CALCIUM 40 MG PO TABS: 40.0000 mg | ORAL_TABLET | Freq: Every day | ORAL | 5 refills | Status: DC

## 2022-04-22 NOTE — Progress Notes (Signed)
Cardiology Office Note:    Date:  04/22/2022   ID:  Christopher Vazquez, DOB May 21, 1966, MRN [id number]  PCP:  Crecencio Mc, MD  Cardiologist:  Kate Sable, MD  Electrophysiologist:  None   Referring MD: Crecencio Mc, MD   Chief Complaint  Patient presents with   Follow-up    6 month follow up. Meds reviewed with patient.     History of Present Illness:    Christopher Vazquez is a 56 y.o. male with a hx of hypertension, hyperlipidemia, diabetes who presents for follow-up.    He was previously seen due to shortness of breath with exertion.  Stress test and echocardiogram were normal with normal aortic root and ascending aorta size.  Graduated exercises and weight loss was recommended, which patient achieved, with improvement in patient's symptoms shortness of breath.  Tolerates all medications as prescribed.  His father recently passed from complications of lung cancer and chemo.  Prior notes Echocardiogram 09/2019 showed normal systolic and diastolic function, EF 60 to 65% Stress echocardiogram 10/2020, no evidence for ischemia. His father has a history of abdominal aortic aneurysm and also thoracic aortic aneurysm.  His dad was a smoker and had an MI at age 55.    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   Diabetes mellitus without complication (HCC)    GERD (gastroesophageal reflux disease)    Hyperlipidemia    Hypertension     Past Surgical History:  Procedure Laterality Date   BIOPSY OF SKIN SUBCUTANEOUS TISSUE AND/OR MUCOUS MEMBRANE     nose   CHOLESTEATOMA EXCISION Left 2000   left ear   COLONOSCOPY WITH PROPOFOL N/A 11/17/2017   Procedure: COLONOSCOPY WITH PROPOFOL;  Surgeon: Lin Landsman, MD;  Location: [facility] ENDOSCOPY;  Service: Gastroenterology;  Laterality: N/A;   ESOPHAGOGASTRODUODENOSCOPY (EGD) WITH PROPOFOL N/A  09/16/2020   Procedure: ESOPHAGOGASTRODUODENOSCOPY (EGD) WITH PROPOFOL;  Surgeon: Lin Landsman, MD;  Location: [facility] ENDOSCOPY;  Service: Gastroenterology;  Laterality: N/A;   HERNIA REPAIR     KNEE ARTHROSCOPY WITH MEDIAL MENISECTOMY Left 07/01/2016   Procedure: left knee arthroscopy with partial medial menisectomy;  Surgeon: Hessie Knows, MD;  Location: [facility] ORS;  Service: Orthopedics;  Laterality: Left;   TONSILLECTOMY AND ADENOIDECTOMY     UMBILICAL HERNIA REPAIR N/A 08/20/2019   Procedure: LAPAROSCOPIC ASSISTED UMBILICAL HERNIA REPAIR WITH MESH;  Surgeon: Johnathan Hausen, MD;  Location: [facility] ORS;  Service: General;  Laterality: N/A;    Current Medications: Current Meds  Medication Sig   aspirin 81 MG tablet Take 81 mg by mouth at bedtime.   atorvastatin (LIPITOR) 40 MG tablet Take 1 tablet (40 mg total) by mouth daily.   fenofibrate 160 MG tablet TAKE 1 TABLET DAILY   metFORMIN (GLUCOPHAGE) 500 MG tablet TAKE 1 TABLET TWICE A DAY WITH MEALS   montelukast (SINGULAIR) 10 MG tablet TAKE 1 TABLET AT BEDTIME (NEED FOLLOW UP APPOINTMENT)   pantoprazole (PROTONIX) 40 MG tablet Take 1 tablet by mouth once daily   telmisartan-hydrochlorothiazide (MICARDIS HCT) 80-25 MG tablet Take 1 tablet by mouth daily.   traZODone (DESYREL) 100 MG tablet Take 1 tablet (100 mg total) by mouth at bedtime as needed. for sleep   [DISCONTINUED] sildenafil (REVATIO) 20 MG tablet TAKE 1 TABLET BY MOUTH THREE TIMES DAILY   [DISCONTINUED] simvastatin (ZOCOR) 20 MG tablet TAKE 1 TABLET  DAILY AT BEDTIME     Allergies:   Niacin, Shellfish allergy, Scopolamine, and Niacin and related   Social History   Socioeconomic History   Marital status: Married    Spouse name: Not on file   Number of children: 3   Years of education: Not on file   Highest education level: Not on file  Occupational History   Occupation: Buyer, retail: [employer]  Tobacco Use   Smoking status: Never   Smokeless tobacco: Never   Vaping Use   Vaping Use: Never used  Substance and Sexual Activity   Alcohol use: Not Currently   Drug use: No   Sexual activity: Yes    Birth control/protection: None    Comment: Married  Other Topics Concern   Not on file  Social History Narrative   Not on file   Social Determinants of Health   Financial Resource Strain: Not on file  Food Insecurity: Not on file  Transportation Needs: Not on file  Physical Activity: Not on file  Stress: Not on file  Social Connections: Not on file     Family History: The patient's family history includes AAA (abdominal aortic aneurysm) in his father; Aortic aneurysm (age of onset: 58) in his father; Atrial fibrillation in his son; Diabetes in his mother; Heart disease (age of onset: 92) in his father; Hypertension in his mother; Kidney cancer in his sister; Uterine cancer in his mother.  ROS:   Please see the history of present illness.     All other systems reviewed and are negative.  EKGs/Labs/Other Studies Reviewed:    The following studies were reviewed today:  EKG:  EKG not ordered today.    Recent Labs: 03/16/2022: ALT 37; BUN 21; Creatinine, Ser 1.35; Potassium 4.0; Sodium 139  Recent Lipid Panel    Component Value Date/Time   CHOL 120 03/16/2022 1001   TRIG 238.0 (H) 03/16/2022 1001   HDL 27.70 (L) 03/16/2022 1001   CHOLHDL 4 03/16/2022 1001   VLDL 47.6 (H) 03/16/2022 1001   LDLCALC 49 01/16/2021 0851   LDLCALC 63 12/08/2018 1556   LDLDIRECT 77.0 03/16/2022 1001    Physical Exam:    VS:  BP 124/80 (BP Location: Left Arm, Patient Position: Sitting, Cuff Size: Normal)   Pulse 72   Ht '5\' 7"'$  (1.702 m)   Wt 223 lb 9.6 oz (101.4 kg)   SpO2 97%   BMI 35.02 kg/m     Wt Readings from Last 3 Encounters:  04/22/22 223 lb 9.6 oz (101.4 kg)  03/16/22 220 lb 6.4 oz (100 kg)  09/07/21 212 lb 3.2 oz (96.3 kg)     GEN:  Well nourished, well developed in no acute distress HEENT: Normal NECK: No JVD; No carotid  bruits LYMPHATICS: No lymphadenopathy CARDIAC: RRR, no murmurs, rubs, gallops RESPIRATORY:  Clear to auscultation without rales, wheezing or rhonchi  ABDOMEN: Soft, non-tender, non-distended MUSCULOSKELETAL:  No edema; No deformity  SKIN: Warm and dry NEUROLOGIC:  Alert and oriented x 3 PSYCHIATRIC:  Normal affect   ASSESSMENT:    1. Primary hypertension   2. Pure hypercholesterolemia   3. BMI 35.0-35.9,adult     PLAN:    In order of problems listed above:  Hypertension, BP controlled.  Continue telmisartan HCTZ Hyperlipidemia, triglycerides elevated, stop simvastatin, start Lipitor 40 mg daily.  Recheck lipid panel in 3 months.  Obesity, continue low calorie diet and weight loss.    Follow-up in 6 months if cholesterol controlled,  follow-up as needed after.  Medication Adjustments/Labs and Tests Ordered: Current medicines are reviewed at length with the patient today.  Concerns regarding medicines are outlined above.  Orders Placed This Encounter  Procedures   Lipid panel   EKG 12-Lead    Meds ordered this encounter  Medications   atorvastatin (LIPITOR) 40 MG tablet    Sig: Take 1 tablet (40 mg total) by mouth daily.    Dispense:  30 tablet    Refill:  5     Patient Instructions  Medication Instructions:   Your physician has recommended you make the following change in your medication:     STOP taking Simvastatin.  2.    START taking Atorvastatin 40 MG once a day.   *If you need a refill on your cardiac medications before your next appointment, please call your pharmacy*   Lab Work:  Your physician recommends that you return for a FASTING lipid profile: In 3 months  - You will need to be fasting. Please do not have anything to eat or drink after midnight the morning you have the lab work. You may only have water or black coffee with no cream or sugar.   - Please go to the [facility]. You will check in at the front desk to the right as you walk  into the atrium. Valet Parking is offered if needed. - No appointment needed. You may go any day between 7 am and 6 pm.     Follow-Up: At [facility], you and your health needs are our priority.  As part of our continuing mission to provide you with exceptional heart care, we have created designated Provider Care Teams.  These Care Teams include your primary Cardiologist (physician) and Advanced Practice Providers (APPs -  Physician Assistants and Nurse Practitioners) who all work together to provide you with the care you need, when you need it.  We recommend signing up for the patient portal called "MyChart".  Sign up information is provided on this After Visit Summary.  MyChart is used to connect with patients for Virtual Visits (Telemedicine).  Patients are able to view lab/test results, encounter notes, upcoming appointments, etc.  Non-urgent messages can be sent to your provider as well.   To learn more about what you can do with MyChart, go to [email or web address].    Your next appointment:   6 month(s)  The format for your next appointment:   In Person  Provider:   You may see Kate Sable, MD or one of the following Advanced Practice Providers on your designated Care Team:   Murray Hodgkins, NP Christell Faith, PA-C Cadence Kathlen Mody, [place]    Other Instructions   Important Information About Sugar          Signed, Kate Sable, MD  04/22/2022 9:41 AM    [place]

## 2022-04-22 NOTE — Patient Instructions (Signed)
Medication Instructions:   Your physician has recommended you make the following change in your medication:     STOP taking Simvastatin.  2.    START taking Atorvastatin 40 MG once a day.   *If you need a refill on your cardiac medications before your next appointment, please call your pharmacy*   Lab Work:  Your physician recommends that you return for a FASTING lipid profile: In 3 months  - You will need to be fasting. Please do not have anything to eat or drink after midnight the morning you have the lab work. You may only have water or black coffee with no cream or sugar.   - Please go to the [facility]. You will check in at the front desk to the right as you walk into the atrium. Valet Parking is offered if needed. - No appointment needed. You may go any day between 7 am and 6 pm.     Follow-Up: At [facility], you and your health needs are our priority.  As part of our continuing mission to provide you with exceptional heart care, we have created designated Provider Care Teams.  These Care Teams include your primary Cardiologist (physician) and Advanced Practice Providers (APPs -  Physician Assistants and Nurse Practitioners) who all work together to provide you with the care you need, when you need it.  We recommend signing up for the patient portal called "MyChart".  Sign up information is provided on this After Visit Summary.  MyChart is used to connect with patients for Virtual Visits (Telemedicine).  Patients are able to view lab/test results, encounter notes, upcoming appointments, etc.  Non-urgent messages can be sent to your provider as well.   To learn more about what you can do with MyChart, go to [email or web address].    Your next appointment:   6 month(s)  The format for your next appointment:   In Person  Provider:   You may see Kate Sable, MD or one of the following Advanced Practice Providers on your designated Care Team:   Murray Hodgkins, NP Christell Faith, PA-C Cadence Kathlen Mody, [place]    Other Instructions   Important Information About Sugar

## 2022-04-23 ENCOUNTER — Other Ambulatory Visit: Payer: Self-pay

## 2022-04-23 MED ORDER — TELMISARTAN-HCTZ 80-25 MG PO TABS: 1.0000 | ORAL_TABLET | Freq: Every day | ORAL | 3 refills | Status: DC

## 2022-04-23 MED ORDER — TRAZODONE HCL 100 MG PO TABS: 100.0000 mg | ORAL_TABLET | Freq: Every evening | ORAL | 0 refills | Status: DC | PRN

## 2022-04-23 MED ORDER — MONTELUKAST SODIUM 10 MG PO TABS: ORAL_TABLET | ORAL | 3 refills | Status: DC

## 2022-04-25 ENCOUNTER — Other Ambulatory Visit: Payer: Self-pay | Admitting: Internal Medicine

## 2022-05-12 ENCOUNTER — Other Ambulatory Visit: Payer: Self-pay | Admitting: Internal Medicine

## 2022-05-20 ENCOUNTER — Other Ambulatory Visit: Payer: Self-pay | Admitting: Internal Medicine

## 2022-06-07 ENCOUNTER — Ambulatory Visit (INDEPENDENT_AMBULATORY_CARE_PROVIDER_SITE_OTHER): Payer: BC Managed Care – PPO | Admitting: Internal Medicine

## 2022-06-07 ENCOUNTER — Encounter: Payer: Self-pay | Admitting: Internal Medicine

## 2022-06-07 VITALS — BP 118/70 | HR 61 | Temp 97.9°F | Resp 20 | Ht 67.0 in | Wt 226.0 lb

## 2022-06-07 DIAGNOSIS — E1159 Type 2 diabetes mellitus with other circulatory complications: Secondary | ICD-10-CM

## 2022-06-07 DIAGNOSIS — S29012A Strain of muscle and tendon of back wall of thorax, initial encounter: Secondary | ICD-10-CM

## 2022-06-07 DIAGNOSIS — E1169 Type 2 diabetes mellitus with other specified complication: Secondary | ICD-10-CM

## 2022-06-07 DIAGNOSIS — E782 Mixed hyperlipidemia: Secondary | ICD-10-CM

## 2022-06-07 DIAGNOSIS — I152 Hypertension secondary to endocrine disorders: Secondary | ICD-10-CM

## 2022-06-07 DIAGNOSIS — K760 Fatty (change of) liver, not elsewhere classified: Secondary | ICD-10-CM | POA: Diagnosis not present

## 2022-06-07 DIAGNOSIS — E119 Type 2 diabetes mellitus without complications: Secondary | ICD-10-CM

## 2022-06-07 DIAGNOSIS — E669 Obesity, unspecified: Secondary | ICD-10-CM

## 2022-06-07 MED ORDER — TIZANIDINE HCL 4 MG PO TABS: 4.0000 mg | ORAL_TABLET | Freq: Three times a day (TID) | ORAL | 1 refills | Status: DC | PRN

## 2022-06-07 NOTE — Patient Instructions (Addendum)
You have strained your latissimus dorsi  muscles  You can use 2 aleve (Naproxen) every 12 hours along with acetaminophen (tylenol) 500 to 1000 mg twice daily  for the next 2 weeks   Adding  tizanidine to relax the muscles that are in spasm   Ok to use heat if it helps.

## 2022-06-07 NOTE — Progress Notes (Signed)
Subjective:  Patient ID: Christopher Vazquez, male    DOB: Sep 06, 1966  Age: 56 y.o. MRN: [id number]  CC: The primary encounter diagnosis was Hepatic steatosis. Diagnoses of Mixed hyperlipidemia, Obesity, diabetes, and hypertension syndrome ([place]), and Strain of latissimus dorsi muscle, initial encounter were also pertinent to this visit.   HPI Christopher Vazquez presents for  Chief Complaint  Patient presents with   Back Pain    Back pain   1) 1 WEEK HISTORY OF LOW BACK PAIN  which occurred after lifting some furniture. The pain is aggravated certain movements of arms   pain is right sided and does not radiate.  The overuse episode occurred  10 days. ago .  Her pain  currently aches constantly  even when supine unless he is lying on left side with right arm positioned over his head.  Started taking ibuprofen yesterday 600 mg dose x 1    Outpatient Medications Prior to Visit  Medication Sig Dispense Refill   ALPRAZolam (XANAX) 0.25 MG tablet Take 1 tablet (0.25 mg total) by mouth at bedtime as needed for anxiety. 30 tablet 0   aspirin 81 MG tablet Take 81 mg by mouth at bedtime.     atorvastatin (LIPITOR) 40 MG tablet Take 1 tablet (40 mg total) by mouth daily. 30 tablet 5   fenofibrate 160 MG tablet TAKE 1 TABLET DAILY 90 tablet 3   metFORMIN (GLUCOPHAGE) 500 MG tablet TAKE 1 TABLET TWICE A DAY WITH MEALS 180 tablet 3   montelukast (SINGULAIR) 10 MG tablet TAKE 1 TABLET AT BEDTIME 90 tablet 3   pantoprazole (PROTONIX) 40 MG tablet Take 1 tablet by mouth once daily 90 tablet 1   sildenafil (REVATIO) 20 MG tablet TAKE 1 TABLET BY MOUTH THREE TIMES DAILY 90 tablet 0   telmisartan-hydrochlorothiazide (MICARDIS HCT) 80-25 MG tablet Take 1 tablet by mouth daily. 90 tablet 3   traZODone (DESYREL) 100 MG tablet Take 1 tablet (100 mg total) by mouth at bedtime as needed. for sleep 90 tablet 0   No facility-administered medications prior to visit.    Review of Systems;  Patient denies headache,  fevers, malaise, unintentional weight loss, skin rash, eye pain, sinus congestion and sinus pain, sore throat, dysphagia,  hemoptysis , cough, dyspnea, wheezing, chest pain, palpitations, orthopnea, edema, abdominal pain, nausea, melena, diarrhea, constipation, flank pain, dysuria, hematuria, urinary  Frequency, nocturia, numbness, tingling, seizures,  Focal weakness, Loss of consciousness,  Tremor, insomnia, depression, anxiety, and suicidal ideation.      Objective:  BP 118/70 (BP Location: Left Arm, Patient Position: Sitting, Cuff Size: Normal)   Pulse 61   Temp 97.9 F (36.6 C) (Oral)   Resp 20   Ht '5\' 7"'$  (1.702 m)   Wt 226 lb (102.5 kg)   SpO2 96%   BMI 35.40 kg/m   BP Readings from Last 3 Encounters:  06/07/22 118/70  04/22/22 124/80  03/16/22 110/78    Wt Readings from Last 3 Encounters:  06/07/22 226 lb (102.5 kg)  04/22/22 223 lb 9.6 oz (101.4 kg)  03/16/22 220 lb 6.4 oz (100 kg)    General appearance: alert, cooperative and appears stated age Ears: normal TM's and external ear canals both ears Throat: lips, mucosa, and tongue normal; teeth and gums normal Neck: no adenopathy, no carotid bruit, supple, symmetrical, trachea midline and thyroid not enlarged, symmetric, no tenderness/mass/nodules Back: symmetric, no curvature. ROM normal. No CVA tenderness. Right sided mid thoracic pain , no bruising  Lungs:  clear to auscultation bilaterally Heart: regular rate and rhythm, S1, S2 normal, no murmur, click, rub or gallop Abdomen: soft, non-tender; bowel sounds normal; no masses,  no organomegaly Pulses: 2+ and symmetric Skin: Skin color, texture, turgor normal. No rashes or lesions Lymph nodes: Cervical, supraclavicular, and axillary nodes normal.  Lab Results  Component Value Date   HGBA1C 6.1 03/16/2022   HGBA1C 5.9 08/31/2021   HGBA1C 6.2 01/16/2021    Lab Results  Component Value Date   CREATININE 1.35 03/16/2022   CREATININE 1.34 08/31/2021   CREATININE  1.35 01/16/2021    Lab Results  Component Value Date   WBC 5.8 05/26/2020   HGB 15.0 05/26/2020   HCT 42 05/26/2020   PLT 246 05/26/2020   GLUCOSE 91 03/16/2022   CHOL 120 03/16/2022   TRIG 238.0 (H) 03/16/2022   HDL 27.70 (L) 03/16/2022   LDLDIRECT 77.0 03/16/2022   LDLCALC 49 01/16/2021   ALT 37 03/16/2022   AST 28 03/16/2022   NA 139 03/16/2022   K 4.0 03/16/2022   CL 103 03/16/2022   CREATININE 1.35 03/16/2022   BUN 21 03/16/2022   CO2 28 03/16/2022   TSH 3.30 05/26/2020   PSA 0.57 08/31/2021   HGBA1C 6.1 03/16/2022   MICROALBUR <0.7 03/16/2022    No results found.  Assessment & Plan:   Problem List Items Addressed This Visit     Hepatic steatosis - Primary   Relevant Orders   Comprehensive metabolic panel   Hyperlipidemia   Relevant Orders   Lipid panel   Obesity, diabetes, and hypertension syndrome ([place])   Relevant Orders   Hemoglobin A1c   Strain of latissimus dorsi muscle    Right side, secondary to overuse  .  nsaids and tylenol , prn tizanidine        I spent a total of   minutes with this patient in a face to face visit on the date of this encounter reviewing the last office visit with me on        ,  most recent with patient's cardiologist in    ,  patient'ss diet and eating habits, home blood pressure readings ,  most recent imaging study ,   and post visit ordering of testing and therapeutics.    Follow-up: Return in about 6 months (around 12/08/2022).   Crecencio Mc, MD

## 2022-06-07 NOTE — Assessment & Plan Note (Signed)
Right side, secondary to overuse  .  nsaids and tylenol , prn tizanidine

## 2022-06-08 ENCOUNTER — Other Ambulatory Visit: Payer: Self-pay | Admitting: Internal Medicine

## 2022-06-17 ENCOUNTER — Other Ambulatory Visit (INDEPENDENT_AMBULATORY_CARE_PROVIDER_SITE_OTHER): Payer: BC Managed Care – PPO

## 2022-06-17 DIAGNOSIS — I152 Hypertension secondary to endocrine disorders: Secondary | ICD-10-CM | POA: Diagnosis not present

## 2022-06-17 DIAGNOSIS — E669 Obesity, unspecified: Secondary | ICD-10-CM

## 2022-06-17 DIAGNOSIS — E782 Mixed hyperlipidemia: Secondary | ICD-10-CM | POA: Diagnosis not present

## 2022-06-17 DIAGNOSIS — K760 Fatty (change of) liver, not elsewhere classified: Secondary | ICD-10-CM | POA: Diagnosis not present

## 2022-06-17 DIAGNOSIS — E1169 Type 2 diabetes mellitus with other specified complication: Secondary | ICD-10-CM | POA: Diagnosis not present

## 2022-06-17 DIAGNOSIS — E1159 Type 2 diabetes mellitus with other circulatory complications: Secondary | ICD-10-CM | POA: Diagnosis not present

## 2022-06-17 DIAGNOSIS — E119 Type 2 diabetes mellitus without complications: Secondary | ICD-10-CM

## 2022-06-17 LAB — COMPREHENSIVE METABOLIC PANEL
ALT: 35 U/L (ref 0–53)
AST: 25 U/L (ref 0–37)
Albumin: 4.2 g/dL (ref 3.5–5.2)
Alkaline Phosphatase: 41 U/L (ref 39–117)
BUN: 18 mg/dL (ref 6–23)
CO2: 25 mEq/L (ref 19–32)
Calcium: 9.2 mg/dL (ref 8.4–10.5)
Chloride: 105 mEq/L (ref 96–112)
Creatinine, Ser: 1.32 mg/dL (ref 0.40–1.50)
GFR: 60.83 mL/min (ref >60.00)
Glucose, Bld: 115 mg/dL — ABNORMAL HIGH (ref 70–99)
Potassium: 3.9 mEq/L (ref 3.5–5.1)
Sodium: 137 mEq/L (ref 135–145)
Total Bilirubin: 0.3 mg/dL (ref 0.2–1.2)
Total Protein: 6.8 g/dL (ref 6.0–8.3)

## 2022-06-17 LAB — LIPID PANEL
Cholesterol: 110 mg/dL (ref 0–200)
HDL: 25.4 mg/dL — ABNORMAL LOW (ref >39.00)
LDL Cholesterol: 54 mg/dL (ref 0–99)
NonHDL: 84.95
Total CHOL/HDL Ratio: 4
Triglycerides: 157 mg/dL — ABNORMAL HIGH (ref 0.0–149.0)
VLDL: 31.4 mg/dL (ref 0.0–40.0)

## 2022-06-17 LAB — HEMOGLOBIN A1C: Hgb A1c MFr Bld: 6.4 % (ref 4.6–6.5)

## 2022-06-28 LAB — HM DIABETES EYE EXAM

## 2022-07-08 ENCOUNTER — Other Ambulatory Visit: Payer: Self-pay | Admitting: Internal Medicine

## 2022-07-19 ENCOUNTER — Other Ambulatory Visit: Payer: Self-pay | Admitting: Internal Medicine

## 2022-07-29 ENCOUNTER — Other Ambulatory Visit
Admission: RE | Admit: 2022-07-29 | Discharge: 2022-07-29 | Disposition: A | Discharge status: Home / Self Care | Payer: BC Managed Care – PPO | Attending: Cardiology | Admitting: Cardiology

## 2022-07-29 DIAGNOSIS — Z6835 Body mass index (BMI) 35.0-35.9, adult: Secondary | ICD-10-CM | POA: Insufficient documentation

## 2022-07-29 DIAGNOSIS — E7800 Pure hypercholesterolemia, unspecified: Secondary | ICD-10-CM | POA: Diagnosis not present

## 2022-07-29 LAB — LIPID PANEL
Cholesterol: 115 mg/dL (ref 0–200)
HDL: 27 mg/dL — ABNORMAL LOW (ref >40)
LDL Cholesterol: 32 mg/dL (ref 0–99)
Total CHOL/HDL Ratio: 4.3 RATIO
Triglycerides: 281 mg/dL — ABNORMAL HIGH (ref <150)
VLDL: 56 mg/dL — ABNORMAL HIGH (ref 0–40)

## 2022-07-29 NOTE — Addendum Note (Signed)
Addended by: Valora Corporal on: 07/29/2022 09:11 AM   Modules accepted: Orders

## 2022-07-30 ENCOUNTER — Telehealth: Payer: Self-pay

## 2022-07-30 DIAGNOSIS — E785 Hyperlipidemia, unspecified: Secondary | ICD-10-CM

## 2022-07-30 MED ORDER — ATORVASTATIN CALCIUM 80 MG PO TABS: 80.0000 mg | ORAL_TABLET | Freq: Every day | ORAL | 1 refills | Status: DC

## 2022-07-30 NOTE — Telephone Encounter (Signed)
-----   Message from Kate Sable, MD sent at 07/29/2022  1:48 PM EDT ----- Cholesterol still elevated, increase Lipitor to 80 mg.  Repeat lipid panel in 3 months.

## 2022-07-30 NOTE — Telephone Encounter (Signed)
The patient has been notified of the result and verbalized understanding.  All questions (if any) were answered. Kavin Leech, RN 07/30/2022 1:22 PM

## 2022-08-04 ENCOUNTER — Other Ambulatory Visit: Payer: Self-pay | Admitting: Internal Medicine

## 2022-08-04 NOTE — Telephone Encounter (Signed)
Refilled: 07/08/2022 Last OV:  06/07/2022 Next OV: 12/08/2022

## 2022-08-31 ENCOUNTER — Other Ambulatory Visit: Payer: Self-pay | Admitting: Internal Medicine

## 2022-09-05 ENCOUNTER — Other Ambulatory Visit: Payer: Self-pay | Admitting: Internal Medicine

## 2022-09-13 ENCOUNTER — Telehealth: Payer: BC Managed Care – PPO | Admitting: Physician Assistant

## 2022-09-13 DIAGNOSIS — U071 COVID-19: Secondary | ICD-10-CM | POA: Diagnosis not present

## 2022-09-13 MED ORDER — MOLNUPIRAVIR EUA 200MG CAPSULE: 4.0000 | ORAL_CAPSULE | Freq: Two times a day (BID) | ORAL | 0 refills | Status: AC

## 2022-09-13 NOTE — Progress Notes (Signed)
Virtual Visit Consent   Christopher Vazquez, you are scheduled for a virtual visit with a [facility] provider today. Just as with appointments in the office, your consent must be obtained to participate. Your consent will be active for this visit and any virtual visit you may have with one of our providers in the next 365 days. If you have a MyChart account, a copy of this consent can be sent to you electronically.  As this is a virtual visit, video technology does not allow for your provider to perform a traditional examination. This may limit your provider's ability to fully assess your condition. If your provider identifies any concerns that need to be evaluated in person or the need to arrange testing (such as labs, EKG, etc.), we will make arrangements to do so. Although advances in technology are sophisticated, we cannot ensure that it will always work on either your end or our end. If the connection with a video visit is poor, the visit may have to be switched to a telephone visit. With either a video or telephone visit, we are not always able to ensure that we have a secure connection.  By engaging in this virtual visit, you consent to the provision of healthcare and authorize for your insurance to be billed (if applicable) for the services provided during this visit. Depending on your insurance coverage, you may receive a charge related to this service.  I need to obtain your verbal consent now. Are you willing to proceed with your visit today? Christopher Vazquez has provided verbal consent on 09/13/2022 for a virtual visit (video or telephone). [name], PA-C  Date: 09/13/2022 9:33 AM  Virtual Visit via Video Note   I, [name], connected with  Christopher Vazquez  ([id number], 1965-12-19) on 09/13/22 at  9:30 AM EST by a video-enabled telemedicine application and verified that I am speaking with the correct person using two identifiers.  Location: Patient: Virtual Visit Location  Patient: Home Provider: Virtual Visit Location Provider: Home Office   I discussed the limitations of evaluation and management by telemedicine and the availability of in person appointments. The patient expressed understanding and agreed to proceed.    History of Present Illness: Christopher Vazquez is a 56 y.o. who identifies as a male who was assigned male at birth, and is being seen today for Covid 73.  HPI: URI  This is a new problem. The current episode started in the past 7 days (Tested positive for Covid 19 on at home test today; Symptoms started about 2 days ago). The problem has been gradually worsening. Maximum temperature: 99.5. Associated symptoms include congestion, coughing (,ild), ear pain (left), headaches, nausea (mild), a plugged ear sensation, rhinorrhea (post nasal drainage), sinus pain and a sore throat. Pertinent negatives include no diarrhea or vomiting. Associated symptoms comments: Chills, low grade fever, fatigue, myalgias. He has tried acetaminophen for the symptoms. The treatment provided no relief.     Problems:  Patient Active Problem List   Diagnosis Date Noted   Strain of latissimus dorsi muscle 06/07/2022   Grief 03/16/2022   Insomnia 09/08/2021   Exertional dyspnea 11/02/2020   Esophageal dysphagia    Disorder of bursae of shoulder region 09/08/2020   Ectatic abdominal aorta ([place]) 09/04/2020   OSA (obstructive sleep apnea) 08/07/2020   Witnessed apneic spells 07/12/2020   Pain in joint of left shoulder 11/16/2019   Encounter for preventive health examination 10/23/2017   Obesity, diabetes, and hypertension  syndrome ([place]) 08/18/2015   Vitamin D deficiency 02/19/2014   Seasonal allergic rhinitis 02/02/2014   Dysphagia, unspecified(787.20) 03/07/2013   Hepatic steatosis 03/07/2013   Hyperlipidemia    Essential hypertension     Allergies:  Allergies  Allergen Reactions   Niacin Hives, Itching and Rash    Pins and needles.   Shellfish Allergy  Anaphylaxis and Itching    throat starts to become scratchy, denies difficulty breathing or swelling of mouth, lips or throat. Denies problems with betadine topically. Other reaction(s): Other (See Comments) Dryness and tightness in  throat throat starts to become scratchy, denies difficulty breathing or swelling of mouth, lips or throat. Denies problems with betadine topically.   Scopolamine Other (See Comments)    vision Other reaction(s): Other (See Comments), Other (See Comments) vision vision   Niacin And Related Rash    Pens and needle sensation   Medications:  Current Outpatient Medications:    molnupiravir EUA (LAGEVRIO) 200 mg CAPS capsule, Take 4 capsules (800 mg total) by mouth 2 (two) times daily for 5 days., Disp: 40 capsule, Rfl: 0   ALPRAZolam (XANAX) 0.25 MG tablet, Take 1 tablet (0.25 mg total) by mouth at bedtime as needed for anxiety., Disp: 30 tablet, Rfl: 0   aspirin 81 MG tablet, Take 81 mg by mouth at bedtime., Disp: , Rfl:    atorvastatin (LIPITOR) 80 MG tablet, Take 1 tablet (80 mg total) by mouth daily., Disp: 90 tablet, Rfl: 1   fenofibrate 160 MG tablet, TAKE 1 TABLET DAILY, Disp: 90 tablet, Rfl: 3   metFORMIN (GLUCOPHAGE) 500 MG tablet, TAKE 1 TABLET TWICE A DAY  WITH MEALS, Disp: 180 tablet, Rfl: 3   montelukast (SINGULAIR) 10 MG tablet, TAKE 1 TABLET AT BEDTIME, Disp: 90 tablet, Rfl: 3   pantoprazole (PROTONIX) 40 MG tablet, Take 1 tablet by mouth once daily, Disp: 90 tablet, Rfl: 1   sildenafil (REVATIO) 20 MG tablet, TAKE 1 TABLET BY MOUTH THREE TIMES DAILY, Disp: 90 tablet, Rfl: 0   telmisartan-hydrochlorothiazide (MICARDIS HCT) 80-25 MG tablet, Take 1 tablet by mouth daily., Disp: 90 tablet, Rfl: 3   tiZANidine (ZANAFLEX) 4 MG tablet, Take 1 tablet (4 mg total) by mouth every 8 (eight) hours as needed for muscle spasms., Disp: 60 tablet, Rfl: 1   traZODone (DESYREL) 100 MG tablet, TAKE 1 TABLET AT BEDTIME ASNEEDED FOR SLEEP, Disp: 90 tablet, Rfl:  0  Observations/Objective: Patient is well-developed, well-nourished in no acute distress.  Resting comfortably at home.  Head is normocephalic, atraumatic.  No labored breathing.  Speech is clear and coherent with logical content.  Patient is alert and oriented at baseline.    Assessment and Plan: 1. COVID-19 - molnupiravir EUA (LAGEVRIO) 200 mg CAPS capsule; Take 4 capsules (800 mg total) by mouth 2 (two) times daily for 5 days.  Dispense: 40 capsule; Refill: 0 - MyChart COVID-19 home monitoring program; Future  - Continue OTC symptomatic management of choice - Will send OTC vitamins and supplement information through AVS - Molnupiravir prescribed - Patient enrolled in MyChart symptom monitoring - Push fluids - Rest as needed - Discussed return precautions and when to seek in-person evaluation, sent via [facility] as well   Follow Up Instructions: I discussed the assessment and treatment plan with the patient. The patient was provided an opportunity to ask questions and all were answered. The patient agreed with the plan and demonstrated an understanding of the instructions.  A copy of instructions were sent to the patient via  MyChart unless otherwise noted below.    The patient was advised to call back or seek an in-person evaluation if the symptoms worsen or if the condition fails to improve as anticipated.  Time:  I spent 10 minutes with the patient via telehealth technology discussing the above problems/concerns.    [name], PA-C

## 2022-09-13 NOTE — Patient Instructions (Signed)
Christopher Vazquez, thank you for joining [name], PA-C for today's virtual visit.  While this provider is not your primary care provider (PCP), if your PCP is located in our provider database this encounter information will be shared with them immediately following your visit.   [facility] account gives you access to today's visit and all your visits, tests, and labs performed at [facility] " click here if you don't have a [facility] account or go to [email or web address]  Consent: (Patient) Christopher Vazquez provided verbal consent for this virtual visit at the beginning of the encounter.  Current Medications:  Current Outpatient Medications:    molnupiravir EUA (LAGEVRIO) 200 mg CAPS capsule, Take 4 capsules (800 mg total) by mouth 2 (two) times daily for 5 days., Disp: 40 capsule, Rfl: 0   ALPRAZolam (XANAX) 0.25 MG tablet, Take 1 tablet (0.25 mg total) by mouth at bedtime as needed for anxiety., Disp: 30 tablet, Rfl: 0   aspirin 81 MG tablet, Take 81 mg by mouth at bedtime., Disp: , Rfl:    atorvastatin (LIPITOR) 80 MG tablet, Take 1 tablet (80 mg total) by mouth daily., Disp: 90 tablet, Rfl: 1   fenofibrate 160 MG tablet, TAKE 1 TABLET DAILY, Disp: 90 tablet, Rfl: 3   metFORMIN (GLUCOPHAGE) 500 MG tablet, TAKE 1 TABLET TWICE A DAY  WITH MEALS, Disp: 180 tablet, Rfl: 3   montelukast (SINGULAIR) 10 MG tablet, TAKE 1 TABLET AT BEDTIME, Disp: 90 tablet, Rfl: 3   pantoprazole (PROTONIX) 40 MG tablet, Take 1 tablet by mouth once daily, Disp: 90 tablet, Rfl: 1   sildenafil (REVATIO) 20 MG tablet, TAKE 1 TABLET BY MOUTH THREE TIMES DAILY, Disp: 90 tablet, Rfl: 0   telmisartan-hydrochlorothiazide (MICARDIS HCT) 80-25 MG tablet, Take 1 tablet by mouth daily., Disp: 90 tablet, Rfl: 3   tiZANidine (ZANAFLEX) 4 MG tablet, Take 1 tablet (4 mg total) by mouth every 8 (eight) hours as needed for muscle spasms., Disp: 60 tablet, Rfl: 1   traZODone (DESYREL) 100  MG tablet, TAKE 1 TABLET AT BEDTIME ASNEEDED FOR SLEEP, Disp: 90 tablet, Rfl: 0   Medications ordered in this encounter:  Meds ordered this encounter  Medications   molnupiravir EUA (LAGEVRIO) 200 mg CAPS capsule    Sig: Take 4 capsules (800 mg total) by mouth 2 (two) times daily for 5 days.    Dispense:  40 capsule    Refill:  0    Order Specific Question:   Supervising Provider    Answer:   Chase Picket [id number]     *If you need refills on other medications prior to your next appointment, please contact your pharmacy*  Follow-Up: Call back or seek an in-person evaluation if the symptoms worsen or if the condition fails to improve as anticipated.  [place] [phone]  Other Instructions  Can take to lessen severity: Vit C '500mg'$  twice daily Quercertin 250-'500mg'$  twice daily Zinc 75-'100mg'$  daily Melatonin 3-6 mg at bedtime Vit D3 1000-2000 IU daily Aspirin 81 mg daily with food Optional: Famotidine '20mg'$  daily Also can add tylenol/ibuprofen as needed for fevers and body aches May add Mucinex or Mucinex DM as needed for cough/congestion   COVID-19 COVID-19, or coronavirus disease 2019, is an infection that is caused by a new (novel) coronavirus called SARS-CoV-2. COVID-19 can cause many symptoms. In some people, the virus may not cause any symptoms. In others, it may cause mild or severe symptoms. Some  people with severe infection develop severe disease. What are the causes? This illness is caused by a virus. The virus may be in the air as tiny specks of fluid (aerosols) or droplets, or it may be on surfaces. You may catch the virus by: Breathing in droplets from an infected person. Droplets can be spread by a person breathing, speaking, singing, coughing, or sneezing. Touching something, like a table or a doorknob, that has virus on it (is contaminated) and then touching your mouth, nose, or eyes. What increases the risk? Risk for infection: You are  more likely to get infected with the COVID-19 virus if: You are within 6 ft (1.8 m) of a person with COVID-19 for 15 minutes or longer. You are providing care for a person who is infected with COVID-19. You are in close personal contact with other people. Close personal contact includes hugging, kissing, or sharing eating or drinking utensils. Risk for serious illness caused by COVID-19: You are more likely to get seriously ill from the COVID-19 virus if: You have cancer. You have a long-term (chronic) disease, such as: Chronic lung disease. This includes pulmonary embolism, chronic obstructive pulmonary disease, and cystic fibrosis. Long-term disease that lowers your body's ability to fight infection (immunocompromise). Serious cardiac conditions, such as heart failure, coronary artery disease, or cardiomyopathy. Diabetes. Chronic kidney disease. Liver diseases. These include cirrhosis, nonalcoholic fatty liver disease, alcoholic liver disease, or autoimmune hepatitis. You have obesity. You are pregnant or were recently pregnant. You have sickle cell disease. What are the signs or symptoms? Symptoms of this condition can range from mild to severe. Symptoms may appear any time from 2 to 14 days after being exposed to the virus. They include: Fever or chills. Shortness of breath or trouble breathing. Feeling tired or very tired. Headaches, body aches, or muscle aches. Runny or stuffy nose, sneezing, coughing, or sore throat. New loss of taste or smell. This is rare. Some people may also have stomach problems, such as nausea, vomiting, or diarrhea. Other people may not have any symptoms of COVID-19. How is this diagnosed? This condition may be diagnosed by testing samples to check for the COVID-19 virus. The most common tests are the PCR test and the antigen test. Tests may be done in the lab or at home. They include: Using a swab to take a sample of fluid from the back of your nose and  throat (nasopharyngeal fluid), from your nose, or from your throat. Testing a sample of saliva from your mouth. Testing a sample of coughed-up mucus from your lungs (sputum). How is this treated? Treatment for COVID-19 infection depends on the severity of the condition. Mild symptoms can be managed at home with rest, fluids, and over-the-counter medicines. Serious symptoms may be treated in a hospital intensive care unit (ICU). Treatment in the ICU may include: Supplemental oxygen. Extra oxygen is given through a tube in the nose, a face mask, or a hood. Medicines. These may include: Antivirals, such as monoclonal antibodies. These help your body fight off certain viruses that can cause disease. Anti-inflammatories, such as corticosteroids. These reduce inflammation and suppress the immune system. Antithrombotics. These prevent or treat blood clots, if they develop. Convalescent plasma. This helps boost your immune system, if you have an underlying immunosuppressive condition or are getting immunosuppressive treatments. Prone positioning. This means you will lie on your stomach. This helps oxygen to get into your lungs. Infection control measures. If you are at risk for more serious illness caused by  COVID-19, your health care provider may prescribe two long-acting monoclonal antibodies, given together every 6 months. How is this prevented? To protect yourself: Use preventive medicine (pre-exposure prophylaxis). You may get pre-exposure prophylaxis if you have moderate or severe immunocompromise. Get vaccinated. Anyone 41 months old or older who meets guidelines can get a COVID-19 vaccine or vaccine series. This includes people who are pregnant or making breast milk (lactating). Get an added dose of COVID-19 vaccine after your first vaccine or vaccine series if you have moderate to severe immunocompromise. This applies if you have had a solid organ transplant or have been diagnosed with an  immunocompromising condition. You should get the added dose 4 weeks after you got the first COVID-19 vaccine or vaccine series. If you get an mRNA vaccine, you will need a 3-dose primary series. If you get the J&J/Janssen vaccine, you will need a 2-dose primary series, with the second dose being an mRNA vaccine. Talk to your health care provider about getting experimental monoclonal antibodies. This treatment is approved under emergency use authorization to prevent severe illness before or after being exposed to the COVID-19 virus. You may be given monoclonal antibodies if: You have moderate or severe immunocompromise. This includes treatments that lower your immune response. People with immunocompromise may not develop protection against COVID-19 when they are vaccinated. You cannot be vaccinated. You may not get a vaccine if you have a severe allergic reaction to the vaccine or its components. You are not fully vaccinated. You are in a facility where COVID-19 is present and: Are in close contact with a person who is infected with the COVID-19 virus. Are at high risk of being exposed to the COVID-19 virus. You are at risk of illness from new variants of the COVID-19 virus. To protect others: If you have symptoms of COVID-19, take steps to prevent the virus from spreading to others. Stay home. Leave your house only to get medical care. Do not use public transit, if possible. Do not travel while you are sick. Wash your hands often with soap and water for at least 20 seconds. If soap and water are not available, use alcohol-based hand sanitizer. Make sure that all people in your household wash their hands well and often. Cough or sneeze into a tissue or your sleeve or elbow. Do not cough or sneeze into your hand or into the air. Where to find more information Centers for Disease Control and Prevention: CharmCourses.be World Health Organization: https://www.castaneda.info/ Get  help right away if: You have trouble breathing. You have pain or pressure in your chest. You are confused. You have bluish lips and fingernails. You have trouble waking from sleep. You have symptoms that get worse. These symptoms may be an emergency. Get help right away. Call 911. Do not wait to see if the symptoms will go away. Do not drive yourself to the hospital. Summary COVID-19 is an infection that is caused by a new coronavirus. Sometimes, there are no symptoms. Other times, symptoms range from mild to severe. Some people with a severe COVID-19 infection develop severe disease. The virus that causes COVID-19 can spread from person to person through droplets or aerosols from breathing, speaking, singing, coughing, or sneezing. Mild symptoms of COVID-19 can be managed at home with rest, fluids, and over-the-counter medicines. This information is not intended to replace advice given to you by your health care provider. Make sure you discuss any questions you have with your health care provider. Document Revised: 10/13/2021 Document Reviewed: 10/15/2021  Elsevier Patient Education  [place].    If you have been instructed to have an in-person evaluation today at a local Urgent Care facility, please use the link below. It will take you to a list of all of our available [facility], including address, phone number and hours of operation. Please do not delay care.  [facility]  If you or a family member do not have a primary care provider, use the link below to schedule a visit and establish care. When you choose a [facility] primary care physician or advanced practice provider, you gain a long-term partner in health. Find a Primary Care Provider  Learn more about [facility]'s in-office and virtual care options: [facility]

## 2022-09-28 ENCOUNTER — Other Ambulatory Visit: Payer: Self-pay | Admitting: Internal Medicine

## 2022-10-08 ENCOUNTER — Other Ambulatory Visit: Payer: Self-pay | Admitting: Internal Medicine

## 2022-10-11 NOTE — Progress Notes (Deleted)
Cardiology Office Note    Date:  10/11/2022   ID:  Christopher Vazquez, DOB [birth year], MRN [id number]  PCP:  Crecencio Mc, MD  Cardiologist:  Kate Sable, MD  Electrophysiologist:  None   Chief Complaint: Follow-up  History of Present Illness:   Christopher Vazquez is a 56 y.o. male with history of HTN, HLD, DM2, hepatic steatosis, and ectatic abdominal aorta who presents for follow-up of ***.  Prior abdominal ultrasound from 2023 showed no evidence of of AAA.  Retroperitoneal ultrasound in 2019 showed a slightly ectatic abdominal aorta with recommendation for follow-up ultrasound in 5 years.  He established care with our office in 07/2019 for ED follow-up of chest pain with unrevealing workup.  At that time, he reported his father had an MI at age 47 and was a smoker.  Subsequent echo in 09/2019 showed an EF of 60 to 65%, no LVH, normal RV systolic function and ventricular cavity size, mildly dilated left atrium, and no significant valvular abnormality.  Aortic root, ascending aorta, and aortic arch were structurally normal without evidence of dilatation or obstruction.  Stress echo in 10/2020 was normal.  He was last seen in the office in 04/2022 and was without symptoms of angina or decompensation.  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   Diabetes mellitus without complication (HCC)    GERD (gastroesophageal reflux disease)    Hyperlipidemia    Hypertension     Past Surgical History:  Procedure Laterality Date   BIOPSY OF SKIN SUBCUTANEOUS TISSUE AND/OR MUCOUS MEMBRANE     nose   CHOLESTEATOMA EXCISION Left 2000   left ear   COLONOSCOPY WITH  PROPOFOL N/A 11/17/2017   Procedure: COLONOSCOPY WITH PROPOFOL;  Surgeon: Lin Landsman, MD;  Location: [facility] ENDOSCOPY;  Service: Gastroenterology;  Laterality: N/A;   ESOPHAGOGASTRODUODENOSCOPY (EGD) WITH PROPOFOL N/A 09/16/2020   Procedure: ESOPHAGOGASTRODUODENOSCOPY (EGD) WITH PROPOFOL;  Surgeon: Lin Landsman, MD;  Location: [facility] ENDOSCOPY;  Service: Gastroenterology;  Laterality: N/A;   HERNIA REPAIR     KNEE ARTHROSCOPY WITH MEDIAL MENISECTOMY Left 07/01/2016   Procedure: left knee arthroscopy with partial medial menisectomy;  Surgeon: Hessie Knows, MD;  Location: [facility] ORS;  Service: Orthopedics;  Laterality: Left;   TONSILLECTOMY AND ADENOIDECTOMY     UMBILICAL HERNIA REPAIR N/A 08/20/2019   Procedure: LAPAROSCOPIC ASSISTED UMBILICAL HERNIA REPAIR WITH MESH;  Surgeon: Johnathan Hausen, MD;  Location: [facility] ORS;  Service: General;  Laterality: N/A;    Current Medications: No outpatient medications have been marked as taking for the 10/22/22 encounter (Appointment) with Rise Mu, PA-C.    Allergies:   Niacin, Shellfish allergy, Scopolamine, and Niacin and related   Social History   Socioeconomic History   Marital status: Married    Spouse name: Not on file   Number of children: 3   Years of education: Not on file   Highest education level: Not on file  Occupational History   Occupation: Supervisor    Employer: [employer]  Tobacco Use   Smoking status: Never   Smokeless tobacco: Never  Vaping Use   Vaping Use: Never used  Substance and Sexual Activity   Alcohol use: Not Currently   Drug use: No   Sexual activity: Yes    Birth control/protection: None    Comment: Married  Other Topics Concern   Not on file  Social History Narrative   Not on file   Social Determinants of Health   Financial Resource Strain: Not on file  Food Insecurity: Not on file  Transportation Needs: Not on file  Physical Activity: Not on file  Stress: Not on file  Social Connections:  Not on file     Family History:  The patient's family history includes AAA (abdominal aortic aneurysm) in his father; Aortic aneurysm (age of onset: 105) in his father; Atrial fibrillation in his son; Diabetes in his mother; Heart disease (age of onset: 45) in his father; Hypertension in his mother; Kidney cancer in his sister; Uterine cancer in his mother.  ROS:   12-point review of systems is negative unless otherwise noted in the HPI.   EKGs/Labs/Other Studies Reviewed:    Studies reviewed were summarized above. The additional studies were reviewed today:  Stress echo 1217/2021: Patient Performance: The patient exercised for 7 minutes and 0 seconds,  achieving 9 METS. The heart rate at peak stress was 162 bpm. The target  heart rate was calculated to be 141 bpm. The percentage of maximum  predicted heart rate achieved was 97.4 %.  The baseline blood pressure was 135/76 mmHg. The blood pressure at peak  stress was 182/56 mmHg. The patient developed shortness of breath and leg  fatigue during the stress exam. The symptoms resolved with rest. Symptoms  resolved quickly with rest.    EKG: Resting EKG showed normal sinus rhythm with no abnormal findings. The  patient developed no abnormal EKG findings during exercise.     2D Echo Findings: The baseline ejection fraction was 60%. The peak  ejection fraction at stress was 80%. Baseline regional wall motion  abnormalities were not present. There were no stress-induced wall motion  abnormalities. This is a negative stress  echocardiogram for ischemia.  ___________  2D echo 09/14/2019: 1. Left ventricular ejection fraction, by visual estimation, is 60 to  65%. The left ventricle has normal function. There is no left ventricular  hypertrophy.   2. Global right ventricle has normal systolic function.The right  ventricular size is normal. No increase in right ventricular wall  thickness.   3. Left atrial size was mildly dilated.   4.  The pulmonic valve was normal in structure. Pulmonic valve  regurgitation is not visualized.   5. TR signal is inadequate for assessing pulmonary artery systolic  pressure.    EKG:  EKG is ordered today.  The EKG ordered today demonstrates ***  Recent Labs: 06/17/2022: ALT 35; BUN 18; Creatinine, Ser 1.32; Potassium 3.9; Sodium 137  Recent Lipid Panel    Component Value Date/Time   CHOL 115 07/29/2022 0928   TRIG 281 (H) 07/29/2022 0928   HDL 27 (L) 07/29/2022 0928   CHOLHDL 4.3 07/29/2022 0928   VLDL 56 (H) 07/29/2022 0928   LDLCALC 32 07/29/2022 0928   LDLCALC 63 12/08/2018 1556   LDLDIRECT 77.0 03/16/2022 1001    PHYSICAL EXAM:    VS:  There were no vitals taken for this visit.  BMI: There is no height or weight on file to calculate BMI.  Physical Exam  Wt Readings from Last 3 Encounters:  06/07/22 226 lb (102.5 kg)  04/22/22 223 lb 9.6 oz (101.4 kg)  03/16/22  220 lb 6.4 oz (100 kg)     ASSESSMENT & PLAN:   HTN: Blood pressure  HLD: LDL 32 in 07/2022 with normal AST/ALT at that time.  Ectatic abdominal aorta: Slightly ectatic abdominal aorta by ultrasound in 2019 with recommendation to follow-up by ultrasound in 2024.   {Are you ordering a CV Procedure (e.g. stress test, cath, DCCV, TEE, etc)?   Press F2        :[id number]}     Disposition: F/u with Dr. Garen Lah or an APP in ***.   Medication Adjustments/Labs and Tests Ordered: Current medicines are reviewed at length with the patient today.  Concerns regarding medicines are outlined above. Medication changes, Labs and Tests ordered today are summarized above and listed in the Patient Instructions accessible in Encounters.   Signed, Christell Faith, PA-C 10/11/2022 9:22 AM     [address], [place] [address] [phone]

## 2022-10-20 ENCOUNTER — Other Ambulatory Visit
Admission: RE | Admit: 2022-10-20 | Discharge: 2022-10-20 | Disposition: A | Discharge status: Home / Self Care | Payer: BC Managed Care – PPO | Source: Ambulatory Visit | Attending: Cardiology | Admitting: Cardiology

## 2022-10-20 DIAGNOSIS — E785 Hyperlipidemia, unspecified: Secondary | ICD-10-CM | POA: Insufficient documentation

## 2022-10-20 LAB — LIPID PANEL
Cholesterol: 113 mg/dL (ref 0–200)
HDL: 22 mg/dL — ABNORMAL LOW (ref >40)
LDL Cholesterol: 52 mg/dL (ref 0–99)
Total CHOL/HDL Ratio: 5.1 RATIO
Triglycerides: 196 mg/dL — ABNORMAL HIGH (ref <150)
VLDL: 39 mg/dL (ref 0–40)

## 2022-10-22 ENCOUNTER — Other Ambulatory Visit: Payer: Self-pay

## 2022-10-22 ENCOUNTER — Telehealth: Payer: Self-pay

## 2022-10-22 ENCOUNTER — Ambulatory Visit: Payer: BC Managed Care – PPO | Admitting: Physician Assistant

## 2022-10-22 DIAGNOSIS — I10 Essential (primary) hypertension: Secondary | ICD-10-CM

## 2022-10-22 DIAGNOSIS — E7800 Pure hypercholesterolemia, unspecified: Secondary | ICD-10-CM

## 2022-10-22 DIAGNOSIS — I77811 Abdominal aortic ectasia: Secondary | ICD-10-CM

## 2022-10-22 NOTE — Telephone Encounter (Signed)
Janan Ridge, [place] 10/22/2022  2:37 PM EST Back to Top    Called Patient. No answer. LMOV   Kate Sable, MD 10/22/2022  1:14 PM EST     Cholesterol improving.  Continue medications as prescribed.  Recheck lipid panel in 2 months.

## 2022-11-02 ENCOUNTER — Other Ambulatory Visit: Payer: Self-pay | Admitting: Internal Medicine

## 2022-11-08 NOTE — Progress Notes (Signed)
Cardiology Office Note:    Date:  11/09/2022   ID:  LIANDRO THELIN, DOB 04/28/1966, MRN [id number]  PCP:  Crecencio Mc, MD   [facility] [place] [facility] Cardiologist:  Kate Sable, MD { Click to update primary MD,subspecialty MD or APP then REFRESH:1}    Referring MD: Crecencio Mc, MD   Chief Complaint  Patient presents with   Follow-up    6 month f/u, no new cardiac concerns     History of Present Illness:    KAIREN HALLINAN is a 57 y.o. male with a hx of HTN, HLD, and DM.   Was last evaluated on 04/22/22 by Dr. Garen Lah, was doing well at that time. BP was well controlled. Cholesterol remained elevated and he was changed to Atorvastatin 40 mg daily. Repeat lipid panel 07/29/22 LDL 32, triglycerides 281. Atorvastatin increased to 80 mg/day.   Today he presents for a routine follow up. He has been doing well. He denies chest pain, palpitations, dyspnea, pnd, orthopnea, n, v, dizziness, syncope, edema, weight gain, or early satiety. He has recently gained a few lbs, transitioned to a new position and is mostly sitting at work. Would like to lose weight and hopefully start working on increasing his activity.    Past Medical History:  Diagnosis Date   Cancer ([place])    basal cell-nose   Complication of anesthesia    with tonsillectomy sever swelling of throat, possible laryngiospasm.   Diabetes mellitus without complication (HCC)    GERD (gastroesophageal reflux disease)    Hyperlipidemia    Hypertension     Past Surgical History:  Procedure Laterality Date   BIOPSY OF SKIN SUBCUTANEOUS TISSUE AND/OR MUCOUS MEMBRANE     nose   CHOLESTEATOMA EXCISION Left 2000   left ear   COLONOSCOPY WITH PROPOFOL N/A 11/17/2017   Procedure: COLONOSCOPY WITH PROPOFOL;  Surgeon: Lin Landsman, MD;  Location: [facility] ENDOSCOPY;  Service: Gastroenterology;  Laterality: N/A;   ESOPHAGOGASTRODUODENOSCOPY (EGD) WITH PROPOFOL N/A 09/16/2020   Procedure:  ESOPHAGOGASTRODUODENOSCOPY (EGD) WITH PROPOFOL;  Surgeon: Lin Landsman, MD;  Location: [facility] ENDOSCOPY;  Service: Gastroenterology;  Laterality: N/A;   HERNIA REPAIR     KNEE ARTHROSCOPY WITH MEDIAL MENISECTOMY Left 07/01/2016   Procedure: left knee arthroscopy with partial medial menisectomy;  Surgeon: Hessie Knows, MD;  Location: [facility] ORS;  Service: Orthopedics;  Laterality: Left;   TONSILLECTOMY AND ADENOIDECTOMY     UMBILICAL HERNIA REPAIR N/A 08/20/2019   Procedure: LAPAROSCOPIC ASSISTED UMBILICAL HERNIA REPAIR WITH MESH;  Surgeon: Johnathan Hausen, MD;  Location: [facility] ORS;  Service: General;  Laterality: N/A;    Current Medications: Current Meds  Medication Sig   ALPRAZolam (XANAX) 0.25 MG tablet Take 1 tablet (0.25 mg total) by mouth at bedtime as needed for anxiety.   aspirin 81 MG tablet Take 81 mg by mouth at bedtime.   atorvastatin (LIPITOR) 80 MG tablet Take 1 tablet (80 mg total) by mouth daily.   fenofibrate 160 MG tablet TAKE 1 TABLET DAILY   metFORMIN (GLUCOPHAGE) 500 MG tablet TAKE 1 TABLET TWICE A DAY  WITH MEALS   montelukast (SINGULAIR) 10 MG tablet TAKE 1 TABLET AT BEDTIME   pantoprazole (PROTONIX) 40 MG tablet Take 1 tablet by mouth once daily   sildenafil (REVATIO) 20 MG tablet TAKE 1 TABLET BY MOUTH THREE TIMES DAILY   telmisartan-hydrochlorothiazide (MICARDIS HCT) 80-25 MG tablet Take 1 tablet by mouth daily.   tiZANidine (ZANAFLEX) 4 MG tablet Take 1 tablet (4 mg  total) by mouth every 8 (eight) hours as needed for muscle spasms.   traZODone (DESYREL) 100 MG tablet TAKE 1 TABLET AT BEDTIME ASNEEDED FOR SLEEP     Allergies:   Niacin, Shellfish allergy, Scopolamine, and Niacin and related   Social History   Socioeconomic History   Marital status: Married    Spouse name: Not on file   Number of children: 3   Years of education: Not on file   Highest education level: Not on file  Occupational History   Occupation: Buyer, retail: [employer]   Tobacco Use   Smoking status: Never   Smokeless tobacco: Never  Vaping Use   Vaping Use: Never used  Substance and Sexual Activity   Alcohol use: Not Currently   Drug use: No   Sexual activity: Yes    Birth control/protection: None    Comment: Married  Other Topics Concern   Not on file  Social History Narrative   Not on file   Social Determinants of Health   Financial Resource Strain: Not on file  Food Insecurity: Not on file  Transportation Needs: Not on file  Physical Activity: Not on file  Stress: Not on file  Social Connections: Not on file     Family History: The patient's family history includes AAA (abdominal aortic aneurysm) in his father; Aortic aneurysm (age of onset: 22) in his father; Atrial fibrillation in his son; Diabetes in his mother; Heart disease (age of onset: 29) in his father; Hypertension in his mother; Kidney cancer in his sister; Uterine cancer in his mother.  ROS:   Review of Systems  Constitutional: Negative.   HENT: Negative.    Eyes: Negative.   Respiratory: Negative.    Cardiovascular:  Negative for chest pain, palpitations, orthopnea, claudication, leg swelling and PND.  Gastrointestinal: Negative.   Genitourinary: Negative.   Musculoskeletal: Negative.   Skin: Negative.   Neurological: Negative.   Endo/Heme/Allergies: Negative.   Psychiatric/Behavioral: Negative.       EKGs/Labs/Other Studies Reviewed:    The following studies were reviewed today:  10/24/20 stress echo - Normal stress echo, no evidence for ischemia.  No arrhythmias noted during exercise.  Normal stress test.  EKG:  EKG is ordered today.  The ekg ordered today demonstrates NSR, 72 bpm.   Recent Labs: 06/17/2022: ALT 35; BUN 18; Creatinine, Ser 1.32; Potassium 3.9; Sodium 137  Recent Lipid Panel    Component Value Date/Time   CHOL 113 10/20/2022 0621   TRIG 196 (H) 10/20/2022 0621   HDL 22 (L) 10/20/2022 0621   CHOLHDL 5.1 10/20/2022 0621   VLDL 39  10/20/2022 0621   LDLCALC 52 10/20/2022 0621   LDLCALC 63 12/08/2018 1556   LDLDIRECT 77.0 03/16/2022 1001     Risk Assessment/Calculations:                Physical Exam:    VS:  BP 134/82 (BP Location: Left Arm, Patient Position: Sitting, Cuff Size: Normal)   Pulse 72   Ht '5\' 8"'$  (1.727 m)   Wt 227 lb 6.4 oz (103.1 kg)   SpO2 97%   BMI 34.58 kg/m     Wt Readings from Last 3 Encounters:  11/09/22 227 lb 6.4 oz (103.1 kg)  06/07/22 226 lb (102.5 kg)  04/22/22 223 lb 9.6 oz (101.4 kg)     GEN:  Well nourished, well developed in no acute distress HEENT: Normal NECK: No JVD; No carotid bruits LYMPHATICS: No lymphadenopathy CARDIAC: RRR,  no murmurs, rubs, gallops RESPIRATORY:  Clear to auscultation without rales, wheezing or rhonchi  ABDOMEN: Soft, non-tender, non-distended MUSCULOSKELETAL:  No edema; No deformity  SKIN: Warm and dry NEUROLOGIC:  Alert and oriented x 3 PSYCHIATRIC:  Normal affect   ASSESSMENT:    1. Pure hypercholesterolemia   2. Essential hypertension   3. BMI 34.0-34.9,adult    PLAN:    In order of problems listed above:  1. Pure hypercholesterolemia- LDL 52, triglycerides 196. Fasting lipid labs already scheduled for 11/21/22 or after. Will follow up with results. Encouraged low saturated fat diet and increase physical activity. If no changes need to be made, ok to have him follow up as needed, per Dr. Garen Lah. 2. HTN - BP 134/82, currently managed. Continue telmisartan-HCTZ 80-25 mg daily.   3. Obesity - BMI 34. He want to make changes to help him lose weight, he is struggling with making lifestyle changes. Discussed taking a few 10 minute walks each day if 30 minutes/day is too daunting.            Medication Adjustments/Labs and Tests Ordered: Current medicines are reviewed at length with the patient today.  Concerns regarding medicines are outlined above.  Orders Placed This Encounter  Procedures   EKG 12-Lead   No orders of the  defined types were placed in this encounter.   Patient Instructions  Medication Instructions:  Your physician recommends that you continue on your current medications as directed. Please refer to the Current Medication list given to you today.  *If you need a refill on your cardiac medications before your next appointment, please call your pharmacy*  Lab Work: No labs ordered today  If you have labs (blood work) drawn today and your tests are completely normal, you will receive your results only by: [place] (if you have MyChart) OR A paper copy in the mail If you have any lab test that is abnormal or we need to change your treatment, we will call you to review the results.   Testing/Procedures: No testing ordered  Follow-Up: At [facility], you and your health needs are our priority.  As part of our continuing mission to provide you with exceptional heart care, we have created designated Provider Care Teams.  These Care Teams include your primary Cardiologist (physician) and Advanced Practice Providers (APPs -  Physician Assistants and Nurse Practitioners) who all work together to provide you with the care you need, when you need it.  We recommend signing up for the patient portal called "MyChart".  Sign up information is provided on this After Visit Summary.  MyChart is used to connect with patients for Virtual Visits (Telemedicine).  Patients are able to view lab/test results, encounter notes, upcoming appointments, etc.  Non-urgent messages can be sent to your provider as well.   To learn more about what you can do with MyChart, go to [email or web address].    Your next appointment:   As needed   The format for your next appointment:   In Person  Provider:   You may see Kate Sable, MD or one of the following Advanced Practice Providers on your designated Care Team:   Murray Hodgkins, NP Christell Faith, PA-C Cadence Kathlen Mody, PA-C Gerrie Nordmann, NP    Important Information About Sugar         Signed, Trudi Ida, NP  11/09/2022 2:12 PM    [place]

## 2022-11-09 ENCOUNTER — Encounter: Payer: Self-pay | Admitting: Physician Assistant

## 2022-11-09 ENCOUNTER — Ambulatory Visit: Payer: BC Managed Care – PPO | Attending: Physician Assistant | Admitting: Cardiology

## 2022-11-09 VITALS — BP 134/82 | HR 72 | Ht 68.0 in | Wt 227.4 lb

## 2022-11-09 DIAGNOSIS — Z6834 Body mass index (BMI) 34.0-34.9, adult: Secondary | ICD-10-CM | POA: Diagnosis not present

## 2022-11-09 DIAGNOSIS — I10 Essential (primary) hypertension: Secondary | ICD-10-CM | POA: Diagnosis not present

## 2022-11-09 DIAGNOSIS — E7800 Pure hypercholesterolemia, unspecified: Secondary | ICD-10-CM | POA: Diagnosis not present

## 2022-11-09 NOTE — Patient Instructions (Signed)
Medication Instructions:  Your physician recommends that you continue on your current medications as directed. Please refer to the Current Medication list given to you today.  *If you need a refill on your cardiac medications before your next appointment, please call your pharmacy*  Lab Work: No labs ordered today  If you have labs (blood work) drawn today and your tests are completely normal, you will receive your results only by: [place] (if you have MyChart) OR A paper copy in the mail If you have any lab test that is abnormal or we need to change your treatment, we will call you to review the results.   Testing/Procedures: No testing ordered  Follow-Up: At [facility], you and your health needs are our priority.  As part of our continuing mission to provide you with exceptional heart care, we have created designated Provider Care Teams.  These Care Teams include your primary Cardiologist (physician) and Advanced Practice Providers (APPs -  Physician Assistants and Nurse Practitioners) who all work together to provide you with the care you need, when you need it.  We recommend signing up for the patient portal called "MyChart".  Sign up information is provided on this After Visit Summary.  MyChart is used to connect with patients for Virtual Visits (Telemedicine).  Patients are able to view lab/test results, encounter notes, upcoming appointments, etc.  Non-urgent messages can be sent to your provider as well.   To learn more about what you can do with MyChart, go to [email or web address].    Your next appointment:   As needed   The format for your next appointment:   In Person  Provider:   You may see Kate Sable, MD or one of the following Advanced Practice Providers on your designated Care Team:   Murray Hodgkins, NP Christell Faith, PA-C Cadence Kathlen Mody, PA-C Gerrie Nordmann, NP   Important Information About Sugar

## 2022-11-14 ENCOUNTER — Other Ambulatory Visit: Payer: Self-pay | Admitting: Internal Medicine

## 2022-11-27 ENCOUNTER — Other Ambulatory Visit: Payer: Self-pay | Admitting: Internal Medicine

## 2022-12-08 ENCOUNTER — Other Ambulatory Visit: Payer: Self-pay | Admitting: Internal Medicine

## 2022-12-08 ENCOUNTER — Encounter: Payer: Self-pay | Admitting: Internal Medicine

## 2022-12-08 ENCOUNTER — Ambulatory Visit (INDEPENDENT_AMBULATORY_CARE_PROVIDER_SITE_OTHER): Payer: BC Managed Care – PPO | Admitting: Internal Medicine

## 2022-12-08 VITALS — BP 108/64 | HR 70 | Temp 97.5°F | Ht 68.0 in | Wt 224.6 lb

## 2022-12-08 DIAGNOSIS — E669 Obesity, unspecified: Secondary | ICD-10-CM

## 2022-12-08 DIAGNOSIS — E1169 Type 2 diabetes mellitus with other specified complication: Secondary | ICD-10-CM | POA: Diagnosis not present

## 2022-12-08 DIAGNOSIS — B351 Tinea unguium: Secondary | ICD-10-CM

## 2022-12-08 DIAGNOSIS — Z125 Encounter for screening for malignant neoplasm of prostate: Secondary | ICD-10-CM

## 2022-12-08 DIAGNOSIS — K760 Fatty (change of) liver, not elsewhere classified: Secondary | ICD-10-CM

## 2022-12-08 DIAGNOSIS — Z79899 Other long term (current) drug therapy: Secondary | ICD-10-CM | POA: Diagnosis not present

## 2022-12-08 DIAGNOSIS — E1159 Type 2 diabetes mellitus with other circulatory complications: Secondary | ICD-10-CM | POA: Diagnosis not present

## 2022-12-08 DIAGNOSIS — I152 Hypertension secondary to endocrine disorders: Secondary | ICD-10-CM

## 2022-12-08 DIAGNOSIS — Z8249 Family history of ischemic heart disease and other diseases of the circulatory system: Secondary | ICD-10-CM

## 2022-12-08 DIAGNOSIS — I10 Essential (primary) hypertension: Secondary | ICD-10-CM

## 2022-12-08 DIAGNOSIS — E782 Mixed hyperlipidemia: Secondary | ICD-10-CM | POA: Diagnosis not present

## 2022-12-08 DIAGNOSIS — I77811 Abdominal aortic ectasia: Secondary | ICD-10-CM

## 2022-12-08 LAB — CBC WITH DIFFERENTIAL/PLATELET
Basophils Absolute: 0 10*3/uL (ref 0.0–0.1)
Basophils Relative: 0.6 % (ref 0.0–3.0)
Eosinophils Absolute: 0.2 10*3/uL (ref 0.0–0.7)
Eosinophils Relative: 3.4 % (ref 0.0–5.0)
HCT: 42.1 % (ref 39.0–52.0)
Hemoglobin: 14.7 g/dL (ref 13.0–17.0)
Lymphocytes Relative: 39.6 % (ref 12.0–46.0)
Lymphs Abs: 2 10*3/uL (ref 0.7–4.0)
MCHC: 35 g/dL (ref 30.0–36.0)
MCV: 88.6 fl (ref 78.0–100.0)
Monocytes Absolute: 0.5 10*3/uL (ref 0.1–1.0)
Monocytes Relative: 9.3 % (ref 3.0–12.0)
Neutro Abs: 2.4 10*3/uL (ref 1.4–7.7)
Neutrophils Relative %: 47.1 % (ref 43.0–77.0)
Platelets: 236 10*3/uL (ref 150.0–400.0)
RBC: 4.75 Mil/uL (ref 4.22–5.81)
RDW: 14.2 % (ref 11.5–15.5)
WBC: 5.1 10*3/uL (ref 4.0–10.5)

## 2022-12-08 LAB — COMPREHENSIVE METABOLIC PANEL
ALT: 42 U/L (ref 0–53)
AST: 30 U/L (ref 0–37)
Albumin: 4.5 g/dL (ref 3.5–5.2)
Alkaline Phosphatase: 43 U/L (ref 39–117)
BUN: 18 mg/dL (ref 6–23)
CO2: 28 mEq/L (ref 19–32)
Calcium: 9.3 mg/dL (ref 8.4–10.5)
Chloride: 103 mEq/L (ref 96–112)
Creatinine, Ser: 1.41 mg/dL (ref 0.40–1.50)
GFR: 56.02 mL/min — ABNORMAL LOW (ref >60.00)
Glucose, Bld: 107 mg/dL — ABNORMAL HIGH (ref 70–99)
Potassium: 4.3 mEq/L (ref 3.5–5.1)
Sodium: 140 mEq/L (ref 135–145)
Total Bilirubin: 0.4 mg/dL (ref 0.2–1.2)
Total Protein: 7 g/dL (ref 6.0–8.3)

## 2022-12-08 LAB — LIPID PANEL
Cholesterol: 113 mg/dL (ref 0–200)
HDL: 26.4 mg/dL — ABNORMAL LOW (ref >39.00)
LDL Cholesterol: 52 mg/dL (ref 0–99)
NonHDL: 86.6
Total CHOL/HDL Ratio: 4
Triglycerides: 173 mg/dL — ABNORMAL HIGH (ref 0.0–149.0)
VLDL: 34.6 mg/dL (ref 0.0–40.0)

## 2022-12-08 LAB — LDL CHOLESTEROL, DIRECT: Direct LDL: 70 mg/dL

## 2022-12-08 LAB — HEMOGLOBIN A1C: Hgb A1c MFr Bld: 6.7 % — ABNORMAL HIGH (ref 4.6–6.5)

## 2022-12-08 LAB — PSA: PSA: 0.68 ng/mL (ref 0.10–4.00)

## 2022-12-08 LAB — TSH: TSH: 2.27 u[IU]/mL (ref 0.35–5.50)

## 2022-12-08 MED ORDER — TRAZODONE HCL 100 MG PO TABS: ORAL_TABLET | ORAL | 0 refills | Status: DC

## 2022-12-08 MED ORDER — TIRZEPATIDE 2.5 MG/0.5ML ~~LOC~~ SOAJ: 2.5000 mg | SUBCUTANEOUS | 3 refills | Status: DC

## 2022-12-08 MED ORDER — ATORVASTATIN CALCIUM 80 MG PO TABS: 80.0000 mg | ORAL_TABLET | Freq: Every day | ORAL | 1 refills | Status: DC

## 2022-12-08 MED ORDER — CICLOPIROX 8 % EX SOLN: Freq: Every day | CUTANEOUS | 0 refills | Status: DC

## 2022-12-08 NOTE — Progress Notes (Unsigned)
Subjective:  Patient ID: Christopher Vazquez, male    DOB: 01/12/1966  Age: 57 y.o. MRN: [id number]  CC: The primary encounter diagnosis was Obesity, diabetes, and hypertension syndrome ([place]). Diagnoses of Mixed hyperlipidemia, Prostate cancer screening, and Long-term use of high-risk medication were also pertinent to this visit.   HPI Christopher Vazquez presents for  Chief Complaint  Patient presents with   Medical Management of Chronic Issues    6 month follow up on diabetes, hypertension   1) HLD:  atorvastatin increased to 80 mg recently by cardiology   2) fastings 130 to 140  and PP 170 to 190.   On metformin  500 mg bid.  Higher doses of metformin were not tolerated due to persistent diarrehea.   No rrcaiers or C/I to GLP 1  Outpatient Medications Prior to Visit  Medication Sig Dispense Refill   ALPRAZolam (XANAX) 0.25 MG tablet Take 1 tablet (0.25 mg total) by mouth at bedtime as needed for anxiety. 30 tablet 0   aspirin 81 MG tablet Take 81 mg by mouth at bedtime.     atorvastatin (LIPITOR) 80 MG tablet Take 1 tablet (80 mg total) by mouth daily. 90 tablet 1   fenofibrate 160 MG tablet TAKE 1 TABLET DAILY 90 tablet 3   metFORMIN (GLUCOPHAGE) 500 MG tablet TAKE 1 TABLET TWICE A DAY  WITH MEALS 180 tablet 3   montelukast (SINGULAIR) 10 MG tablet TAKE 1 TABLET AT BEDTIME 90 tablet 3   pantoprazole (PROTONIX) 40 MG tablet Take 1 tablet by mouth once daily 90 tablet 3   sildenafil (REVATIO) 20 MG tablet TAKE 1 TABLET BY MOUTH THREE TIMES DAILY 90 tablet 0   telmisartan-hydrochlorothiazide (MICARDIS HCT) 80-25 MG tablet Take 1 tablet by mouth daily. 90 tablet 3   traZODone (DESYREL) 100 MG tablet TAKE 1 TABLET AT BEDTIME ASNEEDED FOR SLEEP 90 tablet 0   tiZANidine (ZANAFLEX) 4 MG tablet Take 1 tablet (4 mg total) by mouth every 8 (eight) hours as needed for muscle spasms. (Patient not taking: Reported on 12/08/2022) 60 tablet 1   No facility-administered medications prior to visit.     Review of Systems;  Patient denies headache, fevers, malaise, unintentional weight loss, skin rash, eye pain, sinus congestion and sinus pain, sore throat, dysphagia,  hemoptysis , cough, dyspnea, wheezing, chest pain, palpitations, orthopnea, edema, abdominal pain, nausea, melena, diarrhea, constipation, flank pain, dysuria, hematuria, urinary  Frequency, nocturia, numbness, tingling, seizures,  Focal weakness, Loss of consciousness,  Tremor, insomnia, depression, anxiety, and suicidal ideation.      Objective:  BP 108/64   Pulse 70   Temp (!) 97.5 F (36.4 C) (Oral)   Ht '5\' 8"'$  (1.727 m)   Wt 224 lb 9.6 oz (101.9 kg)   SpO2 97%   BMI 34.15 kg/m   BP Readings from Last 3 Encounters:  12/08/22 108/64  11/09/22 134/82  06/07/22 118/70    Wt Readings from Last 3 Encounters:  12/08/22 224 lb 9.6 oz (101.9 kg)  11/09/22 227 lb 6.4 oz (103.1 kg)  06/07/22 226 lb (102.5 kg)    Physical Exam  Lab Results  Component Value Date   HGBA1C 6.4 06/17/2022   HGBA1C 6.1 03/16/2022   HGBA1C 5.9 08/31/2021    Lab Results  Component Value Date   CREATININE 1.32 06/17/2022   CREATININE 1.35 03/16/2022   CREATININE 1.34 08/31/2021    Lab Results  Component Value Date   WBC 5.8 05/26/2020   HGB 15.0  05/26/2020   HCT 42 05/26/2020   PLT 246 05/26/2020   GLUCOSE 115 (H) 06/17/2022   CHOL 113 10/20/2022   TRIG 196 (H) 10/20/2022   HDL 22 (L) 10/20/2022   LDLDIRECT 77.0 03/16/2022   LDLCALC 52 10/20/2022   ALT 35 06/17/2022   AST 25 06/17/2022   NA 137 06/17/2022   K 3.9 06/17/2022   CL 105 06/17/2022   CREATININE 1.32 06/17/2022   BUN 18 06/17/2022   CO2 25 06/17/2022   TSH 3.30 05/26/2020   PSA 0.57 08/31/2021   HGBA1C 6.4 06/17/2022   MICROALBUR <0.7 03/16/2022    No results found.  Assessment & Plan:  .Obesity, diabetes, and hypertension syndrome ([place])  Mixed hyperlipidemia  Prostate cancer screening  Long-term use of high-risk medication     I  provided 30 minutes of face-to-face time during this encounter reviewing patient's last visit with me, patient's  most recent visit with cardiology,  nephrology,  and neurology,  recent surgical and non surgical procedures, previous  labs and imaging studies, counseling on currently addressed issues,  and post visit ordering to diagnostics and therapeutics .   Follow-up: No follow-ups on file.   Crecencio Mc, MD

## 2022-12-08 NOTE — Patient Instructions (Addendum)
I am prescribing a medication to help you control your diabetes and  lose weight called Mounjaro. It will require prior authorization which may take a week or more.  Continue metformin  for now,  but you can stop metformin if the combination causes nausea.   Darcel Bayley is a GLP-1 receptor agonist.  It is a medication that is taken as a weekly subcutaneous injection. It is NOT insulin.  It  causes your pancreas to increase its  own insulin secretion  And also slows down the emptying of your stomach,  So it decreases your appetite and helps you lose weight. For people with diabetes,  the decreased appetite results in lower blood sugars.   The dose for the first 4 weekly doses is 2.5 mg .  You may have mild nausea on the first or second day but this should resolve.  If not  ,  stop the medication.   As long as you are losing weight,  you can continue the dose you are on .   FOR YOUR REFILLS,  SEND ME A MYCHART MESSAGE WITH THE FOLLOWING INFORMATION:  1)  the dose you are currently taking (2.5 mg for example)  2) whether or not you have lost weight in the preceding week (has your weight plateaued?)  I recommend that you  increase the dose every  4 weeks ONLY if your weight has plateaued.

## 2022-12-09 ENCOUNTER — Other Ambulatory Visit: Payer: Self-pay

## 2022-12-09 DIAGNOSIS — B351 Tinea unguium: Secondary | ICD-10-CM | POA: Insufficient documentation

## 2022-12-09 NOTE — Assessment & Plan Note (Signed)
Improved control since  Starting metformin 500 mg bid .  he has had difficulty losing weight due to increased appetite and lack of exercise. Trial of Ozempic   Lab Results  Component Value Date   HGBA1C 6.7 (H) 12/08/2022   Lab Results  Component Value Date   MICROALBUR <0.7 03/16/2022   MICROALBUR 1.6 03/06/2020

## 2022-12-09 NOTE — Assessment & Plan Note (Signed)
Prescribing ciclopirox topical treatment

## 2022-12-09 NOTE — Assessment & Plan Note (Signed)
Well controlled on current regimen of telmisartan/cht 80/25 mg once daily . Renal function stable, no changes today  Lab Results  Component Value Date   CREATININE 1.41 12/08/2022

## 2022-12-09 NOTE — Assessment & Plan Note (Signed)
LDL and triglycerides are at goal on fenofibrate and simvastatin. He has no side effects and liver enzymes are normal. No changes today   Lab Results  Component Value Date   CHOL 113 12/08/2022   HDL 26.40 (L) 12/08/2022   LDLCALC 52 12/08/2022   LDLDIRECT 70.0 12/08/2022   TRIG 173.0 (H) 12/08/2022   CHOLHDL 4 12/08/2022   Lab Results  Component Value Date   ALT 42 12/08/2022   AST 30 12/08/2022   ALKPHOS 43 12/08/2022   BILITOT 0.4 12/08/2022

## 2022-12-17 ENCOUNTER — Ambulatory Visit
Admission: RE | Admit: 2022-12-17 | Discharge: 2022-12-17 | Disposition: A | Discharge status: Home / Self Care | Payer: BC Managed Care – PPO | Source: Ambulatory Visit | Attending: Internal Medicine | Admitting: Internal Medicine

## 2022-12-17 DIAGNOSIS — Z8249 Family history of ischemic heart disease and other diseases of the circulatory system: Secondary | ICD-10-CM | POA: Diagnosis not present

## 2022-12-17 DIAGNOSIS — K760 Fatty (change of) liver, not elsewhere classified: Secondary | ICD-10-CM

## 2022-12-17 DIAGNOSIS — I77811 Abdominal aortic ectasia: Secondary | ICD-10-CM | POA: Diagnosis not present

## 2022-12-17 DIAGNOSIS — Z0389 Encounter for observation for other suspected diseases and conditions ruled out: Secondary | ICD-10-CM | POA: Diagnosis not present

## 2022-12-31 ENCOUNTER — Encounter: Payer: Self-pay | Admitting: Internal Medicine

## 2023-01-05 ENCOUNTER — Encounter: Payer: Self-pay | Admitting: Internal Medicine

## 2023-01-05 ENCOUNTER — Other Ambulatory Visit: Payer: Self-pay

## 2023-01-05 MED ORDER — CICLOPIROX 8 % EX SOLN: Freq: Every day | CUTANEOUS | 0 refills | Status: DC

## 2023-01-05 NOTE — Telephone Encounter (Signed)
Refilled: 12/08/2022 Last OV: 12/08/2022 Next OV: not scheduled

## 2023-01-29 ENCOUNTER — Other Ambulatory Visit: Payer: Self-pay | Admitting: Family

## 2023-01-29 MED ORDER — TIRZEPATIDE 5 MG/0.5ML ~~LOC~~ SOAJ: 5.0000 mg | SUBCUTANEOUS | 1 refills | Status: DC

## 2023-02-08 ENCOUNTER — Other Ambulatory Visit: Payer: Self-pay | Admitting: Internal Medicine

## 2023-02-27 ENCOUNTER — Encounter: Payer: Self-pay | Admitting: Internal Medicine

## 2023-02-27 DIAGNOSIS — I152 Hypertension secondary to endocrine disorders: Secondary | ICD-10-CM

## 2023-02-27 DIAGNOSIS — K760 Fatty (change of) liver, not elsewhere classified: Secondary | ICD-10-CM

## 2023-02-27 DIAGNOSIS — E782 Mixed hyperlipidemia: Secondary | ICD-10-CM

## 2023-02-27 NOTE — Telephone Encounter (Signed)
Not sure what labs you would want ordered.  If you are okay with placing future orders, we can schedule whenever you would like.   LOV; 12/08/22 (included labs)  NOV: not scheduled

## 2023-03-15 ENCOUNTER — Other Ambulatory Visit (INDEPENDENT_AMBULATORY_CARE_PROVIDER_SITE_OTHER): Payer: BC Managed Care – PPO

## 2023-03-15 DIAGNOSIS — E782 Mixed hyperlipidemia: Secondary | ICD-10-CM

## 2023-03-15 DIAGNOSIS — E1169 Type 2 diabetes mellitus with other specified complication: Secondary | ICD-10-CM | POA: Diagnosis not present

## 2023-03-15 DIAGNOSIS — K760 Fatty (change of) liver, not elsewhere classified: Secondary | ICD-10-CM

## 2023-03-15 DIAGNOSIS — E669 Obesity, unspecified: Secondary | ICD-10-CM

## 2023-03-15 DIAGNOSIS — I152 Hypertension secondary to endocrine disorders: Secondary | ICD-10-CM

## 2023-03-15 DIAGNOSIS — E1159 Type 2 diabetes mellitus with other circulatory complications: Secondary | ICD-10-CM

## 2023-03-15 LAB — LIPID PANEL
Cholesterol: 93 mg/dL (ref 0–200)
HDL: 28 mg/dL — ABNORMAL LOW (ref >39.00)
LDL Cholesterol: 41 mg/dL (ref 0–99)
NonHDL: 64.76
Total CHOL/HDL Ratio: 3
Triglycerides: 117 mg/dL (ref 0.0–149.0)
VLDL: 23.4 mg/dL (ref 0.0–40.0)

## 2023-03-15 LAB — HEMOGLOBIN A1C: Hgb A1c MFr Bld: 5.8 % (ref 4.6–6.5)

## 2023-03-15 LAB — MICROALBUMIN / CREATININE URINE RATIO
Creatinine,U: 267 mg/dL
Microalb Creat Ratio: 0.8 mg/g (ref 0.0–30.0)
Microalb, Ur: 2.1 mg/dL — ABNORMAL HIGH (ref 0.0–1.9)

## 2023-03-15 LAB — COMPREHENSIVE METABOLIC PANEL
ALT: 40 U/L (ref 0–53)
AST: 41 U/L — ABNORMAL HIGH (ref 0–37)
Albumin: 4.4 g/dL (ref 3.5–5.2)
Alkaline Phosphatase: 39 U/L (ref 39–117)
BUN: 24 mg/dL — ABNORMAL HIGH (ref 6–23)
CO2: 28 mEq/L (ref 19–32)
Calcium: 10 mg/dL (ref 8.4–10.5)
Chloride: 101 mEq/L (ref 96–112)
Creatinine, Ser: 1.39 mg/dL (ref 0.40–1.50)
GFR: 56.88 mL/min — ABNORMAL LOW (ref >60.00)
Glucose, Bld: 88 mg/dL (ref 70–99)
Potassium: 4.3 mEq/L (ref 3.5–5.1)
Sodium: 140 mEq/L (ref 135–145)
Total Bilirubin: 0.5 mg/dL (ref 0.2–1.2)
Total Protein: 7 g/dL (ref 6.0–8.3)

## 2023-03-15 LAB — LDL CHOLESTEROL, DIRECT: Direct LDL: 54 mg/dL

## 2023-03-21 ENCOUNTER — Ambulatory Visit (INDEPENDENT_AMBULATORY_CARE_PROVIDER_SITE_OTHER): Payer: BC Managed Care – PPO | Admitting: Internal Medicine

## 2023-03-21 VITALS — BP 112/70 | HR 83 | Temp 98.0°F | Ht 68.0 in | Wt 183.8 lb

## 2023-03-21 DIAGNOSIS — I10 Essential (primary) hypertension: Secondary | ICD-10-CM | POA: Diagnosis not present

## 2023-03-21 DIAGNOSIS — Z7985 Long-term (current) use of injectable non-insulin antidiabetic drugs: Secondary | ICD-10-CM | POA: Diagnosis not present

## 2023-03-21 MED ORDER — TIRZEPATIDE 7.5 MG/0.5ML ~~LOC~~ SOAJ: 7.5000 mg | SUBCUTANEOUS | 3 refills | Status: DC

## 2023-03-21 MED ORDER — MONTELUKAST SODIUM 10 MG PO TABS: ORAL_TABLET | ORAL | 3 refills | Status: DC

## 2023-03-21 NOTE — Assessment & Plan Note (Addendum)
He has reduced A1c to 5.8 and has dropped 40 lbs using mounjaro.  His weight has plateaued.  Increase dose to 1.7 mg

## 2023-03-21 NOTE — Patient Instructions (Addendum)
Congratulations!     Your blood pressure has been lowered by your significant weight loss,  so Reduce telmisartan to 1/2 tablet daily  of the current supply    However,  DO NOT REFILL ,   we will dc the hctz when you are due for refill and either continue 40 mg telmisartan or increase back to 80 mg to address the protein in your urine (depending on your home readings).  Send me a mychart  message of BP readings  when you are close to being out of current supply   Goal blood pressure should be 110/70  or above,  but below 130/80  We will increase the mounjaro dose with next refill  to 7.5 mg weekly

## 2023-03-21 NOTE — Assessment & Plan Note (Signed)
Reducing telmisartan/hct by 50% .  He has  new onset microalbuminuria , may need to dc htz and continue full dose of telmisartan

## 2023-03-21 NOTE — Progress Notes (Signed)
Subjective:  Patient ID: Christopher Vazquez, male    DOB: 1965/11/22  Age: 57 y.o. MRN: [id number]  CC: The primary encounter diagnosis was Long-term current use of injectable noninsulin antidiabetic medication. A diagnosis of Essential hypertension was also pertinent to this visit.   HPI Christopher Vazquez presents for  Chief Complaint  Patient presents with   Medical Management of Chronic Issues   1) Type 2 DM /obesity : using Mounjaro.  Initial nausea has resolved  with dietary changfes ( no fried foods) and appetite is suppressed.  Has been on a plateau of 173 lbs at home for the last 2 weeks.  Taking 5 mg  on the 2nd month.  Exercising 3 days per week 15 minutes (walking and attempting to jog)   40 lbs thus far,  his goal is 165 lbs.  A1c is now 5.8  feels better than he has in years.  Running up the stairs in years  taking metformin and fiber gummies for regularity    2) HTN:  taking telmisartan hctz ,  feeling light headed  , recent home readings have been as low as  96/52     3) HLD:  taking lipitor and fenofibrate.  Trigs now 117   stopping fenofibrate.    Outpatient Medications Prior to Visit  Medication Sig Dispense Refill   ALPRAZolam (XANAX) 0.25 MG tablet Take 1 tablet (0.25 mg total) by mouth at bedtime as needed for anxiety. 30 tablet 0   aspirin 81 MG tablet Take 81 mg by mouth at bedtime.     atorvastatin (LIPITOR) 80 MG tablet Take 1 tablet (80 mg total) by mouth daily. 90 tablet 1   metFORMIN (GLUCOPHAGE) 500 MG tablet TAKE 1 TABLET TWICE A DAY  WITH MEALS 180 tablet 3   pantoprazole (PROTONIX) 40 MG tablet Take 1 tablet by mouth once daily 90 tablet 3   sildenafil (REVATIO) 20 MG tablet TAKE 1 TABLET BY MOUTH THREE TIMES DAILY 90 tablet 0   telmisartan-hydrochlorothiazide (MICARDIS HCT) 80-25 MG tablet Take 1 tablet by mouth daily. 90 tablet 3   traZODone (DESYREL) 100 MG tablet TAKE 1 TABLET AT BEDTIME ASNEEDED FOR SLEEP 270 tablet 0   fenofibrate 160 MG tablet TAKE  1 TABLET DAILY 90 tablet 3   montelukast (SINGULAIR) 10 MG tablet TAKE 1 TABLET AT BEDTIME 90 tablet 3   tirzepatide (MOUNJARO) 5 MG/0.5ML Pen Inject 5 mg into the skin once a week. 6 mL 1   ciclopirox (PENLAC) 8 % solution Apply topically at bedtime. Apply over nail and surrounding skin. Apply daily over previous coat. After seven (7) days, may remove with alcohol and continue cycle. (Patient not taking: Reported on 03/21/2023) 6.6 mL 0   No facility-administered medications prior to visit.    Review of Systems;  Patient denies headache, fevers, malaise, unintentional weight loss, skin rash, eye pain, sinus congestion and sinus pain, sore throat, dysphagia,  hemoptysis , cough, dyspnea, wheezing, chest pain, palpitations, orthopnea, edema, abdominal pain, nausea, melena, diarrhea, constipation, flank pain, dysuria, hematuria, urinary  Frequency, nocturia, numbness, tingling, seizures,  Focal weakness, Loss of consciousness,  Tremor, insomnia, depression, anxiety, and suicidal ideation.      Objective:  BP 112/70   Pulse 83   Temp 98 F (36.7 C) (Oral)   Ht 5\' 8"  (1.727 m)   Wt 183 lb 12.8 oz (83.4 kg)   SpO2 97%   BMI 27.95 kg/m   BP Readings from Last 3 Encounters:  03/21/23 112/70  12/08/22 108/64  11/09/22 134/82    Wt Readings from Last 3 Encounters:  03/21/23 183 lb 12.8 oz (83.4 kg)  12/08/22 224 lb 9.6 oz (101.9 kg)  11/09/22 227 lb 6.4 oz (103.1 kg)    Physical Exam Vitals reviewed.  Constitutional:      General: He is not in acute distress.    Appearance: Normal appearance. He is normal weight. He is not ill-appearing, toxic-appearing or diaphoretic.  HENT:     Head: Normocephalic.  Eyes:     General: No scleral icterus.       Right eye: No discharge.        Left eye: No discharge.     Conjunctiva/sclera: Conjunctivae normal.  Cardiovascular:     Rate and Rhythm: Normal rate and regular rhythm.     Heart sounds: Normal heart sounds.  Pulmonary:      Effort: Pulmonary effort is normal. No respiratory distress.     Breath sounds: Normal breath sounds.  Musculoskeletal:        General: Normal range of motion.     Cervical back: Normal range of motion.  Skin:    General: Skin is warm and dry.  Neurological:     General: No focal deficit present.     Mental Status: He is alert and oriented to person, place, and time. Mental status is at baseline.  Psychiatric:        Mood and Affect: Mood normal.        Behavior: Behavior normal.        Thought Content: Thought content normal.        Judgment: Judgment normal.    Lab Results  Component Value Date   HGBA1C 5.8 03/15/2023   HGBA1C 6.7 (H) 12/08/2022   HGBA1C 6.4 06/17/2022    Lab Results  Component Value Date   CREATININE 1.39 03/15/2023   CREATININE 1.41 12/08/2022   CREATININE 1.32 06/17/2022    Lab Results  Component Value Date   WBC 5.1 12/08/2022   HGB 14.7 12/08/2022   HCT 42.1 12/08/2022   PLT 236.0 12/08/2022   GLUCOSE 88 03/15/2023   CHOL 93 03/15/2023   TRIG 117.0 03/15/2023   HDL 28.00 (L) 03/15/2023   LDLDIRECT 54.0 03/15/2023   LDLCALC 41 03/15/2023   ALT 40 03/15/2023   AST 41 (H) 03/15/2023   NA 140 03/15/2023   K 4.3 03/15/2023   CL 101 03/15/2023   CREATININE 1.39 03/15/2023   BUN 24 (H) 03/15/2023   CO2 28 03/15/2023   TSH 2.27 12/08/2022   PSA 0.68 12/08/2022   HGBA1C 5.8 03/15/2023   MICROALBUR 2.1 (H) 03/15/2023    US AORTA DUPLEX LIMITED  Result Date: 12/18/2022 CLINICAL DATA:  History of ectatic aorta, 5 year follow-up examination advised. Per technologist note, very limited exam due to excessive bowel gas and dense liver. EXAM: ULTRASOUND OF ABDOMINAL AORTA TECHNIQUE: Ultrasound examination of the abdominal aorta and proximal common iliac arteries was performed to evaluate for aneurysm. Additional color and Doppler images of the distal aorta were obtained to document patency. COMPARISON:  Retroperitoneal ultrasound November 09, 2017  FINDINGS: Abdominal aortic measurements as follows: Proximal:  2.2 x 2.4 cm Mid:  1.9 x 1.8 cm Distal:  1.6 x 2.0 cm Patent: Yes, peak systolic velocity is 107 cm/s Right common iliac artery: 0.8 x 1.2 cm Left common iliac artery: 0.9 x 1.0 cm IMPRESSION: No evidence of abdominal aortic aneurysm. Electronically Signed   By: Livia Snellen  Konanur M.D.   On: 12/18/2022 15:17   US Abdomen Limited RUQ (LIVER/GB)  Result Date: 12/17/2022 CLINICAL DATA:  Fatty liver EXAM: ULTRASOUND ABDOMEN LIMITED RIGHT UPPER QUADRANT COMPARISON:  CT abdomen pelvis 05/16/2012 FINDINGS: Gallbladder: No gallstones or wall thickening visualized. No sonographic Murphy sign noted by sonographer. Common bile duct: Diameter: 3.1 mm Liver: Increased echogenicity. No focal lesion. Portal vein is patent on color Doppler imaging with normal direction of blood flow towards the liver. Other: None. IMPRESSION: 1. Increased hepatic parenchymal echogenicity suggestive of steatosis. 2. No cholelithiasis or sonographic evidence for acute cholecystitis. Electronically Signed   By: Annia Belt M.D.   On: 12/17/2022 10:33    Assessment & Plan:  .Long-term current use of injectable noninsulin antidiabetic medication Assessment & Plan: He has reduced A1c to 5.8 and has dropped 40 lbs using mounjaro.  His weight has plateaued.  Increase dose to 1.7 mg    Essential hypertension Assessment & Plan: Reducing telmisartan/hct by 50% .  He has  new onset microalbuminuria , may need to dc htz and continue full dose of telmisartan    Other orders -     Montelukast Sodium; TAKE 1 TABLET AT BEDTIME  Dispense: 90 tablet; Refill: 3 -     Tirzepatide; Inject 7.5 mg into the skin once a week.  Dispense: 2 mL; Refill: 3    Follow-up: No follow-ups on file.   Sherlene Shams, MD

## 2023-04-06 ENCOUNTER — Other Ambulatory Visit: Payer: Self-pay | Admitting: Internal Medicine

## 2023-04-09 ENCOUNTER — Encounter: Payer: Self-pay | Admitting: Internal Medicine

## 2023-04-09 ENCOUNTER — Other Ambulatory Visit: Payer: Self-pay | Admitting: Internal Medicine

## 2023-04-11 ENCOUNTER — Other Ambulatory Visit: Payer: Self-pay

## 2023-04-11 ENCOUNTER — Other Ambulatory Visit: Payer: Self-pay | Admitting: Internal Medicine

## 2023-04-11 MED ORDER — ATORVASTATIN CALCIUM 80 MG PO TABS: 80.0000 mg | ORAL_TABLET | Freq: Every day | ORAL | 1 refills | Status: DC

## 2023-04-11 MED ORDER — TELMISARTAN-HCTZ 40-12.5 MG PO TABS: 1.0000 | ORAL_TABLET | Freq: Every day | ORAL | 0 refills | Status: DC

## 2023-04-19 ENCOUNTER — Other Ambulatory Visit: Payer: Self-pay

## 2023-04-19 MED ORDER — TELMISARTAN-HCTZ 40-12.5 MG PO TABS: 1.0000 | ORAL_TABLET | Freq: Every day | ORAL | 1 refills | Status: DC

## 2023-05-03 DIAGNOSIS — H93292 Other abnormal auditory perceptions, left ear: Secondary | ICD-10-CM | POA: Diagnosis not present

## 2023-05-03 DIAGNOSIS — Z8669 Personal history of other diseases of the nervous system and sense organs: Secondary | ICD-10-CM | POA: Diagnosis not present

## 2023-05-03 DIAGNOSIS — H6992 Unspecified Eustachian tube disorder, left ear: Secondary | ICD-10-CM | POA: Diagnosis not present

## 2023-05-05 ENCOUNTER — Ambulatory Visit
Admission: RE | Admit: 2023-05-05 | Discharge: 2023-05-05 | Disposition: A | Discharge status: Home / Self Care | Payer: BC Managed Care – PPO | Source: Ambulatory Visit | Attending: Urgent Care | Admitting: Urgent Care

## 2023-05-05 DIAGNOSIS — J019 Acute sinusitis, unspecified: Secondary | ICD-10-CM | POA: Diagnosis not present

## 2023-05-05 DIAGNOSIS — B9689 Other specified bacterial agents as the cause of diseases classified elsewhere: Secondary | ICD-10-CM

## 2023-05-05 MED ORDER — AMOXICILLIN-POT CLAVULANATE 875-125 MG PO TABS: 1.0000 | ORAL_TABLET | Freq: Two times a day (BID) | ORAL | 0 refills | Status: DC

## 2023-05-05 MED ORDER — PREDNISONE 20 MG PO TABS: ORAL_TABLET | ORAL | 0 refills | Status: AC

## 2023-05-05 NOTE — ED Provider Notes (Signed)
[name]    CSN: [id number] Arrival date & time: 05/05/23  4098      History   Chief Complaint Chief Complaint  Patient presents with   Sore Throat    Cough and sore throat. - Entered by patient    HPI JEHIEL KOEPP is a 57 y.o. male.    Sore Throat    Presents to urgent care with complaint of sore throat, cough, sinus congestion times 1-1/2 x 2 weeks.  He endorses nasal congestion with production of colored mucus.  Patient states he was seen by an ENT doctor because of complaint of ear fullness.  He has history of cholesteatoma.  ENT recommend treatment with Flonase and Claritin.  Presents with continued feeling of ear fullness, especially left-sided.  Now development of productive cough.  Past Medical History:  Diagnosis Date   Cancer (HCC)    basal cell-nose   Complication of anesthesia    with tonsillectomy sever swelling of throat, possible laryngiospasm.   Diabetes mellitus without complication (HCC)    GERD (gastroesophageal reflux disease)    Hyperlipidemia    Hypertension     Patient Active Problem List   Diagnosis Date Noted   Long-term current use of injectable noninsulin antidiabetic medication 03/21/2023   Onychomycosis 12/09/2022   Strain of latissimus dorsi muscle 06/07/2022   Grief 03/16/2022   Insomnia 09/08/2021   Exertional dyspnea 11/02/2020   Esophageal dysphagia    Disorder of bursae of shoulder region 09/08/2020   Ectatic abdominal aorta (HCC) 09/04/2020   OSA (obstructive sleep apnea) 08/07/2020   Witnessed apneic spells 07/12/2020   Pain in joint of left shoulder 11/16/2019   Encounter for preventive health examination 10/23/2017   Obesity, diabetes, and hypertension syndrome (HCC) 08/18/2015   Vitamin D deficiency 02/19/2014   Seasonal allergic rhinitis 02/02/2014   Dysphagia, unspecified(787.20) 03/07/2013   Hepatic steatosis 03/07/2013   Hyperlipidemia    Essential hypertension     Past Surgical History:   Procedure Laterality Date   BIOPSY OF SKIN SUBCUTANEOUS TISSUE AND/OR MUCOUS MEMBRANE     nose   CHOLESTEATOMA EXCISION Left 2000   left ear   COLONOSCOPY WITH PROPOFOL N/A 11/17/2017   Procedure: COLONOSCOPY WITH PROPOFOL;  Surgeon: Toney Reil, MD;  Location: [facility] ENDOSCOPY;  Service: Gastroenterology;  Laterality: N/A;   ESOPHAGOGASTRODUODENOSCOPY (EGD) WITH PROPOFOL N/A 09/16/2020   Procedure: ESOPHAGOGASTRODUODENOSCOPY (EGD) WITH PROPOFOL;  Surgeon: Toney Reil, MD;  Location: [facility] ENDOSCOPY;  Service: Gastroenterology;  Laterality: N/A;   HERNIA REPAIR     KNEE ARTHROSCOPY WITH MEDIAL MENISECTOMY Left 07/01/2016   Procedure: left knee arthroscopy with partial medial menisectomy;  Surgeon: Kennedy Bucker, MD;  Location: [facility] ORS;  Service: Orthopedics;  Laterality: Left;   TONSILLECTOMY AND ADENOIDECTOMY     UMBILICAL HERNIA REPAIR N/A 08/20/2019   Procedure: LAPAROSCOPIC ASSISTED UMBILICAL HERNIA REPAIR WITH MESH;  Surgeon: Luretha Murphy, MD;  Location: [facility] ORS;  Service: General;  Laterality: N/A;       Home Medications    Prior to Admission medications   Medication Sig Start Date End Date Taking? Authorizing Provider  ALPRAZolam (XANAX) 0.25 MG tablet Take 1 tablet (0.25 mg total) by mouth at bedtime as needed for anxiety. 03/16/22   Sherlene Shams, MD  aspirin 81 MG tablet Take 81 mg by mouth at bedtime.    [provider]  atorvastatin (LIPITOR) 80 MG tablet Take 1 tablet (80 mg total) by mouth daily. 04/11/23 07/10/24  Sherlene Shams,  MD  metFORMIN (GLUCOPHAGE) 500 MG tablet TAKE 1 TABLET TWICE A DAY  WITH MEALS 09/06/22   Sherlene Shams, MD  montelukast (SINGULAIR) 10 MG tablet TAKE 1 TABLET AT BEDTIME 04/06/23   Sherlene Shams, MD  pantoprazole (PROTONIX) 40 MG tablet Take 1 tablet by mouth once daily 11/15/22   Sherlene Shams, MD  sildenafil (REVATIO) 20 MG tablet TAKE 1 TABLET BY MOUTH THREE TIMES DAILY 04/11/23   Sherlene Shams, MD   telmisartan-hydrochlorothiazide (MICARDIS HCT) 40-12.5 MG tablet Take 1 tablet by mouth daily. 04/19/23   Sherlene Shams, MD  tirzepatide [facility]) 7.5 MG/0.5ML Pen Inject 7.5 mg into the skin once a week. 03/21/23   Sherlene Shams, MD  traZODone (DESYREL) 100 MG tablet TAKE 1 TABLET AT BEDTIME ASNEEDED FOR SLEEP 12/08/22   Sherlene Shams, MD    Family History Family History  Problem Relation Age of Onset   Diabetes Mother    Hypertension Mother    Uterine cancer Mother    Aortic aneurysm Father 37   Heart disease Father 29   AAA (abdominal aortic aneurysm) Father    Kidney cancer Sister    Atrial fibrillation Son     Social History Social History   Tobacco Use   Smoking status: Never   Smokeless tobacco: Never  Vaping Use   Vaping Use: Never used  Substance Use Topics   Alcohol use: Not Currently   Drug use: No     Allergies   Niacin, Shellfish allergy, Scopolamine, and Niacin and related   Review of Systems Review of Systems   Physical Exam Triage Vital Signs ED Triage Vitals  Enc Vitals Group     BP 05/05/23 0942 95/64     Pulse Rate 05/05/23 0942 81     Resp 05/05/23 0942 16     Temp 05/05/23 0942 97.9 F (36.6 C)     Temp Source 05/05/23 0942 Oral     SpO2 05/05/23 0942 97 %     Weight --      Height --      Head Circumference --      Peak Flow --      Pain Score 05/05/23 0949 0     Pain Loc --      Pain Edu? --      Excl. in GC? --    No data found.  Updated Vital Signs BP 95/64 (BP Location: Left Arm)   Pulse 81   Temp 97.9 F (36.6 C) (Oral)   Resp 16   SpO2 97%   Visual Acuity Right Eye Distance:   Left Eye Distance:   Bilateral Distance:    Right Eye Near:   Left Eye Near:    Bilateral Near:     Physical Exam Vitals reviewed.  Constitutional:      Appearance: He is well-developed. He is ill-appearing.  HENT:     Right Ear: Tympanic membrane normal.     Left Ear: Tympanic membrane normal.     Nose:     Left Sinus:  Maxillary sinus tenderness present.     Mouth/Throat:     Mouth: Mucous membranes are moist.     Pharynx: No oropharyngeal exudate or posterior oropharyngeal erythema.     Tonsils: No tonsillar exudate.  Cardiovascular:     Rate and Rhythm: Normal rate and regular rhythm.     Heart sounds: Normal heart sounds.  Pulmonary:     Effort: Pulmonary effort is normal.  Breath sounds: Normal breath sounds.  Skin:    General: Skin is warm and dry.  Neurological:     General: No focal deficit present.     Mental Status: He is alert and oriented to person, place, and time.  Psychiatric:        Mood and Affect: Mood normal.        Behavior: Behavior normal.      UC Treatments / Results  Labs (all labs ordered are listed, but only abnormal results are displayed) Labs Reviewed - No data to display  EKG   Radiology No results found.  Procedures Procedures (including critical care time)  Medications Ordered in UC Medications - No data to display  Initial Impression / Assessment and Plan / UC Course  I have reviewed the triage vital signs and the nursing notes.  Pertinent labs & imaging results that were available during my care of the patient were reviewed by me and considered in my medical decision making (see chart for details).   ASHAUN GAUGHAN is a 57 y.o. male presenting with sinusitis and cough. Patient is afebrile without recent antipyretics, satting well on room air. Overall is ill appearing though non-toxic, well hydrated, without respiratory distress. Pulmonary exam is unremarkable.  Lungs CTAB without wheezing, rhonchi, rales. RRR without murmurs, rubs, gallops.  No pharyngeal erythema or peritonsillar exudates.  He does have left-sided maxillary sinus tenderness.  TMs are WNL bilaterally.  Reviewed relevant chart history.   Given duration of his symptoms, suspect acute bacterial process secondary to past viral sinusitis now developing cough.  Will treat patient with  antibiotic therapy, also prescribing prednisone taper to speed resolution of sinus inflammation.  Will also recommend supportive care and use of OTC medication for symptom control including cough suppressants and mucolytic.  Counseled patient on potential for adverse effects with medications prescribed/recommended today, ER and return-to-clinic precautions discussed, patient verbalized understanding and agreement with care plan.  Final Clinical Impressions(s) / UC Diagnoses   Final diagnoses:  None   Discharge Instructions   None    ED Prescriptions   None    PDMP not reviewed this encounter.   Charma Igo, [place] 05/05/23 1002

## 2023-05-05 NOTE — ED Triage Notes (Signed)
Sore throat and cough x 1.5 weeks. Treating with Flonase and allergy meds with no improvement.   Denies fever or SOB.

## 2023-05-05 NOTE — Discharge Instructions (Addendum)
We are treating you for presumed bacterial sinus infection with an antibiotic.  Also prescribing a steroid medication which will speed resolution of the sinus inflammation.  Recommend that you continue with over-the-counter medication including Flonase.  Also suggest a mucolytic medication such as guaifenesin (Mucinex).  This latter medication will help thin mucus secretions and make your cough more effective.  You can use an over-the-counter cough suppressant such as dextromethorphan.  This medication is in most over-the-counter cough preparations.

## 2023-05-06 ENCOUNTER — Other Ambulatory Visit: Payer: Self-pay | Admitting: Internal Medicine

## 2023-06-06 ENCOUNTER — Other Ambulatory Visit: Payer: Self-pay | Admitting: Internal Medicine

## 2023-06-22 ENCOUNTER — Other Ambulatory Visit: Payer: Self-pay | Admitting: Internal Medicine

## 2023-06-22 ENCOUNTER — Ambulatory Visit
Admission: RE | Admit: 2023-06-22 | Discharge: 2023-06-22 | Disposition: A | Discharge status: Home / Self Care | Payer: BC Managed Care – PPO | Source: Ambulatory Visit | Attending: Emergency Medicine | Admitting: Emergency Medicine

## 2023-06-22 VITALS — BP 108/75 | HR 79 | Temp 98.8°F | Resp 18

## 2023-06-22 DIAGNOSIS — J209 Acute bronchitis, unspecified: Secondary | ICD-10-CM

## 2023-06-22 DIAGNOSIS — J309 Allergic rhinitis, unspecified: Secondary | ICD-10-CM

## 2023-06-22 DIAGNOSIS — R051 Acute cough: Secondary | ICD-10-CM | POA: Diagnosis not present

## 2023-06-22 MED ORDER — PROMETHAZINE-DM 6.25-15 MG/5ML PO SYRP: 5.0000 mL | ORAL_SOLUTION | Freq: Four times a day (QID) | ORAL | 0 refills | Status: DC | PRN

## 2023-06-22 MED ORDER — AZITHROMYCIN 250 MG PO TABS: 250.0000 mg | ORAL_TABLET | Freq: Every day | ORAL | 0 refills | Status: DC

## 2023-06-22 NOTE — ED Provider Notes (Signed)
Christopher Vazquez    CSN: [id number] Arrival date & time: 06/22/23  0807      History   Chief Complaint Chief Complaint  Patient presents with   Cough    I've had a cough for about 2 weeks. A lot of head and chest congestion. - Entered by patient    HPI Christopher Vazquez is a 57 y.o. male.  Patient presents with 2 week history of congestion and cough.  No fever, shortness of breath, chest pain, or other symptoms.  Treatment at home with Mucinex.  Patient was seen at this urgent care on 05/05/2023; diagnosed with sinusitis; treated with Augmentin and prednisone.  His medical history includes hypertension, obesity, allergies, diabetes.   The history is provided by the patient and medical records.    Past Medical History:  Diagnosis Date   Cancer (HCC)    basal cell-nose   Complication of anesthesia    with tonsillectomy sever swelling of throat, possible laryngiospasm.   Diabetes mellitus without complication (HCC)    GERD (gastroesophageal reflux disease)    Hyperlipidemia    Hypertension     Patient Active Problem List   Diagnosis Date Noted   Long-term current use of injectable noninsulin antidiabetic medication 03/21/2023   Onychomycosis 12/09/2022   Strain of latissimus dorsi muscle 06/07/2022   Grief 03/16/2022   Insomnia 09/08/2021   Exertional dyspnea 11/02/2020   Esophageal dysphagia    Disorder of bursae of shoulder region 09/08/2020   Ectatic abdominal aorta (HCC) 09/04/2020   OSA (obstructive sleep apnea) 08/07/2020   Witnessed apneic spells 07/12/2020   Pain in joint of left shoulder 11/16/2019   Encounter for preventive health examination 10/23/2017   Obesity, diabetes, and hypertension syndrome (HCC) 08/18/2015   Vitamin D deficiency 02/19/2014   Seasonal allergic rhinitis 02/02/2014   Dysphagia, unspecified(787.20) 03/07/2013   Hepatic steatosis 03/07/2013   Hyperlipidemia    Essential hypertension     Past Surgical History:  Procedure  Laterality Date   BIOPSY OF SKIN SUBCUTANEOUS TISSUE AND/OR MUCOUS MEMBRANE     nose   CHOLESTEATOMA EXCISION Left 2000   left ear   COLONOSCOPY WITH PROPOFOL N/A 11/17/2017   Procedure: COLONOSCOPY WITH PROPOFOL;  Surgeon: Toney Reil, MD;  Location: [facility] ENDOSCOPY;  Service: Gastroenterology;  Laterality: N/A;   ESOPHAGOGASTRODUODENOSCOPY (EGD) WITH PROPOFOL N/A 09/16/2020   Procedure: ESOPHAGOGASTRODUODENOSCOPY (EGD) WITH PROPOFOL;  Surgeon: Toney Reil, MD;  Location: [facility] ENDOSCOPY;  Service: Gastroenterology;  Laterality: N/A;   HERNIA REPAIR     KNEE ARTHROSCOPY WITH MEDIAL MENISECTOMY Left 07/01/2016   Procedure: left knee arthroscopy with partial medial menisectomy;  Surgeon: Kennedy Bucker, MD;  Location: [facility] ORS;  Service: Orthopedics;  Laterality: Left;   TONSILLECTOMY AND ADENOIDECTOMY     UMBILICAL HERNIA REPAIR N/A 08/20/2019   Procedure: LAPAROSCOPIC ASSISTED UMBILICAL HERNIA REPAIR WITH MESH;  Surgeon: Luretha Murphy, MD;  Location: [facility] ORS;  Service: General;  Laterality: N/A;       Home Medications    Prior to Admission medications   Medication Sig Start Date End Date Taking? Authorizing Provider  azithromycin (ZITHROMAX) 250 MG tablet Take 1 tablet (250 mg total) by mouth daily. Take first 2 tablets together, then 1 every day until finished. 06/22/23  Yes Mickie Bail, NP  promethazine-dextromethorphan (PROMETHAZINE-DM) 6.25-15 MG/5ML syrup Take 5 mLs by mouth 4 (four) times daily as needed. 06/22/23  Yes Mickie Bail, NP  ALPRAZolam Prudy Feeler) 0.25 MG tablet Take 1 tablet (0.25 mg  total) by mouth at bedtime as needed for anxiety. 03/16/22   Sherlene Shams, MD  amoxicillin-clavulanate (AUGMENTIN) 875-125 MG tablet Take 1 tablet by mouth every 12 (twelve) hours. Patient not taking: Reported on 06/22/2023 05/05/23   Immordino, Jeannett Senior, FNP  aspirin 81 MG tablet Take 81 mg by mouth at bedtime.    [provider]  atorvastatin (LIPITOR) 80 MG tablet Take 1  tablet (80 mg total) by mouth daily. 04/11/23 07/10/24  Sherlene Shams, MD  metFORMIN (GLUCOPHAGE) 500 MG tablet TAKE 1 TABLET TWICE A DAY  WITH MEALS 09/06/22   Sherlene Shams, MD  montelukast (SINGULAIR) 10 MG tablet TAKE 1 TABLET AT BEDTIME 04/06/23   Sherlene Shams, MD  pantoprazole (PROTONIX) 40 MG tablet Take 1 tablet by mouth once daily 11/15/22   Sherlene Shams, MD  sildenafil (REVATIO) 20 MG tablet TAKE 1 TABLET BY MOUTH THREE TIMES DAILY 06/07/23   Sherlene Shams, MD  telmisartan-hydrochlorothiazide (MICARDIS HCT) 40-12.5 MG tablet Take 1 tablet by mouth daily. 04/19/23   Sherlene Shams, MD  tirzepatide [facility]) 7.5 MG/0.5ML Pen Inject 7.5 mg into the skin once a week. 03/21/23   Sherlene Shams, MD  traZODone (DESYREL) 100 MG tablet TAKE 1 TABLET AT BEDTIME ASNEEDED FOR SLEEP 12/08/22   Sherlene Shams, MD    Family History Family History  Problem Relation Age of Onset   Diabetes Mother    Hypertension Mother    Uterine cancer Mother    Aortic aneurysm Father 3   Heart disease Father 86   AAA (abdominal aortic aneurysm) Father    Kidney cancer Sister    Atrial fibrillation Son     Social History Social History   Tobacco Use   Smoking status: Never   Smokeless tobacco: Never  Vaping Use   Vaping status: Never Used  Substance Use Topics   Alcohol use: Not Currently   Drug use: No     Allergies   Niacin, Shellfish allergy, Scopolamine, and Niacin and related   Review of Systems Review of Systems  Constitutional:  Negative for chills and fever.  HENT:  Positive for congestion. Negative for ear pain and sore throat.   Respiratory:  Positive for cough. Negative for shortness of breath.   Cardiovascular:  Negative for chest pain and palpitations.     Physical Exam Triage Vital Signs ED Triage Vitals  Encounter Vitals Group     BP      Systolic BP Percentile      Diastolic BP Percentile      Pulse      Resp      Temp      Temp src      SpO2      Weight       Height      Head Circumference      Peak Flow      Pain Score      Pain Loc      Pain Education      Exclude from Growth Chart    No data found.  Updated Vital Signs BP 108/75   Pulse 79   Temp 98.8 F (37.1 C)   Resp 18   SpO2 98%   Visual Acuity Right Eye Distance:   Left Eye Distance:   Bilateral Distance:    Right Eye Near:   Left Eye Near:    Bilateral Near:     Physical Exam Vitals and nursing note reviewed.  Constitutional:      General: He is not in acute distress.    Appearance: He is well-developed.  HENT:     Right Ear: Tympanic membrane normal.     Left Ear: Tympanic membrane normal.     Nose: Congestion present.     Mouth/Throat:     Mouth: Mucous membranes are moist.     Pharynx: Oropharynx is clear.  Cardiovascular:     Rate and Rhythm: Normal rate and regular rhythm.     Heart sounds: Normal heart sounds.  Pulmonary:     Effort: Pulmonary effort is normal. No respiratory distress.     Breath sounds: Normal breath sounds.  Musculoskeletal:     Cervical back: Neck supple.  Skin:    General: Skin is warm and dry.  Neurological:     Mental Status: He is alert.      UC Treatments / Results  Labs (all labs ordered are listed, but only abnormal results are displayed) Labs Reviewed - No data to display  EKG   Radiology No results found.  Procedures Procedures (including critical care time)  Medications Ordered in UC Medications - No data to display  Initial Impression / Assessment and Plan / UC Course  I have reviewed the triage vital signs and the nursing notes.  Pertinent labs & imaging results that were available during my care of the patient were reviewed by me and considered in my medical decision making (see chart for details).    Acute bronchitis, allergic sinusitis, cough.  Patient has been symptomatic for 2 weeks.  Treating today with Zithromax.  Treating cough with Promethazine DM; discussed precautions for  drowsiness with this medication.  Instructed patient to follow-up with his PCP due to the recurrence of his symptoms.  Education provided on bronchitis and allergic rhinitis.  He agrees to plan of care.  Final Clinical Impressions(s) / UC Diagnoses   Final diagnoses:  Acute bronchitis, unspecified organism  Allergic sinusitis  Acute cough     Discharge Instructions      Take the Zithromax as directed.    Take the promethazine DM as directed.  Do not drive, operate machinery, drink alcohol, or perform dangerous activities while taking this medication as it may cause drowsiness.  Follow up with your primary care provider.        ED Prescriptions     Medication Sig Dispense Auth. Provider   azithromycin (ZITHROMAX) 250 MG tablet Take 1 tablet (250 mg total) by mouth daily. Take first 2 tablets together, then 1 every day until finished. 6 tablet Mickie Bail, NP   promethazine-dextromethorphan (PROMETHAZINE-DM) 6.25-15 MG/5ML syrup Take 5 mLs by mouth 4 (four) times daily as needed. 118 mL Mickie Bail, NP      PDMP not reviewed this encounter.   Mickie Bail, NP 06/22/23 [phone]

## 2023-06-22 NOTE — ED Triage Notes (Signed)
Patient to Urgent Care with complaints of cough, head, and chest congestion. Sinus pain and pressure. Reports having coughing spells and producing discolored mucus. Denies any fevers.   Reports symptoms started two weeks ago. Taken multiple Covid tests w/ negative results.   Has been taking Mucinex-DM/ Mucinex.

## 2023-06-22 NOTE — Discharge Instructions (Addendum)
Take the Zithromax as directed.    Take the promethazine DM as directed.  Do not drive, operate machinery, drink alcohol, or perform dangerous activities while taking this medication as it may cause drowsiness.  Follow up with your primary care provider.

## 2023-07-04 ENCOUNTER — Other Ambulatory Visit (HOSPITAL_COMMUNITY): Payer: Self-pay

## 2023-07-04 ENCOUNTER — Telehealth: Payer: Self-pay

## 2023-07-04 ENCOUNTER — Telehealth: Payer: Self-pay | Admitting: Internal Medicine

## 2023-07-04 NOTE — Telephone Encounter (Signed)
PA needed for Mounjaro.  

## 2023-07-04 NOTE — Telephone Encounter (Signed)
Pharmacy Patient Advocate Encounter   Received notification from Pt Calls Messages that prior authorization for Mounjaro 7.5MG /0.5ML pen-injectors is required/requested.   Insurance verification completed.   The patient is insured through [facility] .   Per test claim: APPROVED from 07/04/23 to 07/02/24. Ran test claim, Copay is $30. This test claim was processed through [facility]- copay amounts may vary at other pharmacies due to pharmacy/plan contracts, or as the patient moves through the different stages of their insurance plan.  Key: [id number] PA Case ID #: [id number]

## 2023-07-04 NOTE — Telephone Encounter (Signed)
PA request has been Approved. New Encounter created for follow up. For additional info see Pharmacy Prior Auth telephone encounter from 07/04/23.

## 2023-07-04 NOTE — Telephone Encounter (Signed)
Pt is aware.  

## 2023-07-04 NOTE — Telephone Encounter (Signed)
Patient's insurance company, [employer] called. Patient needs his MOUNJARO 7.5 MG/0.5ML Pen refilled asap. [employer] said if Prior Authorization Dept would call [phone] patient would have his medication expedited by tomorrow.

## 2023-07-06 ENCOUNTER — Other Ambulatory Visit: Payer: Self-pay | Admitting: Internal Medicine

## 2023-07-12 ENCOUNTER — Ambulatory Visit: Payer: BC Managed Care – PPO | Admitting: Internal Medicine

## 2023-07-21 ENCOUNTER — Encounter: Payer: Self-pay | Admitting: Internal Medicine

## 2023-07-21 ENCOUNTER — Ambulatory Visit (INDEPENDENT_AMBULATORY_CARE_PROVIDER_SITE_OTHER): Payer: BC Managed Care – PPO | Admitting: Internal Medicine

## 2023-07-21 VITALS — BP 96/58 | HR 65 | Temp 97.8°F | Ht 68.0 in | Wt 161.4 lb

## 2023-07-21 DIAGNOSIS — R1011 Right upper quadrant pain: Secondary | ICD-10-CM

## 2023-07-21 DIAGNOSIS — E669 Obesity, unspecified: Secondary | ICD-10-CM

## 2023-07-21 DIAGNOSIS — Z7984 Long term (current) use of oral hypoglycemic drugs: Secondary | ICD-10-CM

## 2023-07-21 DIAGNOSIS — E1169 Type 2 diabetes mellitus with other specified complication: Secondary | ICD-10-CM | POA: Diagnosis not present

## 2023-07-21 DIAGNOSIS — E782 Mixed hyperlipidemia: Secondary | ICD-10-CM | POA: Diagnosis not present

## 2023-07-21 DIAGNOSIS — R1031 Right lower quadrant pain: Secondary | ICD-10-CM | POA: Diagnosis not present

## 2023-07-21 DIAGNOSIS — Z7985 Long-term (current) use of injectable non-insulin antidiabetic drugs: Secondary | ICD-10-CM

## 2023-07-21 DIAGNOSIS — E1159 Type 2 diabetes mellitus with other circulatory complications: Secondary | ICD-10-CM | POA: Diagnosis not present

## 2023-07-21 DIAGNOSIS — Z23 Encounter for immunization: Secondary | ICD-10-CM | POA: Diagnosis not present

## 2023-07-21 DIAGNOSIS — I152 Hypertension secondary to endocrine disorders: Secondary | ICD-10-CM

## 2023-07-21 DIAGNOSIS — K760 Fatty (change of) liver, not elsewhere classified: Secondary | ICD-10-CM

## 2023-07-21 LAB — URINALYSIS, ROUTINE W REFLEX MICROSCOPIC
Bilirubin Urine: NEGATIVE
Hgb urine dipstick: NEGATIVE
Ketones, ur: NEGATIVE
Leukocytes,Ua: NEGATIVE
Nitrite: NEGATIVE
RBC / HPF: NONE SEEN (ref 0–?)
Specific Gravity, Urine: 1.01 (ref 1.000–1.030)
Total Protein, Urine: NEGATIVE
Urine Glucose: NEGATIVE
Urobilinogen, UA: 0.2 (ref 0.0–1.0)
WBC, UA: NONE SEEN (ref 0–?)
pH: 6 (ref 5.0–8.0)

## 2023-07-21 LAB — COMPREHENSIVE METABOLIC PANEL
ALT: 37 U/L (ref 0–53)
AST: 40 U/L — ABNORMAL HIGH (ref 0–37)
Albumin: 4.1 g/dL (ref 3.5–5.2)
Alkaline Phosphatase: 43 U/L (ref 39–117)
BUN: 18 mg/dL (ref 6–23)
CO2: 29 meq/L (ref 19–32)
Calcium: 9.4 mg/dL (ref 8.4–10.5)
Chloride: 102 meq/L (ref 96–112)
Creatinine, Ser: 1.03 mg/dL (ref 0.40–1.50)
GFR: 81.3 mL/min (ref >60.00)
Glucose, Bld: 72 mg/dL (ref 70–99)
Potassium: 4.1 meq/L (ref 3.5–5.1)
Sodium: 140 meq/L (ref 135–145)
Total Bilirubin: 0.4 mg/dL (ref 0.2–1.2)
Total Protein: 6.5 g/dL (ref 6.0–8.3)

## 2023-07-21 LAB — LIPID PANEL
Cholesterol: 94 mg/dL (ref 0–200)
HDL: 37.9 mg/dL — ABNORMAL LOW (ref >39.00)
LDL Cholesterol: 33 mg/dL (ref 0–99)
NonHDL: 56.13
Total CHOL/HDL Ratio: 2
Triglycerides: 116 mg/dL (ref 0.0–149.0)
VLDL: 23.2 mg/dL (ref 0.0–40.0)

## 2023-07-21 LAB — LDL CHOLESTEROL, DIRECT: Direct LDL: 52 mg/dL

## 2023-07-21 LAB — LIPASE: Lipase: 146 U/L — ABNORMAL HIGH (ref 11.0–59.0)

## 2023-07-21 LAB — HEMOGLOBIN A1C: Hgb A1c MFr Bld: 5.3 % (ref 4.6–6.5)

## 2023-07-21 MED ORDER — ATORVASTATIN CALCIUM 80 MG PO TABS: 80.0000 mg | ORAL_TABLET | Freq: Every day | ORAL | 5 refills | Status: DC

## 2023-07-21 MED ORDER — TELMISARTAN 20 MG PO TABS: 20.0000 mg | ORAL_TABLET | Freq: Every day | ORAL | 2 refills | Status: DC

## 2023-07-21 MED ORDER — METFORMIN HCL 500 MG PO TABS: 500.0000 mg | ORAL_TABLET | Freq: Two times a day (BID) | ORAL | 3 refills | Status: DC

## 2023-07-21 MED ORDER — TRAZODONE HCL 100 MG PO TABS: ORAL_TABLET | ORAL | 0 refills | Status: DC

## 2023-07-21 NOTE — Patient Instructions (Signed)
Your insurance will not cover the CT unless the ultrasound is not conclusive . So we will start with the ultrasound  and the urinalysis  The blood pressure has improved with your weight loss.  Stop your current telmisartan  for 2 days,  then start the lower dose at night 20 mg

## 2023-07-21 NOTE — Progress Notes (Signed)
Subjective:  Patient ID: Christopher Vazquez, male    DOB: 1966/01/11  Age: 57 y.o. MRN: [id number]  CC: The primary encounter diagnosis was Obesity, diabetes, and hypertension syndrome (HCC). Diagnoses of Mixed hyperlipidemia, Right lower quadrant pain, Right upper quadrant abdominal pain, and Need for influenza vaccination were also pertinent to this visit.   HPI Christopher Vazquez presents for  Chief Complaint  Patient presents with   Medical Management of Chronic Issues   Cc:  history of umbilical hernia repair ,  now with new onset right sided abd tenderness . Some intermittent nausea/queasiness brought on by eating fatty food.   Feels a bulge in the area one hour after eating .  Bowels moving regulary and normal size .  FH of bladder,  renal cell CA,  NASH/cirrhosis  and ovarian CA   2) follow up on type 2 DM , fatty liver,  hypertension,  obesity .  He has lost >    Outpatient Medications Prior to Visit  Medication Sig Dispense Refill   ALPRAZolam (XANAX) 0.25 MG tablet Take 1 tablet (0.25 mg total) by mouth at bedtime as needed for anxiety. 30 tablet 0   aspirin 81 MG tablet Take 81 mg by mouth at bedtime.     montelukast (SINGULAIR) 10 MG tablet TAKE 1 TABLET AT BEDTIME 90 tablet 3   MOUNJARO 7.5 MG/0.5ML Pen INJECT THE CONTENTS OF ONE PEN UNDER THE SKIN WEEKLY ON THE SAME DAY EACH WEEK 2 mL 3   pantoprazole (PROTONIX) 40 MG tablet Take 1 tablet by mouth once daily 90 tablet 3   sildenafil (REVATIO) 20 MG tablet TAKE 1 TABLET BY MOUTH THREE TIMES DAILY 90 tablet 5   atorvastatin (LIPITOR) 80 MG tablet Take 1 tablet (80 mg total) by mouth daily. 90 tablet 1   metFORMIN (GLUCOPHAGE) 500 MG tablet TAKE 1 TABLET TWICE A DAY  WITH MEALS 180 tablet 3   telmisartan-hydrochlorothiazide (MICARDIS HCT) 40-12.5 MG tablet Take 1 tablet by mouth daily. 90 tablet 1   traZODone (DESYREL) 100 MG tablet TAKE 1 TABLET AT BEDTIME ASNEEDED FOR SLEEP 270 tablet 0   amoxicillin-clavulanate (AUGMENTIN)  875-125 MG tablet Take 1 tablet by mouth every 12 (twelve) hours. (Patient not taking: Reported on 06/22/2023) 14 tablet 0   azithromycin (ZITHROMAX) 250 MG tablet Take 1 tablet (250 mg total) by mouth daily. Take first 2 tablets together, then 1 every day until finished. (Patient not taking: Reported on 07/21/2023) 6 tablet 0   promethazine-dextromethorphan (PROMETHAZINE-DM) 6.25-15 MG/5ML syrup Take 5 mLs by mouth 4 (four) times daily as needed. (Patient not taking: Reported on 07/21/2023) 118 mL 0   No facility-administered medications prior to visit.    Review of Systems;  Patient denies headache, fevers, malaise, unintentional weight loss, skin rash, eye pain, sinus congestion and sinus pain, sore throat, dysphagia,  hemoptysis , cough, dyspnea, wheezing, chest pain, palpitations, orthopnea, edema, abdominal pain, nausea, melena, diarrhea, constipation, flank pain, dysuria, hematuria, urinary  Frequency, nocturia, numbness, tingling, seizures,  Focal weakness, Loss of consciousness,  Tremor, insomnia, depression, anxiety, and suicidal ideation.      Objective:  BP (!) 96/58   Pulse 65   Temp 97.8 F (36.6 C) (Oral)   Ht 5\' 8"  (1.727 m)   Wt 161 lb 6.4 oz (73.2 kg)   SpO2 99%   BMI 24.54 kg/m   BP Readings from Last 3 Encounters:  07/21/23 (!) 96/58  06/22/23 108/75  05/05/23 95/64    Wt  Readings from Last 3 Encounters:  07/21/23 161 lb 6.4 oz (73.2 kg)  03/21/23 183 lb 12.8 oz (83.4 kg)  12/08/22 224 lb 9.6 oz (101.9 kg)    Physical Exam Vitals reviewed.  Constitutional:      General: He is not in acute distress.    Appearance: Normal appearance. He is normal weight. He is not ill-appearing, toxic-appearing or diaphoretic.  HENT:     Head: Normocephalic.  Eyes:     General: No scleral icterus.       Right eye: No discharge.        Left eye: No discharge.     Conjunctiva/sclera: Conjunctivae normal.  Cardiovascular:     Rate and Rhythm: Normal rate and regular  rhythm.     Heart sounds: Normal heart sounds.  Pulmonary:     Effort: Pulmonary effort is normal. No respiratory distress.     Breath sounds: Normal breath sounds.  Abdominal:     General: Abdomen is flat.     Palpations: Abdomen is soft.     Tenderness: There is abdominal tenderness.    Musculoskeletal:        General: Normal range of motion.     Cervical back: Normal range of motion.  Skin:    General: Skin is warm and dry.  Neurological:     General: No focal deficit present.     Mental Status: He is alert and oriented to person, place, and time. Mental status is at baseline.  Psychiatric:        Mood and Affect: Mood normal.        Behavior: Behavior normal.        Thought Content: Thought content normal.        Judgment: Judgment normal.    Lab Results  Component Value Date   HGBA1C 5.3 07/21/2023   HGBA1C 5.8 03/15/2023   HGBA1C 6.7 (H) 12/08/2022    Lab Results  Component Value Date   CREATININE 1.03 07/21/2023   CREATININE 1.39 03/15/2023   CREATININE 1.41 12/08/2022    Lab Results  Component Value Date   WBC 5.1 12/08/2022   HGB 14.7 12/08/2022   HCT 42.1 12/08/2022   PLT 236.0 12/08/2022   GLUCOSE 72 07/21/2023   CHOL 94 07/21/2023   TRIG 116.0 07/21/2023   HDL 37.90 (L) 07/21/2023   LDLDIRECT 52.0 07/21/2023   LDLCALC 33 07/21/2023   ALT 37 07/21/2023   AST 40 (H) 07/21/2023   NA 140 07/21/2023   K 4.1 07/21/2023   CL 102 07/21/2023   CREATININE 1.03 07/21/2023   BUN 18 07/21/2023   CO2 29 07/21/2023   TSH 2.27 12/08/2022   PSA 0.68 12/08/2022   HGBA1C 5.3 07/21/2023   MICROALBUR 2.1 (H) 03/15/2023    No results found.  Assessment & Plan:  .Obesity, diabetes, and hypertension syndrome (HCC) Assessment & Plan: Improved control since  Starting metformin 500 mg bid .  he has had success losing weight , with a weight loss of > 60lb s since starting  Ozempic   Lab Results  Component Value Date   HGBA1C 5.3 07/21/2023   Lab Results   Component Value Date   MICROALBUR 2.1 (H) 03/15/2023   MICROALBUR <0.7 03/16/2022       Orders: -     Hemoglobin A1c -     Comprehensive metabolic panel  Mixed hyperlipidemia -     Lipid panel -     LDL cholesterol, direct  Right lower quadrant  pain -     Urinalysis, Routine w reflex microscopic  Right upper quadrant abdominal pain Assessment & Plan: Suggestive of choledochlithiasis.  RUQ ordere  Orders: -     Lipase -     US ABDOMEN LIMITED RUQ (LIVER/GB); Future  Need for influenza vaccination -     Flu vaccine trivalent PF, 6mos and older(Flulaval,Afluria,Fluarix,Fluzone)  Other orders -     Atorvastatin Calcium; Take 1 tablet (80 mg total) by mouth daily.  Dispense: 90 tablet; Refill: 5 -     metFORMIN HCl; Take 1 tablet (500 mg total) by mouth 2 (two) times daily with a meal.  Dispense: 180 tablet; Refill: 3 -     traZODone HCl; TAKE 1 TABLET AT BEDTIME ASNEEDED FOR SLEEP  Dispense: 810 tablet; Refill: 0 -     Telmisartan; Take 1 tablet (20 mg total) by mouth daily.  Dispense: 30 tablet; Refill: 2    Follow-up: Return in about 3 months (around 10/20/2023) for follow up diabetes.   Sherlene Shams, MD

## 2023-07-22 DIAGNOSIS — R1031 Right lower quadrant pain: Secondary | ICD-10-CM | POA: Insufficient documentation

## 2023-07-22 NOTE — Assessment & Plan Note (Signed)
Improved control since  Starting metformin 500 mg bid .  he has had success losing weight , with a weight loss of > 60lb s since starting  Ozempic   Lab Results  Component Value Date   HGBA1C 5.3 07/21/2023   Lab Results  Component Value Date   MICROALBUR 2.1 (H) 03/15/2023   MICROALBUR <0.7 03/16/2022

## 2023-07-22 NOTE — Assessment & Plan Note (Signed)
Suggestive of choledochlithiasis.  RUQ ordere

## 2023-07-24 NOTE — Assessment & Plan Note (Signed)
Elevated ALT AND LIPASE NOTED ON [facility]  (MILD ELEVATION)

## 2023-07-29 ENCOUNTER — Ambulatory Visit
Admission: RE | Admit: 2023-07-29 | Discharge: 2023-07-29 | Disposition: A | Discharge status: Home / Self Care | Payer: BC Managed Care – PPO | Source: Ambulatory Visit | Attending: Internal Medicine | Admitting: Internal Medicine

## 2023-07-29 DIAGNOSIS — R1011 Right upper quadrant pain: Secondary | ICD-10-CM | POA: Insufficient documentation

## 2023-08-08 ENCOUNTER — Telehealth: Payer: Self-pay | Admitting: Internal Medicine

## 2023-08-08 DIAGNOSIS — K861 Other chronic pancreatitis: Secondary | ICD-10-CM

## 2023-08-08 NOTE — Telephone Encounter (Signed)
Patient need lab orders.

## 2023-08-11 ENCOUNTER — Other Ambulatory Visit (INDEPENDENT_AMBULATORY_CARE_PROVIDER_SITE_OTHER): Payer: BC Managed Care – PPO

## 2023-08-11 DIAGNOSIS — K861 Other chronic pancreatitis: Secondary | ICD-10-CM | POA: Diagnosis not present

## 2023-08-11 LAB — HEPATIC FUNCTION PANEL
ALT: 38 U/L (ref 0–53)
AST: 29 U/L (ref 0–37)
Albumin: 3.8 g/dL (ref 3.5–5.2)
Alkaline Phosphatase: 47 U/L (ref 39–117)
Bilirubin, Direct: 0.1 mg/dL (ref 0.0–0.3)
Total Bilirubin: 0.4 mg/dL (ref 0.2–1.2)
Total Protein: 5.8 g/dL — ABNORMAL LOW (ref 6.0–8.3)

## 2023-08-11 LAB — LIPASE: Lipase: 68 U/L — ABNORMAL HIGH (ref 11.0–59.0)

## 2023-08-23 ENCOUNTER — Ambulatory Visit: Payer: BC Managed Care – PPO | Admitting: Internal Medicine

## 2023-08-26 ENCOUNTER — Ambulatory Visit (INDEPENDENT_AMBULATORY_CARE_PROVIDER_SITE_OTHER): Payer: BC Managed Care – PPO | Admitting: Internal Medicine

## 2023-08-26 VITALS — BP 124/70 | HR 80 | Ht 68.0 in | Wt 164.6 lb

## 2023-08-26 DIAGNOSIS — R1011 Right upper quadrant pain: Secondary | ICD-10-CM

## 2023-08-26 DIAGNOSIS — Z7985 Long-term (current) use of injectable non-insulin antidiabetic drugs: Secondary | ICD-10-CM

## 2023-08-26 NOTE — Progress Notes (Unsigned)
Subjective:  Patient ID: LARENZ SEAMSTER, male    DOB: [birth year]  Age: 57 y.o. MRN: [id number]  CC: {There were no encounter diagnoses. (Refresh or delete this SmartLink)}   HPI ROBERT LABRADA presents for  Chief Complaint  Patient presents with   Medical Management of Chronic Issues    1 month follow up     Seen one month ago for follow up with report of  RUQ pain episodes u/s and  LFTs and lipase done  lipase was  3 x ULN,  decreased by 50% after suspending mounjaro ounjaro .  Since suspending mounjaro he has gained 16  lbs .  His nadir was 152 lbs and he started liberalizing hisfood choices and started eating poorly,  greasy meals. Weight increased to 166 before resuming mounjaor and is now 158  The episodes of RUQ pain have not recurred with sict diet and self restart of mounjaro x 2 weeks.     HTN:  since changing meds his systolic has ranged from 136 to 144  coincided with weigh    Outpatient Medications Prior to Visit  Medication Sig Dispense Refill   aspirin 81 MG tablet Take 81 mg by mouth at bedtime.     atorvastatin (LIPITOR) 80 MG tablet Take 1 tablet (80 mg total) by mouth daily. 90 tablet 5   metFORMIN (GLUCOPHAGE) 500 MG tablet Take 1 tablet (500 mg total) by mouth 2 (two) times daily with a meal. 180 tablet 3   montelukast (SINGULAIR) 10 MG tablet TAKE 1 TABLET AT BEDTIME 90 tablet 3   MOUNJARO 7.5 MG/0.5ML Pen INJECT THE CONTENTS OF ONE PEN UNDER THE SKIN WEEKLY ON THE SAME DAY EACH WEEK 2 mL 3   pantoprazole (PROTONIX) 40 MG tablet Take 1 tablet by mouth once daily 90 tablet 3   sildenafil (REVATIO) 20 MG tablet TAKE 1 TABLET BY MOUTH THREE TIMES DAILY 90 tablet 5   telmisartan (MICARDIS) 20 MG tablet Take 1 tablet (20 mg total) by mouth daily. 30 tablet 2   traZODone (DESYREL) 100 MG tablet TAKE 1 TABLET AT BEDTIME ASNEEDED FOR SLEEP 810 tablet 0   ALPRAZolam (XANAX) 0.25 MG tablet Take 1 tablet (0.25 mg total) by mouth at bedtime as needed for anxiety.  (Patient not taking: Reported on 08/26/2023) 30 tablet 0   No facility-administered medications prior to visit.    Review of Systems;  Patient denies headache, fevers, malaise, unintentional weight loss, skin rash, eye pain, sinus congestion and sinus pain, sore throat, dysphagia,  hemoptysis , cough, dyspnea, wheezing, chest pain, palpitations, orthopnea, edema, abdominal pain, nausea, melena, diarrhea, constipation, flank pain, dysuria, hematuria, urinary  Frequency, nocturia, numbness, tingling, seizures,  Focal weakness, Loss of consciousness,  Tremor, insomnia, depression, anxiety, and suicidal ideation.      Objective:  BP 124/70   Pulse 80   Ht 5\' 8"  (1.727 m)   Wt 164 lb 9.6 oz (74.7 kg)   SpO2 98%   BMI 25.03 kg/m   BP Readings from Last 3 Encounters:  08/26/23 124/70  07/21/23 (!) 96/58  06/22/23 108/75    Wt Readings from Last 3 Encounters:  08/26/23 164 lb 9.6 oz (74.7 kg)  07/21/23 161 lb 6.4 oz (73.2 kg)  03/21/23 183 lb 12.8 oz (83.4 kg)    Physical Exam  Lab Results  Component Value Date   HGBA1C 5.3 07/21/2023   HGBA1C 5.8 03/15/2023   HGBA1C 6.7 (H) 12/08/2022    Lab Results  Component  Value Date   CREATININE 1.03 07/21/2023   CREATININE 1.39 03/15/2023   CREATININE 1.41 12/08/2022    Lab Results  Component Value Date   WBC 5.1 12/08/2022   HGB 14.7 12/08/2022   HCT 42.1 12/08/2022   PLT 236.0 12/08/2022   GLUCOSE 72 07/21/2023   CHOL 94 07/21/2023   TRIG 116.0 07/21/2023   HDL 37.90 (L) 07/21/2023   LDLDIRECT 52.0 07/21/2023   LDLCALC 33 07/21/2023   ALT 38 08/11/2023   AST 29 08/11/2023   NA 140 07/21/2023   K 4.1 07/21/2023   CL 102 07/21/2023   CREATININE 1.03 07/21/2023   BUN 18 07/21/2023   CO2 29 07/21/2023   TSH 2.27 12/08/2022   PSA 0.68 12/08/2022   HGBA1C 5.3 07/21/2023   MICROALBUR 2.1 (H) 03/15/2023    US Abdomen Limited RUQ (LIVER/GB)  Result Date: 07/29/2023 CLINICAL DATA:  Right upper quadrant pain EXAM:  ULTRASOUND ABDOMEN LIMITED RIGHT UPPER QUADRANT COMPARISON:  None Available. FINDINGS: Gallbladder: No gallstones or wall thickening visualized. No sonographic Murphy sign noted by sonographer. Common bile duct: Diameter: 2 mm Liver: No focal lesion identified. Within normal limits in parenchymal echogenicity. Portal vein is patent on color Doppler imaging with normal direction of blood flow towards the liver. Other: None. IMPRESSION: No cholelithiasis or sonographic evidence for acute cholecystitis. Electronically Signed   By: Annia Belt M.D.   On: 07/29/2023 08:10    Assessment & Plan:  .There are no diagnoses linked to this encounter.   I provided 30 minutes of face-to-face time during this encounter reviewing patient's last visit with me, patient's  most recent visit with cardiology,  nephrology,  and neurology,  recent surgical and non surgical procedures, previous  labs and imaging studies, counseling on currently addressed issues,  and post visit ordering to diagnostics and therapeutics .   Follow-up: No follow-ups on file.   Sherlene Shams, MD

## 2023-08-26 NOTE — Patient Instructions (Signed)
You are at an ideal weight  If lipase is elevated you will need to STOP Mounjaro because it may be damaging your pancreas  If it is normal.  You will need to reduce your dose OR LENGTHEN the interval between doses to 10 days

## 2023-08-27 ENCOUNTER — Encounter: Payer: Self-pay | Admitting: Internal Medicine

## 2023-08-27 LAB — COMPLETE METABOLIC PANEL WITH GFR
AG Ratio: 2 (calc) (ref 1.0–2.5)
ALT: 24 U/L (ref 9–46)
AST: 22 U/L (ref 10–35)
Albumin: 4.2 g/dL (ref 3.6–5.1)
Alkaline phosphatase (APISO): 55 U/L (ref 35–144)
BUN: 16 mg/dL (ref 7–25)
CO2: 30 mmol/L (ref 20–32)
Calcium: 9.3 mg/dL (ref 8.6–10.3)
Chloride: 107 mmol/L (ref 98–110)
Creat: 1.04 mg/dL (ref 0.70–1.30)
Globulin: 2.1 g/dL (ref 1.9–3.7)
Glucose, Bld: 90 mg/dL (ref 65–99)
Potassium: 4.3 mmol/L (ref 3.5–5.3)
Sodium: 144 mmol/L (ref 135–146)
Total Bilirubin: 0.4 mg/dL (ref 0.2–1.2)
Total Protein: 6.3 g/dL (ref 6.1–8.1)
eGFR: 84 mL/min/{1.73_m2} (ref >=60)

## 2023-08-27 LAB — LIPASE: Lipase: 114 U/L — ABNORMAL HIGH (ref 7–60)

## 2023-08-28 MED ORDER — METFORMIN HCL 1000 MG PO TABS: 1000.0000 mg | ORAL_TABLET | Freq: Two times a day (BID) | ORAL | 0 refills | Status: DC

## 2023-08-28 NOTE — Assessment & Plan Note (Addendum)
Greggory Keen has been discontinued today due to evidence of pancreatic inflammation . iNCREASE METFORMIN TO 1000 MG BID

## 2023-08-28 NOTE — Assessment & Plan Note (Addendum)
His recent episode may have been due to gallstone pancreatitis  based on ultrasound, history and mildly elevated lipase.  He has resumed GLP 1 agonist due to inability to control his appetite without medication.  However his lipase has increased again necessitating discontinuation of medication. .  Lab Results  Component Value Date   LIPASE 114 (H) 08/26/2023

## 2023-08-29 ENCOUNTER — Ambulatory Visit: Payer: BC Managed Care – PPO | Admitting: Internal Medicine

## 2023-08-29 NOTE — Telephone Encounter (Signed)
Patient just called back again about his blood pressure. He sent a message on MyChart. His number is [phone]

## 2023-08-29 NOTE — Telephone Encounter (Signed)
Pt would like to be called regarding the mychart message he sent

## 2023-08-30 ENCOUNTER — Encounter: Payer: Self-pay | Admitting: Internal Medicine

## 2023-08-30 DIAGNOSIS — I10 Essential (primary) hypertension: Secondary | ICD-10-CM

## 2023-08-31 MED ORDER — TELMISARTAN-HCTZ 80-25 MG PO TABS: 1.0000 | ORAL_TABLET | Freq: Every day | ORAL | 0 refills | Status: DC

## 2023-08-31 NOTE — Assessment & Plan Note (Signed)
Resuming telmisartan/hct  for elevated readings to 180 systolic at home  he has increased dose to 80/25

## 2023-09-02 ENCOUNTER — Ambulatory Visit
Admission: RE | Admit: 2023-09-02 | Discharge: 2023-09-02 | Disposition: A | Discharge status: Home / Self Care | Payer: BC Managed Care – PPO | Source: Ambulatory Visit | Attending: Emergency Medicine | Admitting: Emergency Medicine

## 2023-09-02 VITALS — BP 106/70 | HR 76 | Temp 98.0°F | Resp 16

## 2023-09-02 DIAGNOSIS — L03032 Cellulitis of left toe: Secondary | ICD-10-CM | POA: Diagnosis not present

## 2023-09-02 MED ORDER — DOXYCYCLINE HYCLATE 100 MG PO CAPS: 100.0000 mg | ORAL_CAPSULE | Freq: Two times a day (BID) | ORAL | 0 refills | Status: DC

## 2023-09-02 NOTE — Discharge Instructions (Addendum)
Today you are being treated for an infection to the skin on your toe, does not appear to be a ingrown at this time but does look like the skin may have been nicked when the toenail was cut which allowed for the germ to enter as I can see dried blood on exam  Begin doxycycline every morning and every evening to clear infection, will take 48 hours to fully into her system but should see small improvement day by day  May use warm compresses or complete warm soaks over the affected area to help reduce swelling and for general comfort  May take Tylenol and or Motrin as needed for pain  May continue activity as tolerated  May follow-up with his urgent care as needed for any concerns regarding healing  Information for podiatry is listed on front page

## 2023-09-02 NOTE — ED Provider Notes (Signed)
Christopher Vazquez    CSN: [id number] Arrival date & time: 09/02/23  0803      History   Chief Complaint Chief Complaint  Patient presents with   Toe Pain    Middle toe left foot very red and painful. - Entered by patient    HPI Christopher Vazquez is a 57 y.o. male.   Patient presents for evaluation of pain and swelling to the third toe of the left foot present for 7 days.  Endorses has a history of fungus to the toenail and has been using ciclopirox on and off for quite a while, prescribed by PCP, attempted to restart but caused burning sensation to the toenail therefore discontinued.  Able to complete range of motion and bear weight.  Denies injury or trauma, numbness or tingling.  Has been clipping toenails at home.  Past Medical History:  Diagnosis Date   Cancer (HCC)    basal cell-nose   Complication of anesthesia    with tonsillectomy sever swelling of throat, possible laryngiospasm.   Diabetes mellitus without complication (HCC)    GERD (gastroesophageal reflux disease)    Hyperlipidemia    Hypertension     Patient Active Problem List   Diagnosis Date Noted   Right lower quadrant pain 07/22/2023   Long-term current use of injectable noninsulin antidiabetic medication 03/21/2023   Onychomycosis 12/09/2022   Strain of latissimus dorsi muscle 06/07/2022   Grief 03/16/2022   Insomnia 09/08/2021   Exertional dyspnea 11/02/2020   Esophageal dysphagia    Disorder of bursae of shoulder region 09/08/2020   Ectatic abdominal aorta (HCC) 09/04/2020   OSA (obstructive sleep apnea) 08/07/2020   Witnessed apneic spells 07/12/2020   Pain in joint of left shoulder 11/16/2019   Encounter for preventive health examination 10/23/2017   Obesity, diabetes, and hypertension syndrome (HCC) 08/18/2015   Vitamin D deficiency 02/19/2014   Seasonal allergic rhinitis 02/02/2014   Hepatic steatosis 03/07/2013   Right upper quadrant abdominal pain 02/06/2013   Hyperlipidemia     Essential hypertension     Past Surgical History:  Procedure Laterality Date   BIOPSY OF SKIN SUBCUTANEOUS TISSUE AND/OR MUCOUS MEMBRANE     nose   CHOLESTEATOMA EXCISION Left 2000   left ear   COLONOSCOPY WITH PROPOFOL N/A 11/17/2017   Procedure: COLONOSCOPY WITH PROPOFOL;  Surgeon: Toney Reil, MD;  Location: [facility] ENDOSCOPY;  Service: Gastroenterology;  Laterality: N/A;   ESOPHAGOGASTRODUODENOSCOPY (EGD) WITH PROPOFOL N/A 09/16/2020   Procedure: ESOPHAGOGASTRODUODENOSCOPY (EGD) WITH PROPOFOL;  Surgeon: Toney Reil, MD;  Location: [facility] ENDOSCOPY;  Service: Gastroenterology;  Laterality: N/A;   HERNIA REPAIR     KNEE ARTHROSCOPY WITH MEDIAL MENISECTOMY Left 07/01/2016   Procedure: left knee arthroscopy with partial medial menisectomy;  Surgeon: Kennedy Bucker, MD;  Location: [facility] ORS;  Service: Orthopedics;  Laterality: Left;   TONSILLECTOMY AND ADENOIDECTOMY     UMBILICAL HERNIA REPAIR N/A 08/20/2019   Procedure: LAPAROSCOPIC ASSISTED UMBILICAL HERNIA REPAIR WITH MESH;  Surgeon: Luretha Murphy, MD;  Location: [facility] ORS;  Service: General;  Laterality: N/A;       Home Medications    Prior to Admission medications   Medication Sig Start Date End Date Taking? Authorizing Provider  doxycycline (VIBRAMYCIN) 100 MG capsule Take 1 capsule (100 mg total) by mouth 2 (two) times daily. 09/02/23  Yes Cristiano Capri, Elita Boone, NP  ALPRAZolam (XANAX) 0.25 MG tablet Take 1 tablet (0.25 mg total) by mouth at bedtime as needed for anxiety. Patient not taking: Reported  on 08/26/2023 03/16/22   Sherlene Shams, MD  aspirin 81 MG tablet Take 81 mg by mouth at bedtime.    [provider]  atorvastatin (LIPITOR) 80 MG tablet Take 1 tablet (80 mg total) by mouth daily. 07/21/23 10/19/24  Sherlene Shams, MD  metFORMIN (GLUCOPHAGE) 1000 MG tablet Take 1 tablet (1,000 mg total) by mouth 2 (two) times daily with a meal. 08/28/23   Sherlene Shams, MD  montelukast (SINGULAIR) 10 MG tablet TAKE 1  TABLET AT BEDTIME 04/06/23   Sherlene Shams, MD  pantoprazole (PROTONIX) 40 MG tablet Take 1 tablet by mouth once daily 11/15/22   Sherlene Shams, MD  sildenafil (REVATIO) 20 MG tablet TAKE 1 TABLET BY MOUTH THREE TIMES DAILY 07/06/23   Sherlene Shams, MD  telmisartan-hydrochlorothiazide (MICARDIS HCT) 80-25 MG tablet Take 1 tablet by mouth daily. 08/31/23   Sherlene Shams, MD  traZODone (DESYREL) 100 MG tablet TAKE 1 TABLET AT BEDTIME ASNEEDED FOR SLEEP 07/21/23   Sherlene Shams, MD    Family History Family History  Problem Relation Age of Onset   Diabetes Mother    Hypertension Mother    Uterine cancer Mother    Aortic aneurysm Father 52   Heart disease Father 44   AAA (abdominal aortic aneurysm) Father    Kidney cancer Sister    Atrial fibrillation Son     Social History Social History   Tobacco Use   Smoking status: Never   Smokeless tobacco: Never  Vaping Use   Vaping status: Never Used  Substance Use Topics   Alcohol use: Not Currently   Drug use: No     Allergies   Niacin, Shellfish allergy, Scopolamine, and Niacin and related   Review of Systems Review of Systems   Physical Exam Triage Vital Signs ED Triage Vitals  Encounter Vitals Group     BP 09/02/23 0827 106/70     Systolic BP Percentile --      Diastolic BP Percentile --      Pulse Rate 09/02/23 0827 76     Resp 09/02/23 0827 16     Temp 09/02/23 0827 98 F (36.7 C)     Temp Source 09/02/23 0827 Oral     SpO2 09/02/23 0827 98 %     Weight --      Height --      Head Circumference --      Peak Flow --      Pain Score 09/02/23 0826 4     Pain Loc --      Pain Education --      Exclude from Growth Chart --    No data found.  Updated Vital Signs BP 106/70 (BP Location: Left Arm)   Pulse 76   Temp 98 F (36.7 C) (Oral)   Resp 16   SpO2 98%   Visual Acuity Right Eye Distance:   Left Eye Distance:   Bilateral Distance:    Right Eye Near:   Left Eye Near:    Bilateral Near:      Physical Exam Constitutional:      Appearance: Normal appearance.  Eyes:     Extraocular Movements: Extraocular movements intact.  Pulmonary:     Effort: Pulmonary effort is normal.  Feet:     Comments: Erythema and tenderness surrounding the distal proximal, medial and lateral aspect of the middle left toenail nailbed, no drainage noted, toenail is cut very short and able to see dried  blood overlying the lateral aspect of the nailbed, no drainage noted Neurological:     Mental Status: He is alert and oriented to person, place, and time. Mental status is at baseline.      UC Treatments / Results  Labs (all labs ordered are listed, but only abnormal results are displayed) Labs Reviewed - No data to display  EKG   Radiology No results found.  Procedures Procedures (including critical care time)  Medications Ordered in UC Medications - No data to display  Initial Impression / Assessment and Plan / UC Course  I have reviewed the triage vital signs and the nursing notes.  Pertinent labs & imaging results that were available during my care of the patient were reviewed by me and considered in my medical decision making (see chart for details).  Paronychia the third toe of left foot  Presentation consistent with infection, discussed with patient, doxycycline prescribed, recommended warm compresses or soaks, Tylenol and Motrin and elevation for supportive care, given referral to podiatry.  Further evaluation and management if symptoms continue to persist or worsen Final Clinical Impressions(s) / UC Diagnoses   Final diagnoses:  Paronychia of third toe of left foot     Discharge Instructions      Today you are being treated for an infection to the skin on your toe, does not appear to be a ingrown at this time but does look like the skin may have been nicked when the toenail was cut which allowed for the germ to enter as I can see dried blood on exam  Begin doxycycline  every morning and every evening to clear infection, will take 48 hours to fully into her system but should see small improvement day by day  May use warm compresses or complete warm soaks over the affected area to help reduce swelling and for general comfort  May take Tylenol and or Motrin as needed for pain  May continue activity as tolerated  May follow-up with his urgent care as needed for any concerns regarding healing  Information for podiatry is listed on front page   ED Prescriptions     Medication Sig Dispense Auth. Provider   doxycycline (VIBRAMYCIN) 100 MG capsule Take 1 capsule (100 mg total) by mouth 2 (two) times daily. 14 capsule Norma Ignasiak, Elita Boone, NP      PDMP not reviewed this encounter.   Valinda Hoar, [place] 09/02/23 [phone]

## 2023-09-02 NOTE — ED Triage Notes (Signed)
Pt c/o left third toe pain and swelling x 3 days.

## 2023-09-07 ENCOUNTER — Other Ambulatory Visit: Payer: Self-pay | Admitting: Internal Medicine

## 2023-09-07 NOTE — Telephone Encounter (Signed)
Metformin 500 mg has been discontinued. Pt is currently taking Metformin 1000 mg. Is it okay to refuse?

## 2023-09-11 ENCOUNTER — Other Ambulatory Visit: Payer: Self-pay | Admitting: Internal Medicine

## 2023-09-20 ENCOUNTER — Encounter: Payer: Self-pay | Admitting: Internal Medicine

## 2023-09-21 ENCOUNTER — Ambulatory Visit: Payer: Self-pay | Admitting: Podiatry

## 2023-09-24 ENCOUNTER — Other Ambulatory Visit: Payer: Self-pay | Admitting: Internal Medicine

## 2023-10-20 ENCOUNTER — Ambulatory Visit: Payer: BC Managed Care – PPO | Admitting: Internal Medicine

## 2023-10-20 ENCOUNTER — Encounter: Payer: Self-pay | Admitting: Internal Medicine

## 2023-10-20 ENCOUNTER — Ambulatory Visit (INDEPENDENT_AMBULATORY_CARE_PROVIDER_SITE_OTHER): Payer: BC Managed Care – PPO | Admitting: Internal Medicine

## 2023-10-20 VITALS — BP 112/66 | HR 85 | Ht 68.0 in | Wt 168.2 lb

## 2023-10-20 DIAGNOSIS — E782 Mixed hyperlipidemia: Secondary | ICD-10-CM

## 2023-10-20 DIAGNOSIS — R1011 Right upper quadrant pain: Secondary | ICD-10-CM

## 2023-10-20 DIAGNOSIS — T50905A Adverse effect of unspecified drugs, medicaments and biological substances, initial encounter: Secondary | ICD-10-CM | POA: Diagnosis not present

## 2023-10-20 DIAGNOSIS — E1169 Type 2 diabetes mellitus with other specified complication: Secondary | ICD-10-CM | POA: Diagnosis not present

## 2023-10-20 DIAGNOSIS — E1159 Type 2 diabetes mellitus with other circulatory complications: Secondary | ICD-10-CM

## 2023-10-20 DIAGNOSIS — K861 Other chronic pancreatitis: Secondary | ICD-10-CM | POA: Diagnosis not present

## 2023-10-20 DIAGNOSIS — Z7984 Long term (current) use of oral hypoglycemic drugs: Secondary | ICD-10-CM

## 2023-10-20 DIAGNOSIS — I152 Hypertension secondary to endocrine disorders: Secondary | ICD-10-CM | POA: Diagnosis not present

## 2023-10-20 DIAGNOSIS — E669 Obesity, unspecified: Secondary | ICD-10-CM

## 2023-10-20 LAB — COMPREHENSIVE METABOLIC PANEL
ALT: 15 U/L (ref 0–53)
AST: 18 U/L (ref 0–37)
Albumin: 4.3 g/dL (ref 3.5–5.2)
Alkaline Phosphatase: 36 U/L — ABNORMAL LOW (ref 39–117)
BUN: 18 mg/dL (ref 6–23)
CO2: 30 meq/L (ref 19–32)
Calcium: 9.4 mg/dL (ref 8.4–10.5)
Chloride: 105 meq/L (ref 96–112)
Creatinine, Ser: 1.04 mg/dL (ref 0.40–1.50)
GFR: 80.22 mL/min (ref >60.00)
Glucose, Bld: 80 mg/dL (ref 70–99)
Potassium: 4.3 meq/L (ref 3.5–5.1)
Sodium: 142 meq/L (ref 135–145)
Total Bilirubin: 0.5 mg/dL (ref 0.2–1.2)
Total Protein: 6.6 g/dL (ref 6.0–8.3)

## 2023-10-20 LAB — LIPID PANEL
Cholesterol: 101 mg/dL (ref 0–200)
HDL: 34.7 mg/dL — ABNORMAL LOW (ref >39.00)
LDL Cholesterol: 51 mg/dL (ref 0–99)
NonHDL: 66.7
Total CHOL/HDL Ratio: 3
Triglycerides: 77 mg/dL (ref 0.0–149.0)
VLDL: 15.4 mg/dL (ref 0.0–40.0)

## 2023-10-20 LAB — HEMOGLOBIN A1C: Hgb A1c MFr Bld: 5.7 % (ref 4.6–6.5)

## 2023-10-20 LAB — LDL CHOLESTEROL, DIRECT: Direct LDL: 60 mg/dL

## 2023-10-20 LAB — LIPASE: Lipase: 78 U/L — ABNORMAL HIGH (ref 11.0–59.0)

## 2023-10-20 MED ORDER — TRAZODONE HCL 100 MG PO TABS: ORAL_TABLET | ORAL | 3 refills | Status: DC

## 2023-10-20 MED ORDER — TELMISARTAN-HCTZ 80-25 MG PO TABS: 1.0000 | ORAL_TABLET | Freq: Every day | ORAL | 3 refills | Status: DC

## 2023-10-20 MED ORDER — METFORMIN HCL 1000 MG PO TABS: 1000.0000 mg | ORAL_TABLET | Freq: Two times a day (BID) | ORAL | 3 refills | Status: DC

## 2023-10-20 MED ORDER — PANTOPRAZOLE SODIUM 40 MG PO TBEC: 40.0000 mg | DELAYED_RELEASE_TABLET | Freq: Every day | ORAL | 3 refills | Status: DC

## 2023-10-20 NOTE — Patient Instructions (Addendum)
You are doing great~  no changes today  If your appetite needs suppression , you may get some assistance from fiber.  Add a second gummy bear snack and take with 16 ounce of water 30 minutes before lunch     Health[employer] Choice "low carb power bowl"  entrees and  "Steamer" entrees are are great low carb entrees that microwave in 5 minutes       FITKITCHEN cauliflower pizza bowls are great low carb entrees that microwave in 5 minutes

## 2023-10-20 NOTE — Progress Notes (Signed)
Subjective:  Patient ID: Christopher Vazquez, male    DOB: 1966-03-10  Age: 57 y.o. MRN: [id number]  CC: The primary encounter diagnosis was Obesity, diabetes, and hypertension syndrome (HCC). Diagnoses of Mixed hyperlipidemia, Drug-induced chronic pancreatitis (HCC), and Right upper quadrant abdominal pain were also pertinent to this visit.   HPI Christopher Vazquez presents for  Chief Complaint  Patient presents with   Medical Management of Chronic Issues    3 month follow up on diabetes   1)  Type 2 DM with OBESITY:  HAS GAINED 7 LBS SINCE September  when he d/c'd [facility]   but weight has plateaued   Greggory Keen was d/c'd due to ELEVATED LIPASE/PANCREATITIS.  ABD PAIN HAS RESOLVED  JUST TAKING METFORMIN 1000 MG BID .  He developed  uncontrolled hypertension and headaches after stopping mounjaro.  To 160/110  increased  his bp medication   over several days and fnally reache a normal reading with (telmisartan/hct  80/25) .  Denies food cravingd  but has had some weight gain bt has plateaeued    Outpatient Medications Prior to Visit  Medication Sig Dispense Refill   aspirin 81 MG tablet Take 81 mg by mouth at bedtime.     atorvastatin (LIPITOR) 80 MG tablet Take 1 tablet (80 mg total) by mouth daily. 90 tablet 5   fenofibrate 160 MG tablet Take 160 mg by mouth daily.     montelukast (SINGULAIR) 10 MG tablet TAKE 1 TABLET AT BEDTIME 90 tablet 3   sildenafil (REVATIO) 20 MG tablet TAKE 1 TABLET BY MOUTH THREE TIMES DAILY 90 tablet 5   metFORMIN (GLUCOPHAGE) 1000 MG tablet Take 1 tablet (1,000 mg total) by mouth 2 (two) times daily with a meal. 180 tablet 0   pantoprazole (PROTONIX) 40 MG tablet TAKE ONE TABLET BY MOUTH ONE TIME DAILY 90 tablet 0   telmisartan-hydrochlorothiazide (MICARDIS HCT) 80-25 MG tablet Take 1 tablet by mouth daily. 90 tablet 0   traZODone (DESYREL) 100 MG tablet TAKE 1 TABLET AT BEDTIME ASNEEDED FOR SLEEP 810 tablet 0   ALPRAZolam (XANAX) 0.25 MG tablet Take 1 tablet (0.25  mg total) by mouth at bedtime as needed for anxiety. (Patient not taking: Reported on 10/20/2023) 30 tablet 0   doxycycline (VIBRAMYCIN) 100 MG capsule Take 1 capsule (100 mg total) by mouth 2 (two) times daily. (Patient not taking: Reported on 10/20/2023) 14 capsule 0   No facility-administered medications prior to visit.    Review of Systems;  Patient denies headache, fevers, malaise, unintentional weight loss, skin rash, eye pain, sinus congestion and sinus pain, sore throat, dysphagia,  hemoptysis , cough, dyspnea, wheezing, chest pain, palpitations, orthopnea, edema, abdominal pain, nausea, melena, diarrhea, constipation, flank pain, dysuria, hematuria, urinary  Frequency, nocturia, numbness, tingling, seizures,  Focal weakness, Loss of consciousness,  Tremor, insomnia, depression, anxiety, and suicidal ideation.      Objective:  BP 112/66   Pulse 85   Ht 5\' 8"  (1.727 m)   Wt 168 lb 3.2 oz (76.3 kg)   SpO2 98%   BMI 25.57 kg/m   BP Readings from Last 3 Encounters:  10/20/23 112/66  09/02/23 106/70  08/26/23 124/70    Wt Readings from Last 3 Encounters:  10/20/23 168 lb 3.2 oz (76.3 kg)  08/26/23 164 lb 9.6 oz (74.7 kg)  07/21/23 161 lb 6.4 oz (73.2 kg)    Physical Exam Vitals reviewed.  Constitutional:      General: He is not in acute distress.  Appearance: Normal appearance. He is normal weight. He is not ill-appearing, toxic-appearing or diaphoretic.  HENT:     Head: Normocephalic.  Eyes:     General: No scleral icterus.       Right eye: No discharge.        Left eye: No discharge.     Conjunctiva/sclera: Conjunctivae normal.  Cardiovascular:     Rate and Rhythm: Normal rate and regular rhythm.     Heart sounds: Normal heart sounds.  Pulmonary:     Effort: Pulmonary effort is normal. No respiratory distress.     Breath sounds: Normal breath sounds.  Musculoskeletal:        General: Normal range of motion.     Cervical back: Normal range of motion.   Skin:    General: Skin is warm and dry.  Neurological:     General: No focal deficit present.     Mental Status: He is alert and oriented to person, place, and time. Mental status is at baseline.  Psychiatric:        Mood and Affect: Mood normal.        Behavior: Behavior normal.        Thought Content: Thought content normal.        Judgment: Judgment normal.    Lab Results  Component Value Date   HGBA1C 5.7 10/20/2023   HGBA1C 5.3 07/21/2023   HGBA1C 5.8 03/15/2023    Lab Results  Component Value Date   CREATININE 1.04 10/20/2023   CREATININE 1.04 08/26/2023   CREATININE 1.03 07/21/2023    Lab Results  Component Value Date   WBC 5.1 12/08/2022   HGB 14.7 12/08/2022   HCT 42.1 12/08/2022   PLT 236.0 12/08/2022   GLUCOSE 80 10/20/2023   CHOL 101 10/20/2023   TRIG 77.0 10/20/2023   HDL 34.70 (L) 10/20/2023   LDLDIRECT 60.0 10/20/2023   LDLCALC 51 10/20/2023   ALT 15 10/20/2023   AST 18 10/20/2023   NA 142 10/20/2023   K 4.3 10/20/2023   CL 105 10/20/2023   CREATININE 1.04 10/20/2023   BUN 18 10/20/2023   CO2 30 10/20/2023   TSH 2.27 12/08/2022   PSA 0.68 12/08/2022   HGBA1C 5.7 10/20/2023   MICROALBUR 2.1 (H) 03/15/2023    No results found.  Assessment & Plan:  .Obesity, diabetes, and hypertension syndrome (HCC) Assessment & Plan: Excellent control despite stopping  Mounjaro due to pancreatitis.  He hs lost 60 lbs  and is currently tolerating metformin 1000 mg bid . He has resumed telmisartan/hct and BP is now well controlled on 80/25 dose   Lab Results  Component Value Date   HGBA1C 5.7 10/20/2023   Lab Results  Component Value Date   MICROALBUR 2.1 (H) 03/15/2023   MICROALBUR <0.7 03/16/2022       Orders: -     Hemoglobin A1c -     Comprehensive metabolic panel  Mixed hyperlipidemia Assessment & Plan: LDL and triglycerides are at goal on fenofibrate and simvastatin. He has no side effects and liver enzymes are normal. No changes  today   Lab Results  Component Value Date   CHOL 101 10/20/2023   HDL 34.70 (L) 10/20/2023   LDLCALC 51 10/20/2023   LDLDIRECT 60.0 10/20/2023   TRIG 77.0 10/20/2023   CHOLHDL 3 10/20/2023   Lab Results  Component Value Date   ALT 15 10/20/2023   AST 18 10/20/2023   ALKPHOS 36 (L) 10/20/2023   BILITOT 0.5 10/20/2023  Orders: -     Lipid panel -     LDL cholesterol, direct  Drug-induced chronic pancreatitis (HCC) -     Lipase  Right upper quadrant abdominal pain Assessment & Plan: Secondary to mild pancreatitis. Symptoms have resolved and lipase has improved.  Greggory Keen has been discontinued.   Lab Results  Component Value Date   ALT 15 10/20/2023   AST 18 10/20/2023   ALKPHOS 36 (L) 10/20/2023   BILITOT 0.5 10/20/2023   Lab Results  Component Value Date   LIPASE 78.0 (H) 10/20/2023      Other orders -     metFORMIN HCl; Take 1 tablet (1,000 mg total) by mouth 2 (two) times daily with a meal.  Dispense: 180 tablet; Refill: 3 -     Pantoprazole Sodium; Take 1 tablet (40 mg total) by mouth daily.  Dispense: 90 tablet; Refill: 3 -     Telmisartan-HCTZ; Take 1 tablet by mouth daily.  Dispense: 90 tablet; Refill: 3 -     traZODone HCl; TAKE 1 TABLET AT BEDTIME ASNEEDED FOR SLEEP  Dispense: 810 tablet; Refill: 3     Follow-up: Return in about 3 months (around 01/18/2024).   Sherlene Shams, MD

## 2023-10-22 NOTE — Assessment & Plan Note (Addendum)
Excellent control despite stopping  Mounjaro due to pancreatitis.  He hs lost 60 lbs  and is currently tolerating metformin 1000 mg bid . He has resumed telmisartan/hct and BP is now well controlled on 80/25 dose   Lab Results  Component Value Date   HGBA1C 5.7 10/20/2023   Lab Results  Component Value Date   MICROALBUR 2.1 (H) 03/15/2023   MICROALBUR <0.7 03/16/2022

## 2023-10-22 NOTE — Assessment & Plan Note (Signed)
LDL and triglycerides are at goal on fenofibrate and simvastatin. He has no side effects and liver enzymes are normal. No changes today   Lab Results  Component Value Date   CHOL 101 10/20/2023   HDL 34.70 (L) 10/20/2023   LDLCALC 51 10/20/2023   LDLDIRECT 60.0 10/20/2023   TRIG 77.0 10/20/2023   CHOLHDL 3 10/20/2023   Lab Results  Component Value Date   ALT 15 10/20/2023   AST 18 10/20/2023   ALKPHOS 36 (L) 10/20/2023   BILITOT 0.5 10/20/2023

## 2023-10-22 NOTE — Assessment & Plan Note (Signed)
Secondary to mild pancreatitis. Symptoms have resolved and lipase has improved.  Greggory Keen has been discontinued.   Lab Results  Component Value Date   ALT 15 10/20/2023   AST 18 10/20/2023   ALKPHOS 36 (L) 10/20/2023   BILITOT 0.5 10/20/2023   Lab Results  Component Value Date   LIPASE 78.0 (H) 10/20/2023

## 2023-12-06 ENCOUNTER — Other Ambulatory Visit: Payer: Self-pay

## 2023-12-06 MED ORDER — METFORMIN HCL 1000 MG PO TABS: 1000.0000 mg | ORAL_TABLET | Freq: Two times a day (BID) | ORAL | 0 refills | Status: DC

## 2024-02-28 ENCOUNTER — Other Ambulatory Visit: Payer: Self-pay | Admitting: Internal Medicine

## 2024-03-11 ENCOUNTER — Other Ambulatory Visit: Payer: Self-pay | Admitting: Internal Medicine

## 2024-03-13 MED ORDER — FENOFIBRATE 160 MG PO TABS: 160.0000 mg | ORAL_TABLET | Freq: Every day | ORAL | 1 refills | Status: DC

## 2024-03-16 ENCOUNTER — Ambulatory Visit (INDEPENDENT_AMBULATORY_CARE_PROVIDER_SITE_OTHER): Payer: Self-pay | Admitting: Podiatry

## 2024-03-16 DIAGNOSIS — B351 Tinea unguium: Secondary | ICD-10-CM | POA: Diagnosis not present

## 2024-03-16 MED ORDER — TERBINAFINE HCL 250 MG PO TABS: 250.0000 mg | ORAL_TABLET | Freq: Every day | ORAL | 0 refills | Status: DC

## 2024-03-16 NOTE — Progress Notes (Signed)
 Chief Complaint  Patient presents with   Diabetes    "I stumped my big toenails a few years ago.  My toenails grew back thick." N - toenails L - hallux bilateral D - 1 year O - gradually worse C - thick, brittle, discolored A - none T - Dr. Madelon Scheuermann had me taking     Subjective: 58 y.o. male presenting today as a new patient for evaluation of thickening and discoloration to the toenails bilateral.  Concern for possible toenail fungus.  He has tried topical medication with minimal improvement.  Presenting for further treatment evaluation  Past Medical History:  Diagnosis Date   Cancer (HCC)    basal cell-nose   Complication of anesthesia    with tonsillectomy sever swelling of throat, possible laryngiospasm.   Diabetes mellitus without complication (HCC)    GERD (gastroesophageal reflux disease)    Hyperlipidemia    Hypertension     Past Surgical History:  Procedure Laterality Date   BIOPSY OF SKIN SUBCUTANEOUS TISSUE AND/OR MUCOUS MEMBRANE     nose   CHOLESTEATOMA EXCISION Left 2000   left ear   COLONOSCOPY WITH PROPOFOL  N/A 11/17/2017   Procedure: COLONOSCOPY WITH PROPOFOL ;  Surgeon: Selena Daily, MD;  Location: [facility] ENDOSCOPY;  Service: Gastroenterology;  Laterality: N/A;   ESOPHAGOGASTRODUODENOSCOPY (EGD) WITH PROPOFOL  N/A 09/16/2020   Procedure: ESOPHAGOGASTRODUODENOSCOPY (EGD) WITH PROPOFOL ;  Surgeon: Selena Daily, MD;  Location: [facility] ENDOSCOPY;  Service: Gastroenterology;  Laterality: N/A;   HERNIA REPAIR     KNEE ARTHROSCOPY WITH MEDIAL MENISECTOMY Left 07/01/2016   Procedure: left knee arthroscopy with partial medial menisectomy;  Surgeon: Molli Angelucci, MD;  Location: [facility] ORS;  Service: Orthopedics;  Laterality: Left;   TONSILLECTOMY AND ADENOIDECTOMY     UMBILICAL HERNIA REPAIR N/A 08/20/2019   Procedure: LAPAROSCOPIC ASSISTED UMBILICAL HERNIA REPAIR WITH MESH;  Surgeon: Jacolyn Matar, MD;  Location: [facility] ORS;  Service: General;  Laterality: N/A;     Allergies  Allergen Reactions   Mounjaro  [Tirzepatide ] Other (See Comments)    PANCREATITIS   Niacin Hives, Itching and Rash    Pins and needles.   Shellfish Allergy Anaphylaxis and Itching    throat starts to become scratchy, denies difficulty breathing or swelling of mouth, lips or throat. Denies problems with betadine topically. Other reaction(s): Other (See Comments) Dryness and tightness in  throat throat starts to become scratchy, denies difficulty breathing or swelling of mouth, lips or throat. Denies problems with betadine topically.   Scopolamine Other (See Comments)    vision Other reaction(s): Other (See Comments), Other (See Comments) vision vision   Niacin And Related Rash    Pens and needle sensation    03/16/2024  Objective: Physical Exam General: The patient is alert and oriented x3 in no acute distress.  Dermatology: Hyperkeratotic, discolored, thickened, onychodystrophy noted. Skin is warm, dry and supple bilateral lower extremities. Negative for open lesions or macerations.  Vascular: Palpable pedal pulses bilaterally. No edema or erythema noted. Capillary refill within normal limits.  Neurological: Grossly intact via light touch  Musculoskeletal Exam: No pedal deformity noted  Assessment: #1 Onychomycosis of toenails bilateral  Plan of Care:  #1 Patient was evaluated. #2  Today we discussed different treatment options including oral, topical, and laser antifungal treatment modalities.  We discussed their efficacies and side effects.  Patient opts for oral antifungal treatment modality #3 prescription for Lamisil 250 mg #90 daily. Pt denies a history of liver pathology or symptoms.  CMP 10/20/2023 hepatic function  WNL #4 return to clinic 6 months   Dot Gazella, DPM [facility]  Dr. Dot Gazella, DPM    [address] [place], [place] [address]                Office [phone]  Fax  [phone]

## 2024-03-28 ENCOUNTER — Other Ambulatory Visit: Payer: Self-pay | Admitting: Internal Medicine

## 2024-03-30 ENCOUNTER — Ambulatory Visit

## 2024-04-05 ENCOUNTER — Other Ambulatory Visit: Payer: Self-pay | Admitting: Internal Medicine

## 2024-04-24 ENCOUNTER — Other Ambulatory Visit: Payer: Self-pay | Admitting: Internal Medicine

## 2024-04-28 ENCOUNTER — Encounter: Payer: Self-pay | Admitting: Internal Medicine

## 2024-04-28 DIAGNOSIS — D649 Anemia, unspecified: Secondary | ICD-10-CM

## 2024-04-28 DIAGNOSIS — E669 Obesity, unspecified: Secondary | ICD-10-CM

## 2024-04-28 DIAGNOSIS — Z125 Encounter for screening for malignant neoplasm of prostate: Secondary | ICD-10-CM

## 2024-04-28 DIAGNOSIS — E782 Mixed hyperlipidemia: Secondary | ICD-10-CM

## 2024-04-28 DIAGNOSIS — Z79899 Other long term (current) drug therapy: Secondary | ICD-10-CM

## 2024-04-30 ENCOUNTER — Telehealth: Payer: Self-pay | Admitting: Internal Medicine

## 2024-04-30 MED ORDER — SILDENAFIL CITRATE 20 MG PO TABS: ORAL_TABLET | ORAL | 0 refills | Status: DC

## 2024-04-30 MED ORDER — PANTOPRAZOLE SODIUM 40 MG PO TBEC: 40.0000 mg | DELAYED_RELEASE_TABLET | Freq: Every day | ORAL | 0 refills | Status: DC

## 2024-04-30 NOTE — Telephone Encounter (Signed)
 I have pended the labs for your approval. If needed I will get pt scheduled for a lab appt.

## 2024-04-30 NOTE — Addendum Note (Signed)
 Addended by: HARRIETTE RAISIN on: 04/30/2024 02:20 PM   Modules accepted: Orders

## 2024-04-30 NOTE — Telephone Encounter (Signed)
 Prescription Request  04/30/2024  LOV: 10/20/2023  What is the name of the medication or equipment?  pantoprazole  (PROTONIX ) 40 MG tablet, sildenafil  (REVATIO ) 20 MG tablet   Have you contacted your pharmacy to request a refill? Yes    [facility] [address] [facility] - [place], [place] - [address] AT [facility] [address] [place] [address] Phone: [phone] Fax: [phone]    Patient notified that their request is being sent to the clinical staff for review and that they should receive a response within 2 business days.   Please advise at Mobile [phone] (mobile)

## 2024-04-30 NOTE — Telephone Encounter (Signed)
 Medications refilled

## 2024-05-02 ENCOUNTER — Other Ambulatory Visit (INDEPENDENT_AMBULATORY_CARE_PROVIDER_SITE_OTHER)

## 2024-05-02 DIAGNOSIS — E669 Obesity, unspecified: Secondary | ICD-10-CM | POA: Diagnosis not present

## 2024-05-02 DIAGNOSIS — E782 Mixed hyperlipidemia: Secondary | ICD-10-CM | POA: Diagnosis not present

## 2024-05-02 DIAGNOSIS — E1159 Type 2 diabetes mellitus with other circulatory complications: Secondary | ICD-10-CM | POA: Diagnosis not present

## 2024-05-02 DIAGNOSIS — E1169 Type 2 diabetes mellitus with other specified complication: Secondary | ICD-10-CM

## 2024-05-02 DIAGNOSIS — Z125 Encounter for screening for malignant neoplasm of prostate: Secondary | ICD-10-CM | POA: Diagnosis not present

## 2024-05-02 DIAGNOSIS — I152 Hypertension secondary to endocrine disorders: Secondary | ICD-10-CM

## 2024-05-02 DIAGNOSIS — Z7985 Long-term (current) use of injectable non-insulin antidiabetic drugs: Secondary | ICD-10-CM

## 2024-05-02 DIAGNOSIS — Z7984 Long term (current) use of oral hypoglycemic drugs: Secondary | ICD-10-CM

## 2024-05-02 DIAGNOSIS — Z79899 Other long term (current) drug therapy: Secondary | ICD-10-CM

## 2024-05-02 LAB — COMPREHENSIVE METABOLIC PANEL WITH GFR
ALT: 18 U/L (ref 0–53)
AST: 22 U/L (ref 0–37)
Albumin: 4.3 g/dL (ref 3.5–5.2)
Alkaline Phosphatase: 25 U/L — ABNORMAL LOW (ref 39–117)
BUN: 18 mg/dL (ref 6–23)
CO2: 31 meq/L (ref 19–32)
Calcium: 9.6 mg/dL (ref 8.4–10.5)
Chloride: 103 meq/L (ref 96–112)
Creatinine, Ser: 1.21 mg/dL (ref 0.40–1.50)
GFR: 66.65 mL/min (ref >60.00)
Glucose, Bld: 90 mg/dL (ref 70–99)
Potassium: 4.6 meq/L (ref 3.5–5.1)
Sodium: 141 meq/L (ref 135–145)
Total Bilirubin: 0.6 mg/dL (ref 0.2–1.2)
Total Protein: 6.5 g/dL (ref 6.0–8.3)

## 2024-05-02 LAB — LIPID PANEL
Cholesterol: 100 mg/dL (ref 0–200)
HDL: 31 mg/dL — ABNORMAL LOW (ref >39.00)
LDL Cholesterol: 51 mg/dL (ref 0–99)
NonHDL: 69.07
Total CHOL/HDL Ratio: 3
Triglycerides: 89 mg/dL (ref 0.0–149.0)
VLDL: 17.8 mg/dL (ref 0.0–40.0)

## 2024-05-02 LAB — TSH: TSH: 1.49 u[IU]/mL (ref 0.35–5.50)

## 2024-05-02 LAB — CBC WITH DIFFERENTIAL/PLATELET
Basophils Absolute: 0 10*3/uL (ref 0.0–0.1)
Basophils Relative: 0.4 % (ref 0.0–3.0)
Eosinophils Absolute: 0.1 10*3/uL (ref 0.0–0.7)
Eosinophils Relative: 1.9 % (ref 0.0–5.0)
HCT: 34.3 % — ABNORMAL LOW (ref 39.0–52.0)
Hemoglobin: 11.7 g/dL — ABNORMAL LOW (ref 13.0–17.0)
Lymphocytes Relative: 24.9 % (ref 12.0–46.0)
Lymphs Abs: 1.4 10*3/uL (ref 0.7–4.0)
MCHC: 34 g/dL (ref 30.0–36.0)
MCV: 90.9 fl (ref 78.0–100.0)
Monocytes Absolute: 0.5 10*3/uL (ref 0.1–1.0)
Monocytes Relative: 8.5 % (ref 3.0–12.0)
Neutro Abs: 3.7 10*3/uL (ref 1.4–7.7)
Neutrophils Relative %: 64.3 % (ref 43.0–77.0)
Platelets: 249 10*3/uL (ref 150.0–400.0)
RBC: 3.77 Mil/uL — ABNORMAL LOW (ref 4.22–5.81)
RDW: 14.8 % (ref 11.5–15.5)
WBC: 5.7 10*3/uL (ref 4.0–10.5)

## 2024-05-02 LAB — PSA: PSA: 0.69 ng/mL (ref 0.10–4.00)

## 2024-05-02 LAB — LDL CHOLESTEROL, DIRECT: Direct LDL: 56 mg/dL

## 2024-05-02 LAB — MICROALBUMIN / CREATININE URINE RATIO
Creatinine,U: 118 mg/dL
Microalb Creat Ratio: 12.4 mg/g (ref 0.0–30.0)
Microalb, Ur: 1.5 mg/dL (ref 0.0–1.9)

## 2024-05-02 LAB — HEMOGLOBIN A1C: Hgb A1c MFr Bld: 5.6 % (ref 4.6–6.5)

## 2024-05-03 ENCOUNTER — Encounter: Payer: Self-pay | Admitting: Internal Medicine

## 2024-05-04 ENCOUNTER — Ambulatory Visit: Payer: Self-pay | Admitting: Internal Medicine

## 2024-05-04 NOTE — Addendum Note (Signed)
 Addended by: MARYLYNN VERNEITA CROME on: 05/04/2024 10:27 AM   Modules accepted: Orders

## 2024-05-14 ENCOUNTER — Other Ambulatory Visit (INDEPENDENT_AMBULATORY_CARE_PROVIDER_SITE_OTHER)

## 2024-05-14 DIAGNOSIS — D649 Anemia, unspecified: Secondary | ICD-10-CM | POA: Diagnosis not present

## 2024-05-14 LAB — CBC WITH DIFFERENTIAL/PLATELET
Basophils Absolute: 0 K/uL (ref 0.0–0.1)
Basophils Relative: 0.7 % (ref 0.0–3.0)
Eosinophils Absolute: 0.1 K/uL (ref 0.0–0.7)
Eosinophils Relative: 2.1 % (ref 0.0–5.0)
HCT: 34.1 % — ABNORMAL LOW (ref 39.0–52.0)
Hemoglobin: 11.6 g/dL — ABNORMAL LOW (ref 13.0–17.0)
Lymphocytes Relative: 32.8 % (ref 12.0–46.0)
Lymphs Abs: 1.5 K/uL (ref 0.7–4.0)
MCHC: 34 g/dL (ref 30.0–36.0)
MCV: 90.5 fl (ref 78.0–100.0)
Monocytes Absolute: 0.4 K/uL (ref 0.1–1.0)
Monocytes Relative: 9.7 % (ref 3.0–12.0)
Neutro Abs: 2.5 K/uL (ref 1.4–7.7)
Neutrophils Relative %: 54.7 % (ref 43.0–77.0)
Platelets: 256 K/uL (ref 150.0–400.0)
RBC: 3.77 Mil/uL — ABNORMAL LOW (ref 4.22–5.81)
RDW: 15 % (ref 11.5–15.5)
WBC: 4.5 K/uL (ref 4.0–10.5)

## 2024-05-14 LAB — IBC + FERRITIN
Ferritin: 169.6 ng/mL (ref 22.0–322.0)
Iron: 73 ug/dL (ref 42–165)
Saturation Ratios: 21 % (ref 20.0–50.0)
TIBC: 347.2 ug/dL (ref 250.0–450.0)
Transferrin: 248 mg/dL (ref 212.0–360.0)

## 2024-05-14 LAB — VITAMIN B12: Vitamin B-12: 317 pg/mL (ref 211–911)

## 2024-05-17 LAB — FOLATE RBC: RBC Folate: 436 ng/mL (ref >280)

## 2024-05-18 ENCOUNTER — Other Ambulatory Visit (INDEPENDENT_AMBULATORY_CARE_PROVIDER_SITE_OTHER)

## 2024-05-18 DIAGNOSIS — D649 Anemia, unspecified: Secondary | ICD-10-CM

## 2024-05-18 LAB — FECAL OCCULT BLOOD, IMMUNOCHEMICAL: Fecal Occult Bld: NEGATIVE

## 2024-05-21 ENCOUNTER — Other Ambulatory Visit: Payer: Self-pay

## 2024-05-21 DIAGNOSIS — D649 Anemia, unspecified: Secondary | ICD-10-CM

## 2024-05-22 ENCOUNTER — Encounter: Payer: Self-pay | Admitting: Internal Medicine

## 2024-05-26 ENCOUNTER — Other Ambulatory Visit: Payer: Self-pay | Admitting: Internal Medicine

## 2024-06-11 ENCOUNTER — Inpatient Hospital Stay: Attending: Oncology | Admitting: Oncology

## 2024-06-11 ENCOUNTER — Inpatient Hospital Stay

## 2024-06-11 ENCOUNTER — Other Ambulatory Visit: Payer: Self-pay | Admitting: Internal Medicine

## 2024-06-11 ENCOUNTER — Encounter: Payer: Self-pay | Admitting: Oncology

## 2024-06-11 VITALS — BP 126/78 | HR 65 | Temp 98.1°F | Resp 18 | Wt 177.6 lb

## 2024-06-11 DIAGNOSIS — Z9089 Acquired absence of other organs: Secondary | ICD-10-CM | POA: Insufficient documentation

## 2024-06-11 DIAGNOSIS — K219 Gastro-esophageal reflux disease without esophagitis: Secondary | ICD-10-CM | POA: Diagnosis not present

## 2024-06-11 DIAGNOSIS — Z8051 Family history of malignant neoplasm of kidney: Secondary | ICD-10-CM | POA: Diagnosis not present

## 2024-06-11 DIAGNOSIS — D529 Folate deficiency anemia, unspecified: Secondary | ICD-10-CM | POA: Insufficient documentation

## 2024-06-11 DIAGNOSIS — Z7985 Long-term (current) use of injectable non-insulin antidiabetic drugs: Secondary | ICD-10-CM | POA: Insufficient documentation

## 2024-06-11 DIAGNOSIS — R5383 Other fatigue: Secondary | ICD-10-CM | POA: Diagnosis not present

## 2024-06-11 DIAGNOSIS — Z833 Family history of diabetes mellitus: Secondary | ICD-10-CM | POA: Diagnosis not present

## 2024-06-11 DIAGNOSIS — E119 Type 2 diabetes mellitus without complications: Secondary | ICD-10-CM | POA: Diagnosis not present

## 2024-06-11 DIAGNOSIS — Z7982 Long term (current) use of aspirin: Secondary | ICD-10-CM | POA: Diagnosis not present

## 2024-06-11 DIAGNOSIS — E785 Hyperlipidemia, unspecified: Secondary | ICD-10-CM | POA: Diagnosis not present

## 2024-06-11 DIAGNOSIS — I10 Essential (primary) hypertension: Secondary | ICD-10-CM | POA: Insufficient documentation

## 2024-06-11 DIAGNOSIS — D649 Anemia, unspecified: Secondary | ICD-10-CM | POA: Insufficient documentation

## 2024-06-11 DIAGNOSIS — Z8049 Family history of malignant neoplasm of other genital organs: Secondary | ICD-10-CM | POA: Insufficient documentation

## 2024-06-11 DIAGNOSIS — Z8249 Family history of ischemic heart disease and other diseases of the circulatory system: Secondary | ICD-10-CM | POA: Diagnosis not present

## 2024-06-11 DIAGNOSIS — Z79899 Other long term (current) drug therapy: Secondary | ICD-10-CM | POA: Insufficient documentation

## 2024-06-11 DIAGNOSIS — M2550 Pain in unspecified joint: Secondary | ICD-10-CM | POA: Diagnosis not present

## 2024-06-11 NOTE — Progress Notes (Signed)
 Hematology/Oncology Consult note [phone] [phone]        REFERRING PROVIDER: Marylynn Verneita CROME, MD   CHIEF COMPLAINTS/REASON FOR VISIT:  Evaluation of anemia.    ASSESSMENT & PLAN:   Anemia Labs are reviewed and discussed with patient. New onset mild anemia.  Recommend to repeat CBC, check smear, copper , zinc , parvovirus, folate, iron, TIBC ferritin, multiple myeloma panel, light chain ratio, haptoglobin.  Testosterone .   Orders Placed This Encounter  Procedures   Ferritin    Standing Status:   Future    Expected Date:   06/11/2024    Expiration Date:   12/12/2024   Iron and TIBC    Standing Status:   Future    Expected Date:   06/11/2024    Expiration Date:   06/11/2025   CBC with Differential/Platelet    Standing Status:   Future    Expected Date:   06/11/2024    Expiration Date:   06/11/2025   Retic Panel    Standing Status:   Future    Expected Date:   06/11/2024    Expiration Date:   06/11/2025   Multiple Myeloma Panel (SPEP&IFE w/QIG)    Standing Status:   Future    Expected Date:   06/11/2024    Expiration Date:   06/11/2025   Kappa/lambda light chains    Standing Status:   Future    Expected Date:   06/11/2024    Expiration Date:   06/11/2025   Lactate dehydrogenase    Standing Status:   Future    Expected Date:   06/11/2024    Expiration Date:   06/11/2025   Haptoglobin    Standing Status:   Future    Expected Date:   06/11/2024    Expiration Date:   06/11/2025   Folate    Standing Status:   Future    Expected Date:   06/11/2024    Expiration Date:   06/11/2025   Human parvovirus DNA detection by PCR    Standing Status:   Future    Expiration Date:   06/11/2025   Technologist smear review    Standing Status:   Future    Expiration Date:   06/11/2025    Clinical information::   anemia   Testosterone     Standing Status:   Future    Expected Date:   06/11/2024    Expiration Date:   06/11/2025   Copper , serum    Standing Status:   Future    Expected Date:    06/11/2024    Expiration Date:   09/09/2024   Zinc     Standing Status:   Future    Expected Date:   06/11/2024    Expiration Date:   09/09/2024  Follow-up in a few weeks to review results.  All questions were answered. The patient knows to call the clinic with any problems, questions or concerns.  Zelphia Cap, MD, PhD [facility] Hematology Oncology 06/11/2024   HISTORY OF PRESENTING ILLNESS:   Christopher Vazquez is a  58 y.o.  male with PMH listed below was seen in consultation at the request of  Marylynn Verneita CROME, MD  for evaluation of anemia  Discussed the use of AI scribe software for clinical note transcription with the patient, who gave verbal consent to proceed.   Recent blood work showed mild anemia with hemoglobin levels decreasing from 14 one year ago to 11.7 in June and 11.6 in July 2025. This was identified during  routine blood work conducted every three months due to his diabetes. He has been taking over-the-counter B12 supplements since being informed of his anemia, although his B12 levels were found to be normal.  He started experiencing significant fatigue about a month and a half ago, describing a lack of energy and difficulty getting out of bed in the mornings. No chronic kidney condition, alcohol use, or smoking. No night sweats, low-grade fever, or hot flushes. He experiences normal joint pains and has a history of knee surgery. He has been on trazodone  for sleep for years and reports waking once or twice at night to use the bathroom, which he considers normal.  He has a history of significant weight loss, having been on Mounjaro  for approximately 9-10 months, during which he lost significant amount of weight.  He began feeling unwell during this period, and after experiencing elevated levels of an unspecified marker, he discontinued Mounjaro  about 1.5 to 2 months ago. Since stopping the medication, he has regained some weight, currently weighing between 173 and 177 pounds, and has  adjusted his diet to include more protein.  He still does not feel appetite has recovery to his previous baseline.  He has a history of esophageal constriction requiring dilation procedures and reports recent issues with acid reflux and a raspy voice. He is up to date on cancer screenings, with a colonoscopy done six years ago and an esophageal scope in 2021, which included biopsies that returned normal results.  Patient denies any bleeding events.  MEDICAL HISTORY:  Past Medical History:  Diagnosis Date   Cancer (HCC)    basal cell-nose   Complication of anesthesia    with tonsillectomy sever swelling of throat, possible laryngiospasm.   Diabetes mellitus without complication (HCC)    GERD (gastroesophageal reflux disease)    Hyperlipidemia    Hypertension     SURGICAL HISTORY: Past Surgical History:  Procedure Laterality Date   BIOPSY OF SKIN SUBCUTANEOUS TISSUE AND/OR MUCOUS MEMBRANE     nose   CHOLESTEATOMA EXCISION Left 2000   left ear   COLONOSCOPY WITH PROPOFOL  N/A 11/17/2017   Procedure: COLONOSCOPY WITH PROPOFOL ;  Surgeon: Unk Corinn Skiff, MD;  Location: [facility] END[facility];  Service: Gastroenterology;  Laterality: N/A;   ESOPHAGOGASTRODUODENOSCOPY (EGD) WITH PROPOFOL  N/A 09/16/2020   Procedure: ESOPHAGOGASTRODUODENOSCOPY (EGD) WITH PROPOFOL ;  Surgeon: Unk Corinn Skiff, MD;  Location: [facility] ENDOSCOPY;  Service: Gastroenterology;  Laterality: N/A;   HERNIA REPAIR     KNEE ARTHROSCOPY WITH MEDIAL MENISECTOMY Left 07/01/2016   Procedure: left knee arthroscopy with partial medial menisectomy;  Surgeon: Ozell Flake, MD;  Location: [facility] ORS;  Service: Orthopedics;  Laterality: Left;   TONSILLECTOMY AND ADENOIDECTOMY     UMBILICAL HERNIA REPAIR N/A 08/20/2019   Procedure: LAPAROSCOPIC ASSISTED UMBILICAL HERNIA REPAIR WITH MESH;  Surgeon: Gladis Cough, MD;  Location: [facility];  Service: General;  Laterality: N/A;    SOCIAL HISTORY: Social History   Socioeconomic History    Marital status: Married    Spouse name: Not on file   Number of children: 3   Years of education: Not on file   Highest education level: Associate degree: academic program  Occupational History   Occupation: Event organiser: [employer]  Tobacco Use   Smoking status: Never   Smokeless tobacco: Never  Vaping Use   Vaping status: Never Used  Substance and Sexual Activity   Alcohol use: Not Currently   Drug use: No   Sexual activity: Yes  Birth control/protection: None    Comment: Married  Other Topics Concern   Not on file  Social History Narrative   Not on file   Social Drivers of Health   Financial Resource Strain: Low Risk  (06/11/2024)   Overall Financial Resource Strain (CARDIA)    Difficulty of Paying Living Expenses: Not very hard  Food Insecurity: No Food Insecurity (06/11/2024)   Hunger Vital Sign    Worried About Running Out of Food in the Last Year: Never true    Ran Out of Food in the Last Year: Never true  Transportation Needs: No Transportation Needs (06/11/2024)   PRAPARE - Administrator, Civil Service (Medical): No    Lack of Transportation (Non-Medical): No  Physical Activity: Insufficiently Active (10/20/2023)   Exercise Vital Sign    Days of Exercise per Week: 3 days    Minutes of Exercise per Session: 10 min  Stress: Stress Concern Present (10/20/2023)   [employer] of Occupational Health - Occupational Stress Questionnaire    Feeling of Stress : To some extent  Social Connections: Socially Integrated (10/20/2023)   Social Connection and Isolation Panel    Frequency of Communication with Friends and Family: More than three times a week    Frequency of Social Gatherings with Friends and Family: Twice a week    Attends Religious Services: More than 4 times per year    Active Member of [employer] or Organizations: Yes    Attends Engineer, structural: More than 4 times per year    Marital Status: Married  Catering manager  Violence: Not At Risk (06/11/2024)   Humiliation, Afraid, Rape, and Kick questionnaire    Fear of Current or Ex-Partner: No    Emotionally Abused: No    Physically Abused: No    Sexually Abused: No    FAMILY HISTORY: Family History  Problem Relation Age of Onset   Diabetes Mother    Hypertension Mother    Uterine cancer Mother    Aortic aneurysm Father 20   Heart disease Father 22   AAA (abdominal aortic aneurysm) Father    Kidney cancer Sister    Atrial fibrillation Son     ALLERGIES:  is allergic to mounjaro  [tirzepatide ], niacin, shellfish allergy, scopolamine, and niacin and related.  MEDICATIONS:  Current Outpatient Medications  Medication Sig Dispense Refill   aspirin 81 MG tablet Take 81 mg by mouth at bedtime.     atorvastatin  (LIPITOR) 80 MG tablet Take 1 tablet (80 mg total) by mouth daily. 90 tablet 5   fenofibrate  160 MG tablet Take 1 tablet (160 mg total) by mouth daily. 90 tablet 1   metFORMIN  (GLUCOPHAGE ) 1000 MG tablet TAKE 1 TABLET TWICE DAILY  WITH MEALS 180 tablet 0   montelukast  (SINGULAIR ) 10 MG tablet TAKE 1 TABLET AT BEDTIME 90 tablet 0   pantoprazole  (PROTONIX ) 40 MG tablet Take 1 tablet (40 mg total) by mouth daily. 90 tablet 0   sildenafil  (REVATIO ) 20 MG tablet TAKE 1 TABLET BY MOUTH THREE TIMES DAILY (APPOINTMENT  REQUIRED  FOR  FUTURE  REFILLS) 90 tablet 0   telmisartan -hydrochlorothiazide  (MICARDIS  HCT) 80-25 MG tablet Take 1 tablet by mouth daily. 90 tablet 3   terbinafine  (LAMISIL ) 250 MG tablet Take 1 tablet (250 mg total) by mouth daily. 90 tablet 0   traZODone  (DESYREL ) 100 MG tablet TAKE 1 TABLET AT BEDTIME ASNEEDED FOR SLEEP 810 tablet 3   ALPRAZolam  (XANAX ) 0.25 MG tablet Take 1 tablet (0.25  mg total) by mouth at bedtime as needed for anxiety. (Patient not taking: Reported on 06/11/2024) 30 tablet 0   No current facility-administered medications for this visit.    Review of Systems  Constitutional:  Positive for appetite change and fatigue.  Negative for chills and fever.  HENT:   Negative for hearing loss and voice change.   Eyes:  Negative for eye problems and icterus.  Respiratory:  Negative for chest tightness, cough and shortness of breath.   Cardiovascular:  Negative for chest pain and leg swelling.  Gastrointestinal:  Negative for abdominal distention and abdominal pain.  Endocrine: Negative for hot flashes.  Genitourinary:  Negative for difficulty urinating, dysuria and frequency.   Musculoskeletal:  Negative for arthralgias.  Skin:  Negative for itching and rash.  Neurological:  Negative for light-headedness and numbness.  Hematological:  Negative for adenopathy. Does not bruise/bleed easily.  Psychiatric/Behavioral:  Negative for confusion.    PHYSICAL EXAMINATION:  Vitals:   06/11/24 1458  BP: 126/78  Pulse: 65  Resp: 18  Temp: 98.1 F (36.7 C)  SpO2: 100%   Filed Weights   06/11/24 1458  Weight: 177 lb 9.6 oz (80.6 kg)    Physical Exam Constitutional:      General: He is not in acute distress.    Appearance: He is obese.  HENT:     Head: Normocephalic and atraumatic.  Eyes:     General: No scleral icterus. Cardiovascular:     Rate and Rhythm: Normal rate and regular rhythm.  Pulmonary:     Effort: Pulmonary effort is normal. No respiratory distress.     Breath sounds: No wheezing.  Abdominal:     General: Bowel sounds are normal. There is no distension.     Palpations: Abdomen is soft.  Musculoskeletal:        General: No deformity. Normal range of motion.     Cervical back: Normal range of motion and neck supple.  Skin:    General: Skin is dry.     Findings: No erythema or rash.  Neurological:     Mental Status: He is alert and oriented to person, place, and time. Mental status is at baseline.     Cranial Nerves: No cranial nerve deficit.  Psychiatric:        Mood and Affect: Mood normal.     LABORATORY DATA:  I have reviewed the data as listed    Latest Ref Rng & Units 05/14/2024     1:52 PM 05/02/2024    9:34 AM 12/08/2022    9:16 AM  CBC  WBC 4.0 - 10.5 K/uL 4.5  5.7  5.1   Hemoglobin 13.0 - 17.0 g/dL 88.3  88.2  85.2   Hematocrit 39.0 - 52.0 % 34.1  34.3  42.1   Platelets 150.0 - 400.0 K/uL 256.0  249.0  236.0       Latest Ref Rng & Units 05/02/2024    9:34 AM 10/20/2023    9:38 AM 08/26/2023    3:03 PM  CMP  Glucose 70 - 99 mg/dL 90  80  90   BUN 6 - 23 mg/dL 18  18  16    Creatinine 0.40 - 1.50 mg/dL 8.78  8.95  8.95   Sodium 135 - 145 mEq/L 141  142  144   Potassium 3.5 - 5.1 mEq/L 4.6  4.3  4.3   Chloride 96 - 112 mEq/L 103  105  107   CO2 19 - 32 mEq/L 31  30  30   Calcium  8.4 - 10.5 mg/dL 9.6  9.4  9.3   Total Protein 6.0 - 8.3 g/dL 6.5  6.6  6.3   Total Bilirubin 0.2 - 1.2 mg/dL 0.6  0.5  0.4   Alkaline Phos 39 - 117 U/L 25  36    AST 0 - 37 U/L 22  18  22    ALT 0 - 53 U/L 18  15  24        RADIOGRAPHIC STUDIES: I have personally reviewed the radiological images as listed and agreed with the findings in the report. No results found.

## 2024-06-11 NOTE — Assessment & Plan Note (Signed)
 Labs are reviewed and discussed with patient. New onset mild anemia.  Recommend to repeat CBC, check smear, copper , zinc , parvovirus, folate, iron, TIBC ferritin, multiple myeloma panel, light chain ratio, haptoglobin.  Testosterone .

## 2024-06-12 ENCOUNTER — Other Ambulatory Visit

## 2024-06-12 DIAGNOSIS — I10 Essential (primary) hypertension: Secondary | ICD-10-CM | POA: Diagnosis not present

## 2024-06-12 DIAGNOSIS — Z79899 Other long term (current) drug therapy: Secondary | ICD-10-CM | POA: Diagnosis not present

## 2024-06-12 DIAGNOSIS — E785 Hyperlipidemia, unspecified: Secondary | ICD-10-CM | POA: Diagnosis not present

## 2024-06-12 DIAGNOSIS — Z9089 Acquired absence of other organs: Secondary | ICD-10-CM | POA: Diagnosis not present

## 2024-06-12 DIAGNOSIS — Z8049 Family history of malignant neoplasm of other genital organs: Secondary | ICD-10-CM | POA: Diagnosis not present

## 2024-06-12 DIAGNOSIS — K219 Gastro-esophageal reflux disease without esophagitis: Secondary | ICD-10-CM | POA: Diagnosis not present

## 2024-06-12 DIAGNOSIS — M2550 Pain in unspecified joint: Secondary | ICD-10-CM | POA: Diagnosis not present

## 2024-06-12 DIAGNOSIS — E119 Type 2 diabetes mellitus without complications: Secondary | ICD-10-CM | POA: Diagnosis not present

## 2024-06-12 DIAGNOSIS — Z833 Family history of diabetes mellitus: Secondary | ICD-10-CM | POA: Diagnosis not present

## 2024-06-12 DIAGNOSIS — D649 Anemia, unspecified: Secondary | ICD-10-CM | POA: Diagnosis not present

## 2024-06-12 DIAGNOSIS — R5383 Other fatigue: Secondary | ICD-10-CM | POA: Diagnosis not present

## 2024-06-12 DIAGNOSIS — Z8249 Family history of ischemic heart disease and other diseases of the circulatory system: Secondary | ICD-10-CM | POA: Diagnosis not present

## 2024-06-12 DIAGNOSIS — Z7982 Long term (current) use of aspirin: Secondary | ICD-10-CM | POA: Diagnosis not present

## 2024-06-12 DIAGNOSIS — Z8051 Family history of malignant neoplasm of kidney: Secondary | ICD-10-CM | POA: Diagnosis not present

## 2024-06-12 DIAGNOSIS — Z7985 Long-term (current) use of injectable non-insulin antidiabetic drugs: Secondary | ICD-10-CM | POA: Diagnosis not present

## 2024-06-12 LAB — TECHNOLOGIST SMEAR REVIEW
Plt Morphology: NORMAL
RBC MORPHOLOGY: NORMAL
WBC MORPHOLOGY: NORMAL

## 2024-06-12 LAB — IRON AND TIBC
Iron: 109 ug/dL (ref 45–182)
Saturation Ratios: 27 % (ref 17.9–39.5)
TIBC: 406 ug/dL (ref 250–450)
UIBC: 297 ug/dL

## 2024-06-12 LAB — CBC WITH DIFFERENTIAL/PLATELET
Abs Immature Granulocytes: 0.02 K/uL (ref 0.00–0.07)
Basophils Absolute: 0 K/uL (ref 0.0–0.1)
Basophils Relative: 1 %
Eosinophils Absolute: 0.1 K/uL (ref 0.0–0.5)
Eosinophils Relative: 3 %
HCT: 35.4 % — ABNORMAL LOW (ref 39.0–52.0)
Hemoglobin: 11.8 g/dL — ABNORMAL LOW (ref 13.0–17.0)
Immature Granulocytes: 1 %
Lymphocytes Relative: 34 %
Lymphs Abs: 1.4 K/uL (ref 0.7–4.0)
MCH: 30.4 pg (ref 26.0–34.0)
MCHC: 33.3 g/dL (ref 30.0–36.0)
MCV: 91.2 fL (ref 80.0–100.0)
Monocytes Absolute: 0.4 K/uL (ref 0.1–1.0)
Monocytes Relative: 9 %
Neutro Abs: 2.2 K/uL (ref 1.7–7.7)
Neutrophils Relative %: 52 %
Platelets: 226 K/uL (ref 150–400)
RBC: 3.88 MIL/uL — ABNORMAL LOW (ref 4.22–5.81)
RDW: 14.6 % (ref 11.5–15.5)
WBC: 4.2 K/uL (ref 4.0–10.5)
nRBC: 0 % (ref 0.0–0.2)

## 2024-06-12 LAB — RETIC PANEL
Immature Retic Fract: 7.2 % (ref 2.3–15.9)
RBC.: 3.84 MIL/uL — ABNORMAL LOW (ref 4.22–5.81)
Retic Count, Absolute: 60.3 K/uL (ref 19.0–186.0)
Retic Ct Pct: 1.6 % (ref 0.4–3.1)
Reticulocyte Hemoglobin: 34.6 pg (ref >27.9)

## 2024-06-12 LAB — LACTATE DEHYDROGENASE: LDH: 57 U/L — ABNORMAL LOW (ref 98–192)

## 2024-06-12 LAB — FOLATE: Folate: 5.6 ng/mL — ABNORMAL LOW (ref >5.9)

## 2024-06-12 LAB — FERRITIN: Ferritin: 131 ng/mL (ref 24–336)

## 2024-06-13 LAB — HAPTOGLOBIN: Haptoglobin: 71 mg/dL (ref 29–370)

## 2024-06-13 LAB — KAPPA/LAMBDA LIGHT CHAINS
Kappa free light chain: 25.8 mg/L — ABNORMAL HIGH (ref 3.3–19.4)
Kappa, lambda light chain ratio: 1.54 (ref 0.26–1.65)
Lambda free light chains: 16.7 mg/L (ref 5.7–26.3)

## 2024-06-13 LAB — TESTOSTERONE: Testosterone: 848 ng/dL (ref 264–916)

## 2024-06-14 LAB — MULTIPLE MYELOMA PANEL, SERUM
Albumin SerPl Elph-Mcnc: 3.7 g/dL (ref 2.9–4.4)
Albumin/Glob SerPl: 1.3 (ref 0.7–1.7)
Alpha 1: 0.2 g/dL (ref 0.0–0.4)
Alpha2 Glob SerPl Elph-Mcnc: 0.7 g/dL (ref 0.4–1.0)
B-Globulin SerPl Elph-Mcnc: 1 g/dL (ref 0.7–1.3)
Gamma Glob SerPl Elph-Mcnc: 1 g/dL (ref 0.4–1.8)
Globulin, Total: 2.9 g/dL (ref 2.2–3.9)
IgA: 168 mg/dL (ref 90–386)
IgG (Immunoglobin G), Serum: 1040 mg/dL (ref 603–1613)
IgM (Immunoglobulin M), Srm: 36 mg/dL (ref 20–172)
Total Protein ELP: 6.6 g/dL (ref 6.0–8.5)

## 2024-06-14 LAB — COPPER, SERUM: Copper: 78 ug/dL (ref 69–132)

## 2024-06-14 LAB — ZINC: Zinc: 67 ug/dL (ref 44–115)

## 2024-06-14 LAB — HUMAN PARVOVIRUS DNA DETECTION BY PCR: Parvovirus B19, PCR: NEGATIVE

## 2024-06-17 ENCOUNTER — Ambulatory Visit: Payer: Self-pay | Admitting: Oncology

## 2024-06-18 NOTE — Telephone Encounter (Signed)
 Called patient and informed him that his folate level is low. I advised him that Dr. Babara advises him to take OTC Folic acid 1mg  daily and keep all follow-up appointments the same. Patient gave verbal understanding to this and stated that he will pick up folic acid today.

## 2024-06-18 NOTE — Telephone Encounter (Signed)
-----   Message from Zelphia Cap sent at 06/17/2024  1:58 PM EDT ----- Folate level is low. Please advise pt to take otc folic acid  1mg  daily.  Keep same follow up appt ----- Message ----- From: Interface, Lab In [place] Sent: 06/12/2024   8:21 AM EDT To: Zelphia Cap, MD

## 2024-06-19 ENCOUNTER — Ambulatory Visit (INDEPENDENT_AMBULATORY_CARE_PROVIDER_SITE_OTHER): Admitting: Internal Medicine

## 2024-06-19 ENCOUNTER — Encounter: Payer: Self-pay | Admitting: Internal Medicine

## 2024-06-19 VITALS — BP 110/72 | HR 67 | Ht 68.0 in | Wt 178.6 lb

## 2024-06-19 DIAGNOSIS — I10 Essential (primary) hypertension: Secondary | ICD-10-CM

## 2024-06-19 DIAGNOSIS — E1169 Type 2 diabetes mellitus with other specified complication: Secondary | ICD-10-CM

## 2024-06-19 DIAGNOSIS — E669 Obesity, unspecified: Secondary | ICD-10-CM

## 2024-06-19 DIAGNOSIS — E782 Mixed hyperlipidemia: Secondary | ICD-10-CM

## 2024-06-19 DIAGNOSIS — I152 Hypertension secondary to endocrine disorders: Secondary | ICD-10-CM

## 2024-06-19 DIAGNOSIS — D520 Dietary folate deficiency anemia: Secondary | ICD-10-CM | POA: Diagnosis not present

## 2024-06-19 DIAGNOSIS — E1159 Type 2 diabetes mellitus with other circulatory complications: Secondary | ICD-10-CM

## 2024-06-19 DIAGNOSIS — B351 Tinea unguium: Secondary | ICD-10-CM

## 2024-06-19 DIAGNOSIS — R1319 Other dysphagia: Secondary | ICD-10-CM

## 2024-06-19 DIAGNOSIS — Z6827 Body mass index (BMI) 27.0-27.9, adult: Secondary | ICD-10-CM

## 2024-06-19 MED ORDER — AZITHROMYCIN 500 MG PO TABS: 500.0000 mg | ORAL_TABLET | Freq: Every day | ORAL | 0 refills | Status: DC

## 2024-06-19 MED ORDER — PREDNISONE 10 MG PO TABS
ORAL_TABLET | ORAL | 0 refills | Status: DC
Start: 2024-06-19 — End: 2024-08-14

## 2024-06-19 MED ORDER — METFORMIN HCL 1000 MG PO TABS: 1000.0000 mg | ORAL_TABLET | Freq: Two times a day (BID) | ORAL | 1 refills | Status: AC

## 2024-06-19 MED ORDER — PANTOPRAZOLE SODIUM 40 MG PO TBEC: 40.0000 mg | DELAYED_RELEASE_TABLET | Freq: Every day | ORAL | 1 refills | Status: DC

## 2024-06-19 MED ORDER — MONTELUKAST SODIUM 10 MG PO TABS: 10.0000 mg | ORAL_TABLET | Freq: Every day | ORAL | 1 refills | Status: DC

## 2024-06-19 MED ORDER — TERBINAFINE HCL 250 MG PO TABS: 250.0000 mg | ORAL_TABLET | Freq: Every day | ORAL | 0 refills | Status: DC

## 2024-06-19 NOTE — Progress Notes (Signed)
 Subjective:  Patient ID: Christopher Vazquez, male    DOB: [birth year]  Age: 58 y.o. MRN: [id number]  CC: The primary encounter diagnosis was Obesity, diabetes, and hypertension syndrome (HCC). Diagnoses of Mixed hyperlipidemia, Esophageal dysphagia, Dietary folate deficiency anemia, Essential hypertension, and Onychomycosis were also pertinent to this visit.   HPI Christopher Vazquez presents for  Chief Complaint  Patient presents with   Medical Management of Chronic Issues   1) Type 2 DM:  He feels generally well, is exercising several times per week and checking blood sugars once daily at variable times.  BS have been under 130 fasting and < 150 post prandially.  Denies any recent hypoglyemic events.  Taking his medications as directed. Following a carbohydrate modified diet 6 days per week. Denies numbness, burning and tingling of extremities. Appetite is good.      2) anemia: mild,  seeing hematology,  folate level low. All other labs noncerning (no  M spike)  3) Hypertension: has been having labile readings as low as 90/5- on temlisartan hct  80/25   4) history of obesity:  weight dropped to 161 lbs on mounjaro  but he developed pancreatitis, mild. Sinc e stopping medication he  has had a weight regain o f 17 lbs   5) onychomycosis:  no change with topical antifungal.  Saw podiatry.  Took lamisil  and atorvastatin  for 3 months  lfts were normal in June during treatment  6) choking episode last week while eating steak .  Did a self heimlich at the restaurant.  Voice hoarse prior to epsidoe but worse now.  Has cervical osteophytes     Outpatient Medications Prior to Visit  Medication Sig Dispense Refill   ALPRAZolam  (XANAX ) 0.25 MG tablet Take 1 tablet (0.25 mg total) by mouth at bedtime as needed for anxiety. 30 tablet 0   aspirin 81 MG tablet Take 81 mg by mouth at bedtime.     atorvastatin  (LIPITOR) 80 MG tablet Take 1 tablet (80 mg total) by mouth daily. 90 tablet 5   fenofibrate  160  MG tablet Take 1 tablet (160 mg total) by mouth daily. 90 tablet 1   folic acid (FOLVITE) 1 MG tablet Take 1 mg by mouth daily.     sildenafil  (REVATIO ) 20 MG tablet TAKE 1 TABLET BY MOUTH THREE TIMES DAILY (APPOINTMENT  REQUIRED  FOR  FUTURE  REFILLS) 90 tablet 0   telmisartan -hydrochlorothiazide  (MICARDIS  HCT) 80-25 MG tablet Take 1 tablet by mouth daily. 90 tablet 3   traZODone  (DESYREL ) 100 MG tablet TAKE 1 TABLET AT BEDTIME ASNEEDED FOR SLEEP 810 tablet 3   terbinafine  (LAMISIL ) 250 MG tablet Take 1 tablet (250 mg total) by mouth daily. 90 tablet 0   metFORMIN  (GLUCOPHAGE ) 1000 MG tablet TAKE 1 TABLET TWICE DAILY  WITH MEALS 180 tablet 0   montelukast  (SINGULAIR ) 10 MG tablet TAKE 1 TABLET AT BEDTIME 90 tablet 0   pantoprazole  (PROTONIX ) 40 MG tablet Take 1 tablet (40 mg total) by mouth daily. 90 tablet 0   No facility-administered medications prior to visit.    Review of Systems;  Patient denies headache, fevers, malaise, unintentional weight loss, skin rash, eye pain, sinus congestion and sinus pain, sore throat, dysphagia,  hemoptysis , cough, dyspnea, wheezing, chest pain, palpitations, orthopnea, edema, abdominal pain, nausea, melena, diarrhea, constipation, flank pain, dysuria, hematuria, urinary  Frequency, nocturia, numbness, tingling, seizures,  Focal weakness, Loss of consciousness,  Tremor, insomnia, depression, anxiety, and suicidal ideation.  Objective:  BP 110/72   Pulse 67   Ht 5' 8 (1.727 m)   Wt 178 lb 9.6 oz (81 kg)   SpO2 97%   BMI 27.16 kg/m   BP Readings from Last 3 Encounters:  06/19/24 110/72  06/11/24 126/78  10/20/23 112/66    Wt Readings from Last 3 Encounters:  06/19/24 178 lb 9.6 oz (81 kg)  06/11/24 177 lb 9.6 oz (80.6 kg)  10/20/23 168 lb 3.2 oz (76.3 kg)    Physical Exam Vitals reviewed.  Constitutional:      General: He is not in acute distress.    Appearance: Normal appearance. He is normal weight. He is not ill-appearing,  toxic-appearing or diaphoretic.  HENT:     Head: Normocephalic.  Eyes:     General: No scleral icterus.       Right eye: No discharge.        Left eye: No discharge.     Conjunctiva/sclera: Conjunctivae normal.  Cardiovascular:     Rate and Rhythm: Normal rate and regular rhythm.     Heart sounds: Normal heart sounds.  Pulmonary:     Effort: Pulmonary effort is normal. No respiratory distress.     Breath sounds: Normal breath sounds.  Musculoskeletal:        General: Normal range of motion.     Cervical back: Normal range of motion.  Skin:    General: Skin is warm and dry.  Neurological:     General: No focal deficit present.     Mental Status: He is alert and oriented to person, place, and time. Mental status is at baseline.  Psychiatric:        Mood and Affect: Mood normal.        Behavior: Behavior normal.        Thought Content: Thought content normal.        Judgment: Judgment normal.     Lab Results  Component Value Date   HGBA1C 5.6 05/02/2024   HGBA1C 5.7 10/20/2023   HGBA1C 5.3 07/21/2023    Lab Results  Component Value Date   CREATININE 1.21 05/02/2024   CREATININE 1.04 10/20/2023   CREATININE 1.04 08/26/2023    Lab Results  Component Value Date   WBC 4.2 06/12/2024   HGB 11.8 (L) 06/12/2024   HCT 35.4 (L) 06/12/2024   PLT 226 06/12/2024   GLUCOSE 90 05/02/2024   CHOL 100 05/02/2024   TRIG 89.0 05/02/2024   HDL 31.00 (L) 05/02/2024   LDLDIRECT 56.0 05/02/2024   LDLCALC 51 05/02/2024   ALT 18 05/02/2024   AST 22 05/02/2024   NA 141 05/02/2024   K 4.6 05/02/2024   CL 103 05/02/2024   CREATININE 1.21 05/02/2024   BUN 18 05/02/2024   CO2 31 05/02/2024   TSH 1.49 05/02/2024   PSA 0.69 05/02/2024   HGBA1C 5.6 05/02/2024   MICROALBUR 1.5 05/02/2024    No results found.  Assessment & Plan:  .Obesity, diabetes, and hypertension syndrome (HCC) -     Comprehensive metabolic panel with GFR; Future -     Hemoglobin A1c; Future  Mixed  hyperlipidemia -     Lipid panel; Future -     LDL cholesterol, direct; Future  Esophageal dysphagia -     DG ESOPHAGUS W SINGLE CM (SOL OR THIN BA); Future  Dietary folate deficiency anemia Assessment & Plan: Secondary to folate deficiency by recent workup.  Supplementing    Essential hypertension Assessment & Plan: Reducing dose  of  telmisartan /hydrochlorothiazide  to 40/12.5    Onychomycosis Assessment & Plan: Resume lamisil  for 3 months; suspend atorvastatin .  Rechck LFTS 8 weeks   Other orders -     metFORMIN  HCl; Take 1 tablet (1,000 mg total) by mouth 2 (two) times daily with a meal.  Dispense: 180 tablet; Refill: 1 -     Montelukast  Sodium; Take 1 tablet (10 mg total) by mouth at bedtime.  Dispense: 90 tablet; Refill: 1 -     Pantoprazole  Sodium; Take 1 tablet (40 mg total) by mouth daily.  Dispense: 90 tablet; Refill: 1 -     Terbinafine  HCl; Take 1 tablet (250 mg total) by mouth daily.  Dispense: 90 tablet; Refill: 0 -     predniSONE ; 6 tablets on Days 1 and 2,   then reduce by 1 tablet  every 2 days  until gone  Dispense: 42 tablet; Refill: 0 -     Azithromycin ; Take 1 tablet (500 mg total) by mouth daily.  Dispense: 14 tablet; Refill: 0    I personally spent a total of 34 minutes in the care of the patient today including preparing to see the patient, getting/reviewing separately obtained history, performing a medically appropriate exam/evaluation, counseling and educating, placing orders, and independently interpreting results.  Follow-up: Return in about 6 months (around 12/20/2024).   Verneita LITTIE Kettering, MD

## 2024-06-19 NOTE — Patient Instructions (Addendum)
 Decrease  your blood pressure medication dose to 40/12.5 mg of telmisartan /hct using your OLD TABLETS .  If youe pressure still drops < 100,  let me know so I can split the the medications and get rid of the hydrochlorothiazide    Stop the atorvastatin   Take the lamisil  for 3 months   Return for fasting labs in 8 weeks   I sent a double dose of prednisone  and azithromycin  to [facility] to TAKE WITH YOU ON YOUR CRUISE (ENOUGH FOR YOU AND ANGIE)   I am ordering a DG esophagus to evaluate your swallowing issues.   This is an x ray of your esophagus during different phases of swallowing.  You will be asked to drink a chalky substance and pictures will be taken during your swallowing to see if there is a stricture.  Please call [phone] to set this up at [facility]

## 2024-06-20 NOTE — Assessment & Plan Note (Signed)
 Resume lamisil  for 3 months; suspend atorvastatin .  Rechck LFTS 8 weeks

## 2024-06-20 NOTE — Assessment & Plan Note (Signed)
 Secondary to folate deficiency by recent workup.  Supplementing

## 2024-06-20 NOTE — Assessment & Plan Note (Signed)
 Reducing dose of  telmisartan /hydrochlorothiazide  to 40/12.5

## 2024-06-20 NOTE — Assessment & Plan Note (Signed)
 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  DG esophagus ordered.  Continue PPI

## 2024-06-26 ENCOUNTER — Other Ambulatory Visit: Payer: Self-pay | Admitting: Internal Medicine

## 2024-06-30 ENCOUNTER — Other Ambulatory Visit: Payer: Self-pay | Admitting: Internal Medicine

## 2024-07-03 NOTE — Telephone Encounter (Signed)
 Medication was just called in on 06/19/24 so please refuse.

## 2024-07-04 ENCOUNTER — Ambulatory Visit
Admission: RE | Admit: 2024-07-04 | Discharge: 2024-07-04 | Disposition: A | Discharge status: Home / Self Care | Source: Ambulatory Visit | Attending: Internal Medicine | Admitting: Internal Medicine

## 2024-07-04 ENCOUNTER — Other Ambulatory Visit: Payer: Self-pay | Admitting: Internal Medicine

## 2024-07-04 DIAGNOSIS — E669 Obesity, unspecified: Secondary | ICD-10-CM

## 2024-07-04 DIAGNOSIS — K224 Dyskinesia of esophagus: Secondary | ICD-10-CM | POA: Diagnosis not present

## 2024-07-04 DIAGNOSIS — R1310 Dysphagia, unspecified: Secondary | ICD-10-CM | POA: Diagnosis not present

## 2024-07-04 DIAGNOSIS — K219 Gastro-esophageal reflux disease without esophagitis: Secondary | ICD-10-CM | POA: Diagnosis not present

## 2024-07-04 DIAGNOSIS — R1319 Other dysphagia: Secondary | ICD-10-CM

## 2024-07-04 DIAGNOSIS — I10 Essential (primary) hypertension: Secondary | ICD-10-CM

## 2024-07-04 DIAGNOSIS — E782 Mixed hyperlipidemia: Secondary | ICD-10-CM

## 2024-07-04 DIAGNOSIS — D520 Dietary folate deficiency anemia: Secondary | ICD-10-CM

## 2024-07-04 DIAGNOSIS — B351 Tinea unguium: Secondary | ICD-10-CM

## 2024-07-06 ENCOUNTER — Encounter: Payer: Self-pay | Admitting: Internal Medicine

## 2024-07-09 ENCOUNTER — Other Ambulatory Visit: Payer: Self-pay | Admitting: Medical Genetics

## 2024-07-11 ENCOUNTER — Inpatient Hospital Stay: Attending: Oncology | Admitting: Oncology

## 2024-07-11 ENCOUNTER — Encounter: Payer: Self-pay | Admitting: Oncology

## 2024-07-11 VITALS — BP 123/77 | HR 76 | Temp 99.0°F | Resp 16 | Wt 179.0 lb

## 2024-07-11 DIAGNOSIS — Z825 Family history of asthma and other chronic lower respiratory diseases: Secondary | ICD-10-CM | POA: Insufficient documentation

## 2024-07-11 DIAGNOSIS — Z85828 Personal history of other malignant neoplasm of skin: Secondary | ICD-10-CM | POA: Diagnosis not present

## 2024-07-11 DIAGNOSIS — R5383 Other fatigue: Secondary | ICD-10-CM | POA: Insufficient documentation

## 2024-07-11 DIAGNOSIS — M2550 Pain in unspecified joint: Secondary | ICD-10-CM | POA: Insufficient documentation

## 2024-07-11 DIAGNOSIS — K224 Dyskinesia of esophagus: Secondary | ICD-10-CM | POA: Insufficient documentation

## 2024-07-11 DIAGNOSIS — Z8261 Family history of arthritis: Secondary | ICD-10-CM | POA: Diagnosis not present

## 2024-07-11 DIAGNOSIS — E119 Type 2 diabetes mellitus without complications: Secondary | ICD-10-CM | POA: Diagnosis not present

## 2024-07-11 DIAGNOSIS — Z8049 Family history of malignant neoplasm of other genital organs: Secondary | ICD-10-CM | POA: Insufficient documentation

## 2024-07-11 DIAGNOSIS — Z8051 Family history of malignant neoplasm of kidney: Secondary | ICD-10-CM | POA: Diagnosis not present

## 2024-07-11 DIAGNOSIS — Z8349 Family history of other endocrine, nutritional and metabolic diseases: Secondary | ICD-10-CM | POA: Insufficient documentation

## 2024-07-11 DIAGNOSIS — Z7982 Long term (current) use of aspirin: Secondary | ICD-10-CM | POA: Insufficient documentation

## 2024-07-11 DIAGNOSIS — D520 Dietary folate deficiency anemia: Secondary | ICD-10-CM

## 2024-07-11 DIAGNOSIS — D529 Folate deficiency anemia, unspecified: Secondary | ICD-10-CM | POA: Diagnosis not present

## 2024-07-11 DIAGNOSIS — Z8249 Family history of ischemic heart disease and other diseases of the circulatory system: Secondary | ICD-10-CM | POA: Insufficient documentation

## 2024-07-11 DIAGNOSIS — D649 Anemia, unspecified: Secondary | ICD-10-CM | POA: Diagnosis not present

## 2024-07-11 DIAGNOSIS — Z823 Family history of stroke: Secondary | ICD-10-CM | POA: Insufficient documentation

## 2024-07-11 DIAGNOSIS — Z841 Family history of disorders of kidney and ureter: Secondary | ICD-10-CM | POA: Insufficient documentation

## 2024-07-11 DIAGNOSIS — Z79899 Other long term (current) drug therapy: Secondary | ICD-10-CM | POA: Diagnosis not present

## 2024-07-11 DIAGNOSIS — Z83438 Family history of other disorder of lipoprotein metabolism and other lipidemia: Secondary | ICD-10-CM | POA: Diagnosis not present

## 2024-07-11 DIAGNOSIS — K219 Gastro-esophageal reflux disease without esophagitis: Secondary | ICD-10-CM | POA: Insufficient documentation

## 2024-07-11 DIAGNOSIS — E785 Hyperlipidemia, unspecified: Secondary | ICD-10-CM | POA: Insufficient documentation

## 2024-07-11 DIAGNOSIS — Z9089 Acquired absence of other organs: Secondary | ICD-10-CM | POA: Diagnosis not present

## 2024-07-11 DIAGNOSIS — I10 Essential (primary) hypertension: Secondary | ICD-10-CM | POA: Diagnosis not present

## 2024-07-11 DIAGNOSIS — Z833 Family history of diabetes mellitus: Secondary | ICD-10-CM | POA: Diagnosis not present

## 2024-07-11 NOTE — Assessment & Plan Note (Signed)
 Recommend Folic acid  supplementation.

## 2024-07-11 NOTE — Progress Notes (Signed)
 Hematology/Oncology Progress note [phone] [phone]           REFERRING PROVIDER: Marylynn Verneita CROME, MD   CHIEF COMPLAINTS/REASON FOR VISIT:  Folate deficiency   ASSESSMENT & PLAN:   Folic acid  deficiency anemia Recommend Folic acid  supplementation.  Anemia work up results were reviewed and discussed with patient.   Orders Placed This Encounter  Procedures   CBC with Differential (Cancer Center Only)    Standing Status:   Future    Expected Date:   11/10/2024    Expiration Date:   02/08/2025   Folate    Standing Status:   Future    Expected Date:   11/10/2024    Expiration Date:   02/08/2025  Follow-up in 4 months  All questions were answered. The patient knows to call the clinic with any problems, questions or concerns.  Zelphia Cap, MD, PhD [facility] Hematology Oncology 07/11/2024   HISTORY OF PRESENTING ILLNESS:   Christopher Vazquez is a  58 y.o.  male with PMH listed below was seen in consultation at the request of  Marylynn Verneita CROME, MD  for evaluation of anemia  Discussed the use of AI scribe software for clinical note transcription with the patient, who gave verbal consent to proceed.   Recent blood work showed mild anemia with hemoglobin levels decreasing from 14 one year ago to 11.7 in June and 11.6 in July 2025. This was identified during routine blood work conducted every three months due to his diabetes. He has been taking over-the-counter B12 supplements since being informed of his anemia, although his B12 levels were found to be normal.  He started experiencing significant fatigue about a month and a half ago, describing a lack of energy and difficulty getting out of bed in the mornings. No chronic kidney condition, alcohol use, or smoking. No night sweats, low-grade fever, or hot flushes. He experiences normal joint pains and has a history of knee surgery. He has been on trazodone  for sleep for years and reports waking once or twice at night to  use the bathroom, which he considers normal.  He has a history of significant weight loss, having been on Mounjaro  for approximately 9-10 months, during which he lost significant amount of weight.  He began feeling unwell during this period, and after experiencing elevated levels of an unspecified marker, he discontinued Mounjaro  about 1.5 to 2 months ago. Since stopping the medication, he has regained some weight, currently weighing between 173 and 177 pounds, and has adjusted his diet to include more protein.  He still does not feel appetite has recovery to his previous baseline.  He has a history of esophageal constriction requiring dilation procedures and reports recent issues with acid reflux and a raspy voice. He is up to date on cancer screenings, with a colonoscopy done six years ago and an esophageal scope in 2021, which included biopsies that returned normal results.  Patient denies any bleeding events. INTERVAL HISTORY Christopher Vazquez is a 58 y.o. male who has above history reviewed by me today presents for follow up visit for anemia.  He has blood work up done and was found to have folate deficiency.  He has started folic acid  supplementation.   MEDICAL HISTORY:  Past Medical History:  Diagnosis Date   Allergy    Anemia 2025   Cancer (HCC)    basal cell-nose   Complication of anesthesia    with tonsillectomy sever swelling of throat, possible laryngiospasm.  Diabetes mellitus without complication (HCC)    GERD (gastroesophageal reflux disease) 2012   Hyperlipidemia    Hypertension    Skin cancer 2020    SURGICAL HISTORY: Past Surgical History:  Procedure Laterality Date   BIOPSY OF SKIN SUBCUTANEOUS TISSUE AND/OR MUCOUS MEMBRANE     nose   CHOLESTEATOMA EXCISION Left 2000   left ear   COLONOSCOPY WITH PROPOFOL  N/A 11/17/2017   Procedure: COLONOSCOPY WITH PROPOFOL ;  Surgeon: Unk Corinn Skiff, MD;  Location: [facility] END[facility]COPY;  Service: Gastroenterology;  Laterality:  N/A;   ESOPHAGOGASTRODUODENOSCOPY (EGD) WITH PROPOFOL  N/A 09/16/2020   Procedure: ESOPHAGOGASTRODUODENOSCOPY (EGD) WITH PROPOFOL ;  Surgeon: Unk Corinn Skiff, MD;  Location: [facility] ENDOSCOPY;  Service: Gastroenterology;  Laterality: N/A;   HERNIA REPAIR     KNEE ARTHROSCOPY WITH MEDIAL MENISECTOMY Left 07/01/2016   Procedure: left knee arthroscopy with partial medial menisectomy;  Surgeon: Ozell Flake, MD;  Location: [facility] ORS;  Service: Orthopedics;  Laterality: Left;   TONSILLECTOMY AND ADENOIDECTOMY     UMBILICAL HERNIA REPAIR N/A 08/20/2019   Procedure: LAPAROSCOPIC ASSISTED UMBILICAL HERNIA REPAIR WITH MESH;  Surgeon: Gladis Cough, MD;  Location: [facility];  Service: General;  Laterality: N/A;    SOCIAL HISTORY: Social History   Socioeconomic History   Marital status: Married    Spouse name: Not on file   Number of children: 3   Years of education: Not on file   Highest education level: Associate degree: academic program  Occupational History   Occupation: Event organiser: [employer]  Tobacco Use   Smoking status: Never   Smokeless tobacco: Never  Vaping Use   Vaping status: Never Used  Substance and Sexual Activity   Alcohol use: Not Currently   Drug use: No   Sexual activity: Yes    Birth control/protection: None    Comment: Married  Other Topics Concern   Not on file  Social History Narrative   Not on file   Social Drivers of Health   Financial Resource Strain: Low Risk  (06/18/2024)   Overall Financial Resource Strain (CARDIA)    Difficulty of Paying Living Expenses: Not very hard  Food Insecurity: No Food Insecurity (06/18/2024)   Hunger Vital Sign    Worried About Running Out of Food in the Last Year: Never true    Ran Out of Food in the Last Year: Never true  Transportation Needs: No Transportation Needs (06/18/2024)   PRAPARE - Administrator, Civil Service (Medical): No    Lack of Transportation (Non-Medical): No  Physical Activity:  Insufficiently Active (06/18/2024)   Exercise Vital Sign    Days of Exercise per Week: 2 days    Minutes of Exercise per Session: 10 min  Stress: No Stress Concern Present (06/18/2024)   [employer] of Occupational Health - Occupational Stress Questionnaire    Feeling of Stress: Only a little  Social Connections: Socially Integrated (06/18/2024)   Social Connection and Isolation Panel    Frequency of Communication with Friends and Family: More than three times a week    Frequency of Social Gatherings with Friends and Family: Twice a week    Attends Religious Services: More than 4 times per year    Active Member of [employer] or Organizations: Yes    Attends Banker Meetings: More than 4 times per year    Marital Status: Married  Catering manager Violence: Not At Risk (06/11/2024)   Humiliation, Afraid, Rape, and Kick questionnaire    Fear  of Current or Ex-Partner: No    Emotionally Abused: No    Physically Abused: No    Sexually Abused: No    FAMILY HISTORY: Family History  Problem Relation Age of Onset   Diabetes Mother    Hypertension Mother    Uterine cancer Mother    Cancer Mother    Varicose Veins Mother    Aortic aneurysm Father 82   Heart disease Father 59   AAA (abdominal aortic aneurysm) Father    Arthritis Father    Cancer Father    Hyperlipidemia Father    Kidney disease Father    Kidney cancer Sister    Cancer Sister    Kidney disease Sister    Atrial fibrillation Son    Asthma Maternal Grandfather    Diabetes Maternal Grandfather    Early death Paternal Grandfather    Heart disease Paternal Grandfather    Stroke Paternal Grandfather    Cancer Maternal Aunt     ALLERGIES:  is allergic to mounjaro  [tirzepatide ], niacin, shellfish allergy, scopolamine, and niacin and related.  MEDICATIONS:  Current Outpatient Medications  Medication Sig Dispense Refill   ALPRAZolam  (XANAX ) 0.25 MG tablet Take 1 tablet (0.25 mg total) by mouth at bedtime as  needed for anxiety. 30 tablet 0   aspirin 81 MG tablet Take 81 mg by mouth at bedtime.     azithromycin  (ZITHROMAX ) 500 MG tablet Take 1 tablet (500 mg total) by mouth daily. 14 tablet 0   fenofibrate  160 MG tablet Take 1 tablet (160 mg total) by mouth daily. 90 tablet 1   folic acid  (FOLVITE ) 1 MG tablet Take 1 mg by mouth daily.     metFORMIN  (GLUCOPHAGE ) 1000 MG tablet Take 1 tablet (1,000 mg total) by mouth 2 (two) times daily with a meal. 180 tablet 1   montelukast  (SINGULAIR ) 10 MG tablet Take 1 tablet (10 mg total) by mouth at bedtime. 90 tablet 1   pantoprazole  (PROTONIX ) 40 MG tablet Take 1 tablet (40 mg total) by mouth daily. 90 tablet 1   predniSONE  (DELTASONE ) 10 MG tablet 6 tablets on Days 1 and 2,   then reduce by 1 tablet  every 2 days  until gone 42 tablet 0   sildenafil  (REVATIO ) 20 MG tablet TAKE 1 TABLET BY MOUTH THREE TIMES DAILY ( 90 tablet 3   telmisartan -hydrochlorothiazide  (MICARDIS  HCT) 80-25 MG tablet Take 1 tablet by mouth daily. 90 tablet 3   terbinafine  (LAMISIL ) 250 MG tablet Take 1 tablet (250 mg total) by mouth daily. 90 tablet 0   traZODone  (DESYREL ) 100 MG tablet TAKE 1 TABLET AT BEDTIME ASNEEDED FOR SLEEP 810 tablet 3   atorvastatin  (LIPITOR) 80 MG tablet Take 1 tablet (80 mg total) by mouth daily. (Patient not taking: Reported on 07/11/2024) 90 tablet 5   No current facility-administered medications for this visit.    Review of Systems  Constitutional:  Positive for appetite change and fatigue. Negative for chills and fever.  HENT:   Negative for hearing loss and voice change.   Eyes:  Negative for eye problems and icterus.  Respiratory:  Negative for chest tightness, cough and shortness of breath.   Cardiovascular:  Negative for chest pain and leg swelling.  Gastrointestinal:  Negative for abdominal distention and abdominal pain.  Endocrine: Negative for hot flashes.  Genitourinary:  Negative for difficulty urinating, dysuria and frequency.    Musculoskeletal:  Negative for arthralgias.  Skin:  Negative for itching and rash.  Neurological:  Negative for  light-headedness and numbness.  Hematological:  Negative for adenopathy. Does not bruise/bleed easily.  Psychiatric/Behavioral:  Negative for confusion.    PHYSICAL EXAMINATION:  Vitals:   07/11/24 1443  BP: 123/77  Pulse: 76  Resp: 16  Temp: 99 F (37.2 C)  SpO2: 98%   Filed Weights   07/11/24 1443  Weight: 179 lb (81.2 kg)    Physical Exam Constitutional:      General: He is not in acute distress.    Appearance: He is obese.  HENT:     Head: Normocephalic and atraumatic.  Eyes:     General: No scleral icterus. Cardiovascular:     Rate and Rhythm: Normal rate and regular rhythm.  Pulmonary:     Effort: Pulmonary effort is normal. No respiratory distress.     Breath sounds: No wheezing.  Abdominal:     General: Bowel sounds are normal. There is no distension.     Palpations: Abdomen is soft.  Musculoskeletal:        General: No deformity. Normal range of motion.     Cervical back: Normal range of motion and neck supple.  Skin:    General: Skin is dry.     Findings: No erythema or rash.  Neurological:     Mental Status: He is alert and oriented to person, place, and time. Mental status is at baseline.     Cranial Nerves: No cranial nerve deficit.  Psychiatric:        Mood and Affect: Mood normal.     LABORATORY DATA:  I have reviewed the data as listed    Latest Ref Rng & Units 06/12/2024    7:57 AM 05/14/2024    1:52 PM 05/02/2024    9:34 AM  CBC  WBC 4.0 - 10.5 K/uL 4.2  4.5  5.7   Hemoglobin 13.0 - 17.0 g/dL 88.1  88.3  88.2   Hematocrit 39.0 - 52.0 % 35.4  34.1  34.3   Platelets 150 - 400 K/uL 226  256.0  249.0       Latest Ref Rng & Units 05/02/2024    9:34 AM 10/20/2023    9:38 AM 08/26/2023    3:03 PM  CMP  Glucose 70 - 99 mg/dL 90  80  90   BUN 6 - 23 mg/dL 18  18  16    Creatinine 0.40 - 1.50 mg/dL 8.78  8.95  8.95   Sodium 135 -  145 mEq/L 141  142  144   Potassium 3.5 - 5.1 mEq/L 4.6  4.3  4.3   Chloride 96 - 112 mEq/L 103  105  107   CO2 19 - 32 mEq/L 31  30  30    Calcium  8.4 - 10.5 mg/dL 9.6  9.4  9.3   Total Protein 6.0 - 8.3 g/dL 6.5  6.6  6.3   Total Bilirubin 0.2 - 1.2 mg/dL 0.6  0.5  0.4   Alkaline Phos 39 - 117 U/L 25  36    AST 0 - 37 U/L 22  18  22    ALT 0 - 53 U/L 18  15  24        RADIOGRAPHIC STUDIES: I have personally reviewed the radiological images as listed and agreed with the findings in the report. DG ESOPHAGUS W DOUBLE CM (HD) Result Date: 07/04/2024 CLINICAL DATA:  58 year old male with a history of GERD, well controlled on medication, and worsening esophageal dysphagia with solid foods over the past several months. Admits to frequent  sensation of food getting stuck in his upper chest and an episode approximately 2 weeks ago in which he required help as he was choking on a piece of meat. Patient also reports a history of 2 esophageal dilations and being told he has bony protrusions in his cervical spine that could affect his swallowing. EXAM: ESOPHAGUS/BARIUM SWALLOW/TABLET STUDY TECHNIQUE: Combined double and single contrast examination was performed using effervescent crystals, high-density barium, and thin liquid barium. This exam was performed by [facility] PA-C, and was supervised and interpreted by Dr. DELENA Balder. FLUOROSCOPY: Radiation Exposure Index (as provided by the fluoroscopic device): 65.7 mGy Kerma COMPARISON:  Cervical spine-02/10/2016 FINDINGS: Swallowing: Appears normal. No vestibular penetration or aspiration seen. Pharynx: Unremarkable. Esophagus: No focal esophageal narrowing or stricture. No obvious mucosal abnormalities or visible ulcerations. Esophageal motility: Mild-to-moderate esophageal dysmotility leading to delayed passage of contrast from the esophagus into the stomach. Hiatal Hernia: None visualized Gastroesophageal reflux: None visualized with coughing or Valsalva.  Ingested 13mm barium tablet: Passed without difficulty into the stomach. Other: None. IMPRESSION: 1. Esophageal dysmotility. 2. No focal esophageal narrowing or stricture. Electronically Signed   By: Juliene Balder M.D.   On: 07/04/2024 10:31

## 2024-07-17 ENCOUNTER — Other Ambulatory Visit

## 2024-07-23 ENCOUNTER — Other Ambulatory Visit: Payer: Self-pay | Admitting: Internal Medicine

## 2024-07-24 ENCOUNTER — Telehealth: Payer: Self-pay

## 2024-07-24 MED ORDER — ONDANSETRON 4 MG PO TBDP
4.0000 mg | ORAL_TABLET | Freq: Three times a day (TID) | ORAL | 0 refills | Status: DC | PRN
Start: 2024-07-24 — End: 2024-08-14

## 2024-07-24 NOTE — Addendum Note (Signed)
 Addended by: MARYLYNN VERNEITA CROME on: 07/24/2024 05:20 PM   Modules accepted: Orders

## 2024-07-24 NOTE — Telephone Encounter (Signed)
 Pt is leaving to go on a cruise on Friday and would like to know if you could send in a rx for the dissolvable Zofran .

## 2024-08-07 DIAGNOSIS — R1319 Other dysphagia: Secondary | ICD-10-CM | POA: Diagnosis not present

## 2024-08-14 ENCOUNTER — Encounter: Payer: Self-pay | Admitting: Gastroenterology

## 2024-08-14 ENCOUNTER — Other Ambulatory Visit (INDEPENDENT_AMBULATORY_CARE_PROVIDER_SITE_OTHER)

## 2024-08-14 DIAGNOSIS — E669 Obesity, unspecified: Secondary | ICD-10-CM | POA: Diagnosis not present

## 2024-08-14 DIAGNOSIS — I10 Essential (primary) hypertension: Secondary | ICD-10-CM | POA: Diagnosis not present

## 2024-08-14 DIAGNOSIS — E782 Mixed hyperlipidemia: Secondary | ICD-10-CM | POA: Diagnosis not present

## 2024-08-14 DIAGNOSIS — E119 Type 2 diabetes mellitus without complications: Secondary | ICD-10-CM

## 2024-08-14 LAB — LIPID PANEL
Cholesterol: 139 mg/dL (ref 0–200)
HDL: 38.7 mg/dL — ABNORMAL LOW (ref >39.00)
LDL Cholesterol: 79 mg/dL (ref 0–99)
NonHDL: 100.34
Total CHOL/HDL Ratio: 4
Triglycerides: 108 mg/dL (ref 0.0–149.0)
VLDL: 21.6 mg/dL (ref 0.0–40.0)

## 2024-08-14 LAB — COMPREHENSIVE METABOLIC PANEL WITH GFR
ALT: 16 U/L (ref 0–53)
AST: 20 U/L (ref 0–37)
Albumin: 4.5 g/dL (ref 3.5–5.2)
Alkaline Phosphatase: 30 U/L — ABNORMAL LOW (ref 39–117)
BUN: 18 mg/dL (ref 6–23)
CO2: 29 meq/L (ref 19–32)
Calcium: 9.7 mg/dL (ref 8.4–10.5)
Chloride: 104 meq/L (ref 96–112)
Creatinine, Ser: 1.21 mg/dL (ref 0.40–1.50)
GFR: 66.51 mL/min (ref >60.00)
Glucose, Bld: 78 mg/dL (ref 70–99)
Potassium: 4.2 meq/L (ref 3.5–5.1)
Sodium: 141 meq/L (ref 135–145)
Total Bilirubin: 0.4 mg/dL (ref 0.2–1.2)
Total Protein: 6.8 g/dL (ref 6.0–8.3)

## 2024-08-14 LAB — HEMOGLOBIN A1C: Hgb A1c MFr Bld: 5.4 % (ref 4.6–6.5)

## 2024-08-14 LAB — LDL CHOLESTEROL, DIRECT: Direct LDL: 96 mg/dL

## 2024-08-17 ENCOUNTER — Ambulatory Visit: Payer: Self-pay | Admitting: Anesthesiology

## 2024-08-17 ENCOUNTER — Ambulatory Visit
Admission: RE | Admit: 2024-08-17 | Discharge: 2024-08-17 | Disposition: A | Discharge status: Home / Self Care | Attending: Gastroenterology | Admitting: Gastroenterology

## 2024-08-17 ENCOUNTER — Encounter
Admission: RE | Disposition: A | Discharge status: Home / Self Care | Payer: Self-pay | Source: Home / Self Care | Attending: Gastroenterology

## 2024-08-17 ENCOUNTER — Ambulatory Visit: Payer: Self-pay | Admitting: Internal Medicine

## 2024-08-17 ENCOUNTER — Other Ambulatory Visit: Payer: Self-pay

## 2024-08-17 ENCOUNTER — Encounter: Payer: Self-pay | Admitting: Gastroenterology

## 2024-08-17 DIAGNOSIS — Z7984 Long term (current) use of oral hypoglycemic drugs: Secondary | ICD-10-CM | POA: Diagnosis not present

## 2024-08-17 DIAGNOSIS — K219 Gastro-esophageal reflux disease without esophagitis: Secondary | ICD-10-CM | POA: Insufficient documentation

## 2024-08-17 DIAGNOSIS — G4730 Sleep apnea, unspecified: Secondary | ICD-10-CM | POA: Diagnosis not present

## 2024-08-17 DIAGNOSIS — R1314 Dysphagia, pharyngoesophageal phase: Secondary | ICD-10-CM | POA: Diagnosis not present

## 2024-08-17 DIAGNOSIS — E119 Type 2 diabetes mellitus without complications: Secondary | ICD-10-CM | POA: Insufficient documentation

## 2024-08-17 DIAGNOSIS — K31A0 Gastric intestinal metaplasia, unspecified: Secondary | ICD-10-CM | POA: Insufficient documentation

## 2024-08-17 DIAGNOSIS — R1310 Dysphagia, unspecified: Secondary | ICD-10-CM | POA: Diagnosis not present

## 2024-08-17 DIAGNOSIS — E785 Hyperlipidemia, unspecified: Secondary | ICD-10-CM | POA: Diagnosis not present

## 2024-08-17 DIAGNOSIS — K3189 Other diseases of stomach and duodenum: Secondary | ICD-10-CM | POA: Insufficient documentation

## 2024-08-17 DIAGNOSIS — I10 Essential (primary) hypertension: Secondary | ICD-10-CM | POA: Diagnosis not present

## 2024-08-17 DIAGNOSIS — K2970 Gastritis, unspecified, without bleeding: Secondary | ICD-10-CM | POA: Diagnosis not present

## 2024-08-17 DIAGNOSIS — K2950 Unspecified chronic gastritis without bleeding: Secondary | ICD-10-CM | POA: Diagnosis not present

## 2024-08-17 HISTORY — PX: ESOPHAGOGASTRODUODENOSCOPY: SHX5428

## 2024-08-17 HISTORY — DX: Fatty (change of) liver, not elsewhere classified: K76.0

## 2024-08-17 HISTORY — DX: Other dysphagia: R13.19

## 2024-08-17 HISTORY — DX: Obesity, unspecified: E66.9

## 2024-08-17 HISTORY — DX: Sleep apnea, unspecified: G47.30

## 2024-08-17 LAB — GLUCOSE, CAPILLARY: Glucose-Capillary: 96 mg/dL (ref 70–99)

## 2024-08-17 SURGERY — EGD (ESOPHAGOGASTRODUODENOSCOPY)
Anesthesia: Monitor Anesthesia Care | Site: Mouth

## 2024-08-17 MED ORDER — STERILE WATER FOR IRRIGATION IR SOLN
Status: DC | PRN
  Administered 2024-08-17: 500 mL

## 2024-08-17 MED ORDER — PROPOFOL 10 MG/ML IV BOLUS
INTRAVENOUS | Status: DC | PRN
  Administered 2024-08-17: 160 ug/kg/min via INTRAVENOUS
  Administered 2024-08-17: 200 mg via INTRAVENOUS

## 2024-08-17 MED ORDER — LACTATED RINGERS IV SOLN: INTRAVENOUS | Status: DC

## 2024-08-17 MED ORDER — LIDOCAINE HCL (CARDIAC) PF 100 MG/5ML IV SOSY
PREFILLED_SYRINGE | INTRAVENOUS | Status: DC | PRN
  Administered 2024-08-17: 100 mg via INTRAVENOUS

## 2024-08-17 MED ORDER — PROPOFOL 10 MG/ML IV BOLUS
INTRAVENOUS | Status: AC
  Filled 2024-08-17: qty 20

## 2024-08-17 SURGICAL SUPPLY — 18 items
BALLN DILATOR ESOPH 8 10 CRE (MISCELLANEOUS) IMPLANT
BALLOON DILATOR 12-15 8 (BALLOONS) IMPLANT
BALLOON DILATOR 15-18 8 (BALLOONS) IMPLANT
BALLOON DILATOR CRE 0-12 8 (BALLOONS) IMPLANT
BLOCK BITE 60FR ADLT L/F GRN (MISCELLANEOUS) ×1 IMPLANT
CLIP HMST 235XBRD CATH ROT (MISCELLANEOUS) IMPLANT
ELECTRODE REM PT RTRN 9FT ADLT (ELECTROSURGICAL) IMPLANT
FORCEPS BIOP RAD 4 LRG CAP 4 (CUTTING FORCEPS) IMPLANT
GOWN CVR UNV OPN BCK APRN NK (MISCELLANEOUS) ×2 IMPLANT
INJECTOR VARIJECT VIN23 (MISCELLANEOUS) IMPLANT
KIT DEFENDO VALVE AND CONN (KITS) IMPLANT
KIT PRC NS LF DISP ENDO (KITS) ×1 IMPLANT
MANIFOLD NEPTUNE II (INSTRUMENTS) ×1 IMPLANT
MARKER SPOT ENDO TATTOO 5ML (MISCELLANEOUS) IMPLANT
RETRIEVER NET PLAT FOOD (MISCELLANEOUS) IMPLANT
SYR INFLATION 60ML (SYRINGE) IMPLANT
WATER STERILE IRR 250ML POUR (IV SOLUTION) ×1 IMPLANT
WIRE CRE 18-20MM 8CM F G (MISCELLANEOUS) IMPLANT

## 2024-08-17 NOTE — Anesthesia Preprocedure Evaluation (Signed)
 Anesthesia Evaluation  Patient identified by MRN, date of birth, ID band Patient awake    Reviewed: Allergy & Precautions, H&P , NPO status , Patient's Chart, lab work & pertinent test results  History of Anesthesia Complications (+) history of anesthetic complications  Airway Mallampati: III  TM Distance: >3 FB Neck ROM: Full    Dental no notable dental hx.    Pulmonary neg pulmonary ROS, sleep apnea    Pulmonary exam normal breath sounds clear to auscultation       Cardiovascular hypertension, negative cardio ROS Normal cardiovascular exam Rhythm:Regular Rate:Normal  09-14-19 echo 1. Left ventricular ejection fraction, by visual estimation, is 60 to  65%. The left ventricle has normal function. There is no left ventricular  hypertrophy.   2. Global right ventricle has normal systolic function.The right  ventricular size is normal. No increase in right ventricular wall  thickness.   3. Left atrial size was mildly dilated.   4. The pulmonic valve was normal in structure. Pulmonic valve  regurgitation is not visualized.   5. TR signal is inadequate for assessing pulmonary artery systolic  pressure.     Neuro/Psych  Neuromuscular disease negative neurological ROS  negative psych ROS   GI/Hepatic negative GI ROS, Neg liver ROS,GERD  ,,  Endo/Other  negative endocrine ROSdiabetes    Renal/GU negative Renal ROS  negative genitourinary   Musculoskeletal negative musculoskeletal ROS (+)    Abdominal   Peds negative pediatric ROS (+)  Hematology negative hematology ROS (+) Blood dyscrasia, anemia   Anesthesia Other Findings Medical History Hyperlipidemia  Hypertension GERD (gastroesophageal reflux disease) Complication of anesthesia with tonsillectomy, severe throat swelling, possible laryngospasm Diabetes mellitus without complication (Cancer (HCC) Anemia  Allergy Skin cancer  Sleep apnea Obesity Hepatic  steatosis    Reproductive/Obstetrics negative OB ROS                              Anesthesia Physical Anesthesia Plan  ASA: 2  Anesthesia Plan: MAC   Post-op Pain Management:    Induction: Intravenous  PONV Risk Score and Plan:   Airway Management Planned: Natural Airway and Nasal Cannula  Additional Equipment:   Intra-op Plan:   Post-operative Plan:   Informed Consent: I have reviewed the patients History and Physical, chart, labs and discussed the procedure including the risks, benefits and alternatives for the proposed anesthesia with the patient or authorized representative who has indicated his/her understanding and acceptance.     Dental Advisory Given  Plan Discussed with: Anesthesiologist, CRNA and Surgeon  Anesthesia Plan Comments: (Patient consented for risks of anesthesia including but not limited to:  - adverse reactions to medications - damage to eyes, teeth, lips or other oral mucosa - nerve damage due to positioning  - sore throat or hoarseness - Damage to heart, brain, nerves, lungs, other parts of body or loss of life  Patient voiced understanding and assent.)         Anesthesia Quick Evaluation

## 2024-08-17 NOTE — Transfer of Care (Signed)
 Immediate Anesthesia Transfer of Care Note  Patient: Christopher Vazquez  Procedure(s) Performed: EGD (ESOPHAGOGASTRODUODENOSCOPY) (Mouth)  Patient Location: PACU  Anesthesia Type: MAC  Level of Consciousness: awake, alert  and patient cooperative  Airway and Oxygen Therapy: Patient Spontanous Breathing   Post-op Assessment: Post-op Vital signs reviewed, Patient's Cardiovascular Status Stable, Respiratory Function Stable, Patent Airway and No signs of Nausea or vomiting  Post-op Vital Signs: Reviewed and stable  Complications: No notable events documented.

## 2024-08-17 NOTE — H&P (Signed)
 Christopher JONELLE Brooklyn, MD [facility], [facility] [address]  [place], [place] [address]  Main: [phone] Fax:  [phone] Pager: [phone]   Primary Care Physician:  Marylynn Verneita CROME, MD Primary Gastroenterologist:  Dr. Corinn JONELLE Vazquez  Pre-Procedure History & Physical: HPI:  Christopher Vazquez is a 58 y.o. male is here for an endoscopy.   Past Medical History:  Diagnosis Date   Allergy    Anemia 2025   Cancer (HCC)    basal cell-nose   Complication of anesthesia    with tonsillectomy sever swelling of throat, possible laryngiospasm.   Diabetes mellitus without complication (HCC)    GERD (gastroesophageal reflux disease) 2012   Hyperlipidemia    Hypertension    Skin cancer 2020   Sleep apnea    lost weight and it has resolved    Past Surgical History:  Procedure Laterality Date   BIOPSY OF SKIN SUBCUTANEOUS TISSUE AND/OR MUCOUS MEMBRANE     nose   CHOLESTEATOMA EXCISION Left 2000   left ear   COLONOSCOPY WITH PROPOFOL  N/A 11/17/2017   Procedure: COLONOSCOPY WITH PROPOFOL ;  Surgeon: Vazquez Christopher Skiff, MD;  Location: [facility] ENDOSCOPY;  Service: Gastroenterology;  Laterality: N/A;   ESOPHAGOGASTRODUODENOSCOPY (EGD) WITH PROPOFOL  N/A 09/16/2020   Procedure: ESOPHAGOGASTRODUODENOSCOPY (EGD) WITH PROPOFOL ;  Surgeon: Vazquez Christopher Skiff, MD;  Location: [facility] ENDOSCOPY;  Service: Gastroenterology;  Laterality: N/A;   HERNIA REPAIR     KNEE ARTHROSCOPY WITH MEDIAL MENISECTOMY Left 07/01/2016   Procedure: left knee arthroscopy with partial medial menisectomy;  Surgeon: Ozell Flake, MD;  Location: [facility] ORS;  Service: Orthopedics;  Laterality: Left;   TONSILLECTOMY AND ADENOIDECTOMY     UMBILICAL HERNIA REPAIR N/A 08/20/2019   Procedure: LAPAROSCOPIC ASSISTED UMBILICAL HERNIA REPAIR WITH MESH;  Surgeon: Gladis Cough, MD;  Location: [facility] ORS;  Service: General;  Laterality: N/A;    Prior to Admission medications   Medication Sig Start Date End Date Taking?  Authorizing Provider  aspirin 81 MG tablet Take 81 mg by mouth at bedtime.   Yes [provider]  fenofibrate  160 MG tablet Take 1 tablet (160 mg total) by mouth daily. 03/13/24  Yes Marylynn Verneita CROME, MD  folic acid  (FOLVITE ) 1 MG tablet Take 1 mg by mouth daily.   Yes [provider]  metFORMIN  (GLUCOPHAGE ) 1000 MG tablet Take 1 tablet (1,000 mg total) by mouth 2 (two) times daily with a meal. 06/19/24  Yes Marylynn Verneita CROME, MD  montelukast  (SINGULAIR ) 10 MG tablet Take 1 tablet (10 mg total) by mouth at bedtime. 06/19/24  Yes Marylynn Verneita CROME, MD  pantoprazole  (PROTONIX ) 40 MG tablet Take 1 tablet by mouth once daily 07/23/24  Yes Tullo, Teresa L, MD  sildenafil  (REVATIO ) 20 MG tablet TAKE 1 TABLET BY MOUTH THREE TIMES DAILY ( 06/28/24  Yes Marylynn Verneita CROME, MD  telmisartan -hydrochlorothiazide  (MICARDIS  HCT) 80-25 MG tablet Take 1 tablet by mouth daily. Patient taking differently: Take 1 tablet by mouth daily. Pt taking 40-12.5mg  change by MD 10/20/23  Yes Marylynn Verneita CROME, MD  traZODone  (DESYREL ) 100 MG tablet TAKE 1 TABLET AT BEDTIME ASNEEDED FOR SLEEP 07/23/24  Yes Marylynn Verneita CROME, MD  atorvastatin  (LIPITOR) 80 MG tablet Take 1 tablet (80 mg total) by mouth daily. Patient not taking: Reported on 07/11/2024 07/21/23 10/19/24  Marylynn Verneita CROME, MD  terbinafine  (LAMISIL ) 250 MG tablet Take 1 tablet (250 mg total) by mouth daily. Patient not taking: Reported on 08/17/2024 06/19/24   Marylynn Verneita CROME,  MD    Allergies as of 08/07/2024 - Review Complete 07/11/2024  Allergen Reaction Noted   Mounjaro  [tirzepatide ] Other (See Comments) 10/20/2023   Niacin Hives, Itching, and Rash 02/27/2020   Shellfish allergy Anaphylaxis and Itching 05/10/2016   Scopolamine Other (See Comments) 05/10/2016   Niacin and related Rash 11/19/2015    Family History  Problem Relation Age of Onset   Diabetes Mother    Hypertension Mother    Uterine cancer Mother    Cancer Mother    Varicose Veins Mother     Aortic aneurysm Father 50   Heart disease Father 50   AAA (abdominal aortic aneurysm) Father    Arthritis Father    Cancer Father    Hyperlipidemia Father    Kidney disease Father    Kidney cancer Sister    Cancer Sister    Kidney disease Sister    Atrial fibrillation Son    Asthma Maternal Grandfather    Diabetes Maternal Grandfather    Early death Paternal Grandfather    Heart disease Paternal Grandfather    Stroke Paternal Grandfather    Cancer Maternal Aunt     Social History   Socioeconomic History   Marital status: Married    Spouse name: Not on file   Number of children: 3   Years of education: Not on file   Highest education level: Associate degree: academic program  Occupational History   Occupation: Event organiser: [employer]  Tobacco Use   Smoking status: Never   Smokeless tobacco: Never  Vaping Use   Vaping status: Never Used  Substance and Sexual Activity   Alcohol use: Yes    Comment: 1-2 drinks a month   Drug use: No   Sexual activity: Yes    Birth control/protection: None    Comment: Married  Other Topics Concern   Not on file  Social History Narrative   Not on file   Social Drivers of Health   Financial Resource Strain: Low Risk  (06/18/2024)   Overall Financial Resource Strain (CARDIA)    Difficulty of Paying Living Expenses: Not very hard  Food Insecurity: No Food Insecurity (06/18/2024)   Hunger Vital Sign    Worried About Running Out of Food in the Last Year: Never true    Ran Out of Food in the Last Year: Never true  Transportation Needs: No Transportation Needs (06/18/2024)   PRAPARE - Administrator, Civil Service (Medical): No    Lack of Transportation (Non-Medical): No  Physical Activity: Insufficiently Active (06/18/2024)   Exercise Vital Sign    Days of Exercise per Week: 2 days    Minutes of Exercise per Session: 10 min  Stress: No Stress Concern Present (06/18/2024)   [employer] of Occupational Health  - Occupational Stress Questionnaire    Feeling of Stress: Only a little  Social Connections: Socially Integrated (06/18/2024)   Social Connection and Isolation Panel    Frequency of Communication with Friends and Family: More than three times a week    Frequency of Social Gatherings with Friends and Family: Twice a week    Attends Religious Services: More than 4 times per year    Active Member of [employer] or Organizations: Yes    Attends Banker Meetings: More than 4 times per year    Marital Status: Married  Catering manager Violence: Not At Risk (06/11/2024)   Humiliation, Afraid, Rape, and Kick questionnaire    Fear of Current or  Ex-Partner: No    Emotionally Abused: No    Physically Abused: No    Sexually Abused: No    Review of Systems: See HPI, otherwise negative ROS  Physical Exam: BP 116/70   Pulse 68   Temp 97.9 F (36.6 C) (Temporal)   Resp 14   Ht 5' 7 (1.702 m)   Wt 82.1 kg   SpO2 96%   BMI 28.35 kg/m  General:   Alert,  pleasant and cooperative in NAD Head:  Normocephalic and atraumatic. Neck:  Supple; no masses or thyromegaly. Lungs:  Clear throughout to auscultation.    Heart:  Regular rate and rhythm. Abdomen:  Soft, nontender and nondistended. Normal bowel sounds, without guarding, and without rebound.   Neurologic:  Alert and  oriented x4;  grossly normal neurologically.  Impression/Plan: DEVON PRETTY is here for an endoscopy to be performed for dysphagia   Risks, benefits, limitations, and alternatives regarding  endoscopy have been reviewed with the patient.  Questions have been answered.  All parties agreeable.   Christopher Brooklyn, MD  08/17/2024, 8:22 AM

## 2024-08-17 NOTE — Op Note (Signed)
 [facility] Patient Name: Christopher Vazquez Procedure Date: 08/17/2024 8:42 AM MRN: [id number] Account #: [id number] Date of Birth: 1966/05/04 Admit Type: Outpatient Age: 58 Room: [facility] OR ROOM 01 Gender: Male Note Status: Finalized Instrument Name: Endoscope [id number] Procedure:             Upper GI endoscopy Indications:           Esophageal dysphagia Providers:             Corinn Jess Brooklyn MD, MD Referring MD:          Verneita Kettering, MD (Referring MD) Medicines:             General Anesthesia Complications:         No immediate complications. Estimated blood loss: None. Procedure:             Pre-Anesthesia Assessment:                        - Prior to the procedure, a History and Physical was                         performed, and patient medications and allergies were                         reviewed. The patient is competent. The risks and                         benefits of the procedure and the sedation options and                         risks were discussed with the patient. All questions                         were answered and informed consent was obtained.                         Patient identification and proposed procedure were                         verified by the physician, the nurse, the                         anesthesiologist, the anesthetist and the technician                         in the pre-procedure area in the procedure room in the                         endoscopy suite. Mental Status Examination: alert and                         oriented. Airway Examination: normal oropharyngeal                         airway and neck mobility. Respiratory Examination:                         clear to auscultation. CV Examination: normal.  Prophylactic Antibiotics: The patient does not require                         prophylactic antibiotics. Prior Anticoagulants: The                         patient has taken no  anticoagulant or antiplatelet                         agents. ASA Grade Assessment: II - A patient with mild                         systemic disease. After reviewing the risks and                         benefits, the patient was deemed in satisfactory                         condition to undergo the procedure. The anesthesia                         plan was to use general anesthesia. Immediately prior                         to administration of medications, the patient was                         re-assessed for adequacy to receive sedatives. The                         heart rate, respiratory rate, oxygen saturations,                         blood pressure, adequacy of pulmonary ventilation, and                         response to care were monitored throughout the                         procedure. The physical status of the patient was                         re-assessed after the procedure.                        After obtaining informed consent, the endoscope was                         passed under direct vision. Throughout the procedure,                         the patient's blood pressure, pulse, and oxygen                         saturations were monitored continuously. The Endoscope                         was introduced through the mouth, and advanced to the  second part of duodenum. The upper GI endoscopy was                         accomplished without difficulty. The patient tolerated                         the procedure well. Findings:      The duodenal bulb and second portion of the duodenum were normal.      Diffuse mildly erythematous mucosa without bleeding was found in the       gastric antrum. Biopsies were taken with a cold forceps for Helicobacter       pylori testing.      The gastric body was normal. Biopsies were taken with a cold forceps for       histology.      The cardia and gastric fundus were normal on retroflexion.       Esophagogastric landmarks were identified: the gastroesophageal junction       was found at 40 cm from the incisors.      The gastroesophageal junction and examined esophagus were normal.       Biopsies were taken with a cold forceps for histology. Impression:            - Normal duodenal bulb and second portion of the                         duodenum.                        - Erythematous mucosa in the antrum. Biopsied.                        - Normal gastric body. Biopsied.                        - Esophagogastric landmarks identified.                        - Normal gastroesophageal junction and esophagus.                         Biopsied. Recommendation:        - Await pathology results.                        - Discharge patient to home (with escort).                        - Resume previous diet today.                        - Continue present medications. Procedure Code(s):     --- Professional ---                        [phone], Esophagogastroduodenoscopy, flexible,                         transoral; with biopsy, single or multiple Diagnosis Code(s):     --- Professional ---                        K31.89, Other diseases of  stomach and duodenum                        R13.14, Dysphagia, pharyngoesophageal phase CPT copyright 2022 American Medical Association. All rights reserved. The codes documented in this report are preliminary and upon coder review may  be revised to meet current compliance requirements. Dr. Corinn Brooklyn Corinn Jess Brooklyn MD, MD 08/17/2024 9:07:16 AM This report has been signed electronically. Number of Addenda: 0 Note Initiated On: 08/17/2024 8:42 AM Total Procedure Duration: 0 hours 7 minutes 2 seconds  Estimated Blood Loss:  Estimated blood loss: none.      [facility]

## 2024-08-17 NOTE — Anesthesia Postprocedure Evaluation (Signed)
 Anesthesia Post Note  Patient: Christopher Vazquez  Procedure(s) Performed: EGD (ESOPHAGOGASTRODUODENOSCOPY) (Mouth)  Patient location during evaluation: PACU Anesthesia Type: MAC Level of consciousness: awake and alert Pain management: pain level controlled Vital Signs Assessment: post-procedure vital signs reviewed and stable Respiratory status: spontaneous breathing, nonlabored ventilation, respiratory function stable and patient connected to nasal cannula oxygen Cardiovascular status: blood pressure returned to baseline and stable Postop Assessment: no apparent nausea or vomiting Anesthetic complications: no   No notable events documented.   Last Vitals:  Vitals:   08/17/24 0906 08/17/24 0915  BP: 94/61 113/70  Pulse: 61 60  Resp: 12 14  Temp: (!) 36.2 C (!) 36.2 C  SpO2: 95% 96%    Last Pain:  Vitals:   08/17/24 0915  TempSrc:   PainSc: 0-No pain                 Woodford Strege C Arvie Villarruel

## 2024-08-21 LAB — SURGICAL PATHOLOGY

## 2024-08-22 ENCOUNTER — Ambulatory Visit: Payer: Self-pay | Admitting: Gastroenterology

## 2024-09-02 ENCOUNTER — Other Ambulatory Visit: Payer: Self-pay | Admitting: Internal Medicine

## 2024-09-15 ENCOUNTER — Other Ambulatory Visit: Payer: Self-pay | Admitting: Internal Medicine

## 2024-09-18 ENCOUNTER — Ambulatory Visit: Admitting: Podiatry

## 2024-09-18 ENCOUNTER — Encounter: Payer: Self-pay | Admitting: Podiatry

## 2024-09-18 VITALS — Ht 67.0 in | Wt 181.0 lb

## 2024-09-18 DIAGNOSIS — B351 Tinea unguium: Secondary | ICD-10-CM | POA: Diagnosis not present

## 2024-09-18 NOTE — Progress Notes (Signed)
 Chief Complaint  Patient presents with   Nail Problem    Pt is here to f/u on bilateral toenails due to toenail fungus, states everything is going well, has no complaints.    Subjective: 58 y.o. male presenting today for follow-up evaluation of fungal nail infection.  Overall significant improvement.  Past Medical History:  Diagnosis Date   Allergy    Anemia 2025   Cancer (HCC)    basal cell-nose   Complication of anesthesia    with tonsillectomy sever swelling of throat, possible laryngiospasm.   Diabetes mellitus without complication (HCC)    Esophageal dysphagia    GERD (gastroesophageal reflux disease) 2012   Hepatic steatosis    Hyperlipidemia    Hypertension    Obesity    Skin cancer 2020   Sleep apnea    lost weight and it has resolved    Past Surgical History:  Procedure Laterality Date   BIOPSY OF SKIN SUBCUTANEOUS TISSUE AND/OR MUCOUS MEMBRANE     nose   CHOLESTEATOMA EXCISION Left 2000   left ear   COLONOSCOPY WITH PROPOFOL  N/A 11/17/2017   Procedure: COLONOSCOPY WITH PROPOFOL ;  Surgeon: Unk Corinn Skiff, MD;  Location: [facility] ENDOSCOPY;  Service: Gastroenterology;  Laterality: N/A;   ESOPHAGOGASTRODUODENOSCOPY N/A 08/17/2024   Procedure: EGD (ESOPHAGOGASTRODUODENOSCOPY);  Surgeon: Unk Corinn Skiff, MD;  Location: [facility];  Service: Endoscopy;  Laterality: N/A;   ESOPHAGOGASTRODUODENOSCOPY (EGD) WITH PROPOFOL  N/A 09/16/2020   Procedure: ESOPHAGOGASTRODUODENOSCOPY (EGD) WITH PROPOFOL ;  Surgeon: Unk Corinn Skiff, MD;  Location: [facility] ENDOSCOPY;  Service: Gastroenterology;  Laterality: N/A;   HERNIA REPAIR     KNEE ARTHROSCOPY WITH MEDIAL MENISECTOMY Left 07/01/2016   Procedure: left knee arthroscopy with partial medial menisectomy;  Surgeon: Ozell Flake, MD;  Location: [facility] ORS;  Service: Orthopedics;  Laterality: Left;   TONSILLECTOMY AND ADENOIDECTOMY     UMBILICAL HERNIA REPAIR N/A 08/20/2019   Procedure: LAPAROSCOPIC ASSISTED UMBILICAL  HERNIA REPAIR WITH MESH;  Surgeon: Gladis Cough, MD;  Location: [facility] ORS;  Service: General;  Laterality: N/A;    Allergies  Allergen Reactions   Mounjaro  [Tirzepatide ] Other (See Comments)    PANCREATITIS   Niacin Hives, Itching and Rash    Pins and needles.   Shellfish Allergy Anaphylaxis and Itching    throat starts to become scratchy, denies difficulty breathing or swelling of mouth, lips or throat. Denies problems with betadine topically. Other reaction(s): Other (See Comments) Dryness and tightness in  throat throat starts to become scratchy, denies difficulty breathing or swelling of mouth, lips or throat. Denies problems with betadine topically.   Scopolamine Other (See Comments)    vision Other reaction(s): Other (See Comments), Other (See Comments) vision vision   Niacin And Related Rash    Pens and needle sensation    03/16/2024  Objective: Physical Exam General: The patient is alert and oriented x3 in no acute distress.  Dermatology: Significant improvement noted.  There continues to be some hyperkeratotic, discolored, thickened, onychodystrophy to the distal ends of the toenails. Skin is warm, dry and supple bilateral lower extremities. Negative for open lesions or macerations.  Vascular: Palpable pedal pulses bilaterally. No edema or erythema noted. Capillary refill within normal limits.  Neurological: Grossly intact via light touch  Musculoskeletal Exam: No pedal deformity noted  Assessment: #1 Onychomycosis of toenails bilateral  Plan of Care:  -Patient was evaluated. -Additional 90-day supply of oral Lamisil  was prescribed by PCP.  Greatly appreciated.  He plans to finish this prescription.  After  this allow the nails to continue to grow out -Return to clinic PRN   Thresa EMERSON Sar, DPM [facility]  Dr. Thresa EMERSON Sar, DPM    [address] [place], [place] [address]                Office [phone]  Fax [phone]

## 2024-09-19 ENCOUNTER — Other Ambulatory Visit: Payer: Self-pay | Admitting: Internal Medicine

## 2024-09-30 ENCOUNTER — Ambulatory Visit
Admission: RE | Admit: 2024-09-30 | Discharge: 2024-09-30 | Disposition: A | Discharge status: Home / Self Care | Source: Ambulatory Visit | Attending: Emergency Medicine | Admitting: Emergency Medicine

## 2024-09-30 VITALS — BP 111/70 | HR 72 | Temp 98.1°F | Resp 20

## 2024-09-30 DIAGNOSIS — H6502 Acute serous otitis media, left ear: Secondary | ICD-10-CM

## 2024-09-30 MED ORDER — AMOXICILLIN-POT CLAVULANATE 875-125 MG PO TABS
1.0000 | ORAL_TABLET | Freq: Two times a day (BID) | ORAL | 0 refills | Status: AC
Start: 2024-09-30 — End: ?

## 2024-09-30 NOTE — Discharge Instructions (Signed)
 Today you are being treated for an infection of the eardrum, left side   Take Augmentin  twice daily for 7 days, you should begin to see improvement after 48 hours of medication use and then it should progressively get better  You may use Tylenol  or ibuprofen for management of discomfort  May hold warm compresses to the ear for additional comfort  Please not attempted any ear cleaning or object or fluid placement into the ear canal to prevent further irritation

## 2024-09-30 NOTE — ED Triage Notes (Signed)
 Patient reports left ear drainage  x 1.5 weeks. Patient has not taken anything for it. Patient also complains left ear pain. Rates pain 5/10.

## 2024-09-30 NOTE — ED Provider Notes (Signed)
 Christopher Vazquez    CSN: [id number] Arrival date & time: 09/30/24  1043      History   Chief Complaint Chief Complaint  Patient presents with   Ear Drainage    A lot of pressure in left ear. Very painful - Entered by patient   Otalgia    HPI Christopher Vazquez is a 58 y.o. male.   Patient presents for evaluation of left-sided ear pain, fullness drainage present for 10 days.  Has noticed a little blood within the drainage.  Endorses wearing earplugs for work but has not changed products or doing anything differently.  Has not attempted treatment.  History of a cholesteatoma excision.  Past Medical History:  Diagnosis Date   Allergy    Anemia 2025   Cancer (HCC)    basal cell-nose   Complication of anesthesia    with tonsillectomy sever swelling of throat, possible laryngiospasm.   Diabetes mellitus without complication (HCC)    Esophageal dysphagia    GERD (gastroesophageal reflux disease) 2012   Hepatic steatosis    Hyperlipidemia    Hypertension    Obesity    Skin cancer 2020   Sleep apnea    lost weight and it has resolved    Patient Active Problem List   Diagnosis Date Noted   Folic acid  deficiency anemia 06/11/2024   Right lower quadrant pain 07/22/2023   Onychomycosis 12/09/2022   Strain of latissimus dorsi muscle 06/07/2022   Grief 03/16/2022   Insomnia 09/08/2021   Exertional dyspnea 11/02/2020   Esophageal dysphagia    Disorder of bursae of shoulder region 09/08/2020   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   OSA (obstructive sleep apnea) 08/07/2020   Pain in joint of left shoulder 11/16/2019   Encounter for preventive health examination 10/23/2017   Obesity, diabetes, and hypertension syndrome (HCC) 08/18/2015   Vitamin D  deficiency 02/19/2014   Seasonal allergic rhinitis 02/02/2014   Dysphagia 03/07/2013   Hepatic steatosis 03/07/2013   Right upper quadrant abdominal pain 02/06/2013   Hyperlipidemia    Essential hypertension     Past  Surgical History:  Procedure Laterality Date   BIOPSY OF SKIN SUBCUTANEOUS TISSUE AND/OR MUCOUS MEMBRANE     nose   CHOLESTEATOMA EXCISION Left 2000   left ear   COLONOSCOPY WITH PROPOFOL  N/A 11/17/2017   Procedure: COLONOSCOPY WITH PROPOFOL ;  Surgeon: Unk Corinn Skiff, MD;  Location: [facility];  Service: Gastroenterology;  Laterality: N/A;   ESOPHAGOGASTRODUODENOSCOPY N/A 08/17/2024   Procedure: EGD (ESOPHAGOGASTRODUODENOSCOPY);  Surgeon: Unk Corinn Skiff, MD;  Location: [facility];  Service: Endoscopy;  Laterality: N/A;   ESOPHAGOGASTRODUODENOSCOPY (EGD) WITH PROPOFOL  N/A 09/16/2020   Procedure: ESOPHAGOGASTRODUODENOSCOPY (EGD) WITH PROPOFOL ;  Surgeon: Unk Corinn Skiff, MD;  Location: [facility];  Service: Gastroenterology;  Laterality: N/A;   HERNIA REPAIR     KNEE ARTHROSCOPY WITH MEDIAL MENISECTOMY Left 07/01/2016   Procedure: left knee arthroscopy with partial medial menisectomy;  Surgeon: Ozell Flake, MD;  Location: [facility];  Service: Orthopedics;  Laterality: Left;   TONSILLECTOMY AND ADENOIDECTOMY     UMBILICAL HERNIA REPAIR N/A 08/20/2019   Procedure: LAPAROSCOPIC ASSISTED UMBILICAL HERNIA REPAIR WITH MESH;  Surgeon: Gladis Cough, MD;  Location: [facility];  Service: General;  Laterality: N/A;       Home Medications    Prior to Admission medications   Medication Sig Start Date End Date Taking? Authorizing Provider  aspirin 81 MG tablet Take 81 mg by mouth at bedtime.    [provider]  fenofibrate  160 MG tablet TAKE ONE TABLET BY MOUTH ONE TIME DAILY 09/19/24   Marylynn Verneita CROME, MD  folic acid  (FOLVITE ) 1 MG tablet Take 1 mg by mouth daily.    [provider]  metFORMIN  (GLUCOPHAGE ) 1000 MG tablet Take 1 tablet (1,000 mg total) by mouth 2 (two) times daily with a meal. 06/19/24   Marylynn Verneita CROME, MD  montelukast  (SINGULAIR ) 10 MG tablet TAKE 1 TABLET AT BEDTIME 09/03/24   Marylynn Verneita CROME, MD  pantoprazole  (PROTONIX ) 40 MG tablet Take  1 tablet by mouth once daily 07/23/24   Tullo, Teresa L, MD  sildenafil  (REVATIO ) 20 MG tablet TAKE 1 TABLET BY MOUTH THREE TIMES DAILY ( 06/28/24   Marylynn Verneita CROME, MD  telmisartan -hydrochlorothiazide  (MICARDIS  HCT) 80-25 MG tablet Take 1 tablet by mouth daily. Patient taking differently: Take 1 tablet by mouth daily. Pt taking 40-12.5mg  change by MD 10/20/23   Marylynn Verneita CROME, MD  terbinafine  (LAMISIL ) 250 MG tablet TAKE ONE TABLET BY MOUTH ONE TIME DAILY 09/19/24   Marylynn Verneita CROME, MD  traZODone  (DESYREL ) 100 MG tablet TAKE 1 TABLET AT BEDTIME ASNEEDED FOR SLEEP 07/23/24   Marylynn Verneita CROME, MD    Family History Family History  Problem Relation Age of Onset   Diabetes Mother    Hypertension Mother    Uterine cancer Mother    Cancer Mother    Varicose Veins Mother    Aortic aneurysm Father 27   Heart disease Father 58   AAA (abdominal aortic aneurysm) Father    Arthritis Father    Cancer Father    Hyperlipidemia Father    Kidney disease Father    Kidney cancer Sister    Cancer Sister    Kidney disease Sister    Atrial fibrillation Son    Asthma Maternal Grandfather    Diabetes Maternal Grandfather    Early death Paternal Grandfather    Heart disease Paternal Grandfather    Stroke Paternal Grandfather    Cancer Maternal Aunt     Social History Social History   Tobacco Use   Smoking status: Never   Smokeless tobacco: Never  Vaping Use   Vaping status: Never Used  Substance Use Topics   Alcohol use: Yes    Comment: 1-2 drinks a month   Drug use: No     Allergies   Mounjaro  [tirzepatide ], Niacin, Shellfish allergy, Scopolamine, and Niacin and related   Review of Systems Review of Systems  HENT:  Positive for ear pain.      Physical Exam Triage Vital Signs ED Triage Vitals  Encounter Vitals Group     BP 09/30/24 1134 111/70     Girls Systolic BP Percentile --      Girls Diastolic BP Percentile --      Boys Systolic BP Percentile --      Boys Diastolic BP  Percentile --      Pulse Rate 09/30/24 1134 72     Resp 09/30/24 1134 20     Temp 09/30/24 1134 98.1 F (36.7 C)     Temp Source 09/30/24 1134 Oral     SpO2 09/30/24 1134 96 %     Weight --      Height --      Head Circumference --      Peak Flow --      Pain Score 09/30/24 1135 5     Pain Loc --      Pain Education --  Exclude from Growth Chart --    No data found.  Updated Vital Signs BP 111/70 (BP Location: Left Arm)   Pulse 72   Temp 98.1 F (36.7 C) (Oral)   Resp 20   SpO2 96%   Visual Acuity Right Eye Distance:   Left Eye Distance:   Bilateral Distance:    Right Eye Near:   Left Eye Near:    Bilateral Near:     Physical Exam Constitutional:      Appearance: Normal appearance.  HENT:     Right Ear: Tympanic membrane, ear canal and external ear normal.     Left Ear: Ear canal and external ear normal. Tympanic membrane is erythematous.  Eyes:     Extraocular Movements: Extraocular movements intact.  Pulmonary:     Effort: Pulmonary effort is normal.  Neurological:     Mental Status: He is alert and oriented to person, place, and time. Mental status is at baseline.      UC Treatments / Results  Labs (all labs ordered are listed, but only abnormal results are displayed) Labs Reviewed - No data to display  EKG   Radiology No results found.  Procedures Procedures (including critical care time)  Medications Ordered in UC Medications - No data to display  Initial Impression / Assessment and Plan / UC Course  I have reviewed the triage vital signs and the nursing notes.  Pertinent labs & imaging results that were available during my care of the patient were reviewed by me and considered in my medical decision making (see chart for details).  Nonrecurrent acute serous otitis media of left ear  Erythema to the tympanic membrane is consistent with infection, prescribed augmentin , advised against ear cleaning, may use over-the-counter analgesics  and warm compresses to the external ear for comfort, may follow-up if symptoms persist worsen or recur  Final Clinical Impressions(s) / UC Diagnoses   Final diagnoses:  None   Discharge Instructions   None    ED Prescriptions   None    PDMP not reviewed this encounter.   Teresa Shelba SAUND[place], [place] 09/30/24 1209

## 2024-10-15 ENCOUNTER — Ambulatory Visit

## 2024-10-17 ENCOUNTER — Other Ambulatory Visit: Payer: Self-pay | Admitting: Internal Medicine

## 2024-10-18 ENCOUNTER — Other Ambulatory Visit: Payer: Self-pay

## 2024-10-18 ENCOUNTER — Emergency Department

## 2024-10-18 DIAGNOSIS — R200 Anesthesia of skin: Secondary | ICD-10-CM | POA: Insufficient documentation

## 2024-10-18 DIAGNOSIS — Z5321 Procedure and treatment not carried out due to patient leaving prior to being seen by health care provider: Secondary | ICD-10-CM | POA: Diagnosis not present

## 2024-10-18 DIAGNOSIS — R079 Chest pain, unspecified: Secondary | ICD-10-CM | POA: Diagnosis not present

## 2024-10-18 DIAGNOSIS — R059 Cough, unspecified: Secondary | ICD-10-CM | POA: Insufficient documentation

## 2024-10-18 LAB — BASIC METABOLIC PANEL WITH GFR
Anion gap: 13 (ref 5–15)
BUN: 18 mg/dL (ref 6–20)
CO2: 23 mmol/L (ref 22–32)
Calcium: 9.5 mg/dL (ref 8.9–10.3)
Chloride: 104 mmol/L (ref 98–111)
Creatinine, Ser: 1.15 mg/dL (ref 0.61–1.24)
GFR, Estimated: >60 mL/min (ref >60)
Glucose, Bld: 94 mg/dL (ref 70–99)
Potassium: 3.7 mmol/L (ref 3.5–5.1)
Sodium: 140 mmol/L (ref 135–145)

## 2024-10-18 LAB — CBC
HCT: 38.6 % — ABNORMAL LOW (ref 39.0–52.0)
Hemoglobin: 13.1 g/dL (ref 13.0–17.0)
MCH: 30.3 pg (ref 26.0–34.0)
MCHC: 33.9 g/dL (ref 30.0–36.0)
MCV: 89.1 fL (ref 80.0–100.0)
Platelets: 258 K/uL (ref 150–400)
RBC: 4.33 MIL/uL (ref 4.22–5.81)
RDW: 13.9 % (ref 11.5–15.5)
WBC: 5.9 K/uL (ref 4.0–10.5)
nRBC: 0 % (ref 0.0–0.2)

## 2024-10-18 LAB — TROPONIN T, HIGH SENSITIVITY: Troponin T High Sensitivity: <15 ng/L (ref 0–19)

## 2024-10-18 NOTE — ED Triage Notes (Signed)
 Pt reports chest pain and left arm numbness that began a few hours pta. Pt denies known cardiac hx. Pt reports he has also been having a dry cough for the past few days.

## 2024-10-19 ENCOUNTER — Emergency Department
Admission: EM | Admit: 2024-10-19 | Discharge: 2024-10-19 | Discharge status: Against Medical Advice | Attending: Emergency Medicine | Admitting: Emergency Medicine

## 2024-11-02 ENCOUNTER — Other Ambulatory Visit: Payer: Self-pay | Admitting: Internal Medicine

## 2024-11-15 ENCOUNTER — Inpatient Hospital Stay: Attending: Oncology

## 2024-11-15 DIAGNOSIS — Z85828 Personal history of other malignant neoplasm of skin: Secondary | ICD-10-CM | POA: Diagnosis not present

## 2024-11-15 DIAGNOSIS — Z8261 Family history of arthritis: Secondary | ICD-10-CM | POA: Insufficient documentation

## 2024-11-15 DIAGNOSIS — Z83438 Family history of other disorder of lipoprotein metabolism and other lipidemia: Secondary | ICD-10-CM | POA: Diagnosis not present

## 2024-11-15 DIAGNOSIS — I10 Essential (primary) hypertension: Secondary | ICD-10-CM | POA: Insufficient documentation

## 2024-11-15 DIAGNOSIS — D529 Folate deficiency anemia, unspecified: Secondary | ICD-10-CM | POA: Diagnosis present

## 2024-11-15 DIAGNOSIS — E785 Hyperlipidemia, unspecified: Secondary | ICD-10-CM | POA: Insufficient documentation

## 2024-11-15 DIAGNOSIS — Z79899 Other long term (current) drug therapy: Secondary | ICD-10-CM | POA: Diagnosis not present

## 2024-11-15 DIAGNOSIS — Z8049 Family history of malignant neoplasm of other genital organs: Secondary | ICD-10-CM | POA: Insufficient documentation

## 2024-11-15 DIAGNOSIS — Z8051 Family history of malignant neoplasm of kidney: Secondary | ICD-10-CM | POA: Insufficient documentation

## 2024-11-15 DIAGNOSIS — Z8419 Family history of other disorders of kidney and ureter: Secondary | ICD-10-CM | POA: Insufficient documentation

## 2024-11-15 DIAGNOSIS — R5383 Other fatigue: Secondary | ICD-10-CM | POA: Insufficient documentation

## 2024-11-15 DIAGNOSIS — K219 Gastro-esophageal reflux disease without esophagitis: Secondary | ICD-10-CM | POA: Insufficient documentation

## 2024-11-15 DIAGNOSIS — Z9089 Acquired absence of other organs: Secondary | ICD-10-CM | POA: Insufficient documentation

## 2024-11-15 DIAGNOSIS — Z823 Family history of stroke: Secondary | ICD-10-CM | POA: Insufficient documentation

## 2024-11-15 DIAGNOSIS — Z8249 Family history of ischemic heart disease and other diseases of the circulatory system: Secondary | ICD-10-CM | POA: Diagnosis not present

## 2024-11-15 DIAGNOSIS — M2550 Pain in unspecified joint: Secondary | ICD-10-CM | POA: Insufficient documentation

## 2024-11-15 DIAGNOSIS — Z7982 Long term (current) use of aspirin: Secondary | ICD-10-CM | POA: Insufficient documentation

## 2024-11-15 DIAGNOSIS — Z825 Family history of asthma and other chronic lower respiratory diseases: Secondary | ICD-10-CM | POA: Insufficient documentation

## 2024-11-15 DIAGNOSIS — Z833 Family history of diabetes mellitus: Secondary | ICD-10-CM | POA: Insufficient documentation

## 2024-11-15 DIAGNOSIS — E119 Type 2 diabetes mellitus without complications: Secondary | ICD-10-CM | POA: Insufficient documentation

## 2024-11-15 DIAGNOSIS — D649 Anemia, unspecified: Secondary | ICD-10-CM

## 2024-11-15 LAB — CBC WITH DIFFERENTIAL (CANCER CENTER ONLY)
Abs Immature Granulocytes: 0.01 K/uL (ref 0.00–0.07)
Basophils Absolute: 0 K/uL (ref 0.0–0.1)
Basophils Relative: 1 %
Eosinophils Absolute: 0.2 K/uL (ref 0.0–0.5)
Eosinophils Relative: 3 %
HCT: 38.7 % — ABNORMAL LOW (ref 39.0–52.0)
Hemoglobin: 13.1 g/dL (ref 13.0–17.0)
Immature Granulocytes: 0 %
Lymphocytes Relative: 22 %
Lymphs Abs: 1.3 K/uL (ref 0.7–4.0)
MCH: 30.3 pg (ref 26.0–34.0)
MCHC: 33.9 g/dL (ref 30.0–36.0)
MCV: 89.6 fL (ref 80.0–100.0)
Monocytes Absolute: 0.5 K/uL (ref 0.1–1.0)
Monocytes Relative: 8 %
Neutro Abs: 3.8 K/uL (ref 1.7–7.7)
Neutrophils Relative %: 66 %
Platelet Count: 235 K/uL (ref 150–400)
RBC: 4.32 MIL/uL (ref 4.22–5.81)
RDW: 14 % (ref 11.5–15.5)
WBC Count: 5.7 K/uL (ref 4.0–10.5)
nRBC: 0 % (ref 0.0–0.2)

## 2024-11-15 LAB — FOLATE: Folate: 6.6 ng/mL

## 2024-11-16 ENCOUNTER — Encounter: Payer: Self-pay | Admitting: Oncology

## 2024-11-16 ENCOUNTER — Inpatient Hospital Stay: Admitting: Oncology

## 2024-11-16 VITALS — BP 111/66 | HR 70 | Temp 98.5°F | Resp 18 | Wt 194.3 lb

## 2024-11-16 DIAGNOSIS — D520 Dietary folate deficiency anemia: Secondary | ICD-10-CM

## 2024-11-16 DIAGNOSIS — D529 Folate deficiency anemia, unspecified: Secondary | ICD-10-CM | POA: Diagnosis not present

## 2024-11-17 ENCOUNTER — Other Ambulatory Visit: Payer: Self-pay | Admitting: Internal Medicine

## 2024-11-17 NOTE — Assessment & Plan Note (Addendum)
 Folate level has normalized.  Anemia resolved. Recommend patient take multivitamin as maintenance.

## 2024-11-17 NOTE — Progress Notes (Signed)
 " Hematology/Oncology Progress note [phone] [phone]           REFERRING PROVIDER: Marylynn Verneita CROME, MD   CHIEF COMPLAINTS/REASON FOR VISIT:  Folate deficiency   ASSESSMENT & PLAN:   Folic acid  deficiency anemia Folate level has normalized.  Anemia resolved. Recommend patient take multivitamin as maintenance.   No orders of the defined types were placed in this encounter.  Follow-up as needed. All questions were answered. The patient knows to call the clinic with any problems, questions or concerns.  Zelphia Cap, MD, PhD [facility] Hematology Oncology 11/16/2024   HISTORY OF PRESENTING ILLNESS:   Christopher Vazquez is a  59 y.o.  male with PMH listed below was seen in consultation at the request of  Marylynn Verneita CROME, MD  for evaluation of anemia  Discussed the use of AI scribe software for clinical note transcription with the patient, who gave verbal consent to proceed.   Recent blood work showed mild anemia with hemoglobin levels decreasing from 14 one year ago to 11.7 in June and 11.6 in July 2025. This was identified during routine blood work conducted every three months due to his diabetes. He has been taking over-the-counter B12 supplements since being informed of his anemia, although his B12 levels were found to be normal.  He started experiencing significant fatigue about a month and a half ago, describing a lack of energy and difficulty getting out of bed in the mornings. No chronic kidney condition, alcohol use, or smoking. No night sweats, low-grade fever, or hot flushes. He experiences normal joint pains and has a history of knee surgery. He has been on trazodone  for sleep for years and reports waking once or twice at night to use the bathroom, which he considers normal.  He has a history of significant weight loss, having been on Mounjaro  for approximately 9-10 months, during which he lost significant amount of weight.  He began feeling unwell  during this period, and after experiencing elevated levels of an unspecified marker, he discontinued Mounjaro  about 1.5 to 2 months ago. Since stopping the medication, he has regained some weight, currently weighing between 173 and 177 pounds, and has adjusted his diet to include more protein.  He still does not feel appetite has recovery to his previous baseline.  He has a history of esophageal constriction requiring dilation procedures and reports recent issues with acid reflux and a raspy voice. He is up to date on cancer screenings, with a colonoscopy done six years ago and an esophageal scope in 2021, which included biopsies that returned normal results.  Patient denies any bleeding events. INTERVAL HISTORY Christopher Vazquez is a 59 y.o. male who has above history reviewed by me today presents for follow up visit for folate deficiency anemia.   He finished course of folic acid  supplementation, and has been off  for 8 months.  He reports feeling well.  He drinks alcohol sometimes.  No new complaints MEDICAL HISTORY:  Past Medical History:  Diagnosis Date   Allergy    Anemia 2025   Cancer (HCC)    basal cell-nose   Complication of anesthesia    with tonsillectomy sever swelling of throat, possible laryngiospasm.   Diabetes mellitus without complication (HCC)    Esophageal dysphagia    GERD (gastroesophageal reflux disease) 2012   Hepatic steatosis    Hyperlipidemia    Hypertension    Obesity    Skin cancer 2020   Sleep apnea  lost weight and it has resolved    SURGICAL HISTORY: Past Surgical History:  Procedure Laterality Date   BIOPSY OF SKIN SUBCUTANEOUS TISSUE AND/OR MUCOUS MEMBRANE     nose   CHOLESTEATOMA EXCISION Left 2000   left ear   COLONOSCOPY WITH PROPOFOL  N/A 11/17/2017   Procedure: COLONOSCOPY WITH PROPOFOL ;  Surgeon: Unk Corinn Skiff, MD;  Location: [facility];  Service: Gastroenterology;  Laterality: N/A;   ESOPHAGOGASTRODUODENOSCOPY N/A 08/17/2024    Procedure: EGD (ESOPHAGOGASTRODUODENOSCOPY);  Surgeon: Unk Corinn Skiff, MD;  Location[facility];  Service: Endoscopy;  Laterality: N/A;   ESOPHAGOGASTRODUODENOSCOPY (EGD) WITH PROPOFOL  N/A 09/16/2020   Procedure: ESOPHAGOGASTRODUODENOSCOPY (EGD) WITH PROPOFOL ;  Surgeon: Unk Corinn Skiff, MD;  Location: [facility];  Service: Gastroenterology;  Laterality: N/A;   HERNIA REPAIR     KNEE ARTHROSCOPY WITH MEDIAL MENISECTOMY Left 07/01/2016   Procedure: left knee arthroscopy with partial medial menisectomy;  Surgeon: Ozell Flake, MD;  Location: [facility];  Service: Orthopedics;  Laterality: Left;   TONSILLECTOMY AND ADENOIDECTOMY     UMBILICAL HERNIA REPAIR N/A 08/20/2019   Procedure: LAPAROSCOPIC ASSISTED UMBILICAL HERNIA REPAIR WITH MESH;  Surgeon: Gladis Cough, MD;  Location: [facility];  Service: General;  Laterality: N/A;    SOCIAL HISTORY: Social History   Socioeconomic History   Marital status: Married    Spouse name: Not on file   Number of children: 3   Years of education: Not on file   Highest education level: Associate degree: academic program  Occupational History   Occupation: Event Organiser: [employer]  Tobacco Use   Smoking status: Never   Smokeless tobacco: Never  Vaping Use   Vaping status: Never Used  Substance and Sexual Activity   Alcohol use: Yes    Comment: 1-2 drinks a month   Drug use: No   Sexual activity: Yes    Birth control/protection: None    Comment: Married  Other Topics Concern   Not on file  Social History Narrative   Not on file   Social Drivers of Health   Tobacco Use: Low Risk (11/16/2024)   Patient History    Smoking Tobacco Use: Never    Smokeless Tobacco Use: Never    Passive Exposure: Not on file  Financial Resource Strain: Low Risk (06/18/2024)   Overall Financial Resource Strain (CARDIA)    Difficulty of Paying Living Expenses: Not very hard  Food Insecurity: No Food Insecurity (06/18/2024)   Epic     Worried About Programme Researcher, Broadcasting/film/video in the Last Year: Never true    Ran Out of Food in the Last Year: Never true  Transportation Needs: No Transportation Needs (06/18/2024)   Epic    Lack of Transportation (Medical): No    Lack of Transportation (Non-Medical): No  Physical Activity: Insufficiently Active (06/18/2024)   Exercise Vital Sign    Days of Exercise per Week: 2 days    Minutes of Exercise per Session: 10 min  Stress: No Stress Concern Present (06/18/2024)   [employer] of Occupational Health - Occupational Stress Questionnaire    Feeling of Stress: Only a little  Social Connections: Socially Integrated (06/18/2024)   Social Connection and Isolation Panel    Frequency of Communication with Friends and Family: More than three times a week    Frequency of Social Gatherings with Friends and Family: Twice a week    Attends Religious Services: More than 4 times per year    Active Member of [employer] or Organizations:  Yes    Attends Club or Organization Meetings: More than 4 times per year    Marital Status: Married  Catering Manager Violence: Not At Risk (06/11/2024)   Epic    Fear of Current or Ex-Partner: No    Emotionally Abused: No    Physically Abused: No    Sexually Abused: No  Depression (PHQ2-9): Low Risk (07/11/2024)   Depression (PHQ2-9)    PHQ-2 Score: 0  Alcohol Screen: Low Risk (06/18/2024)   Alcohol Screen    Last Alcohol Screening Score (AUDIT): 2  Housing: Unknown (08/07/2024)   Received from [facility]   Epic    Unable to Pay for Housing in the Last Year: Not on file    Number of Times Moved in the Last Year: Not on file    At any time in the past 12 months, were you homeless or living in a shelter (including now)?: No  Utilities: Not At Risk (06/11/2024)   Epic    Threatened with loss of utilities: No  Health Literacy: Adequate Health Literacy (06/11/2024)   B1300 Health Literacy    Frequency of need for help with medical instructions: Never     FAMILY HISTORY: Family History  Problem Relation Age of Onset   Diabetes Mother    Hypertension Mother    Uterine cancer Mother    Cancer Mother    Varicose Veins Mother    Aortic aneurysm Father 10   Heart disease Father 44   AAA (abdominal aortic aneurysm) Father    Arthritis Father    Cancer Father    Hyperlipidemia Father    Kidney disease Father    Kidney cancer Sister    Cancer Sister    Kidney disease Sister    Atrial fibrillation Son    Asthma Maternal Grandfather    Diabetes Maternal Grandfather    Early death Paternal Grandfather    Heart disease Paternal Grandfather    Stroke Paternal Grandfather    Cancer Maternal Aunt     ALLERGIES:  is allergic to mounjaro  [tirzepatide ], niacin, shellfish allergy, scopolamine, and niacin and related.  MEDICATIONS:  Current Outpatient Medications  Medication Sig Dispense Refill   amoxicillin -clavulanate (AUGMENTIN ) 875-125 MG tablet Take 1 tablet by mouth every 12 (twelve) hours. 14 tablet 0   aspirin 81 MG tablet Take 81 mg by mouth at bedtime.     fenofibrate  160 MG tablet TAKE ONE TABLET BY MOUTH ONE TIME DAILY 90 tablet 1   folic acid  (FOLVITE ) 1 MG tablet Take 1 mg by mouth daily.     metFORMIN  (GLUCOPHAGE ) 1000 MG tablet Take 1 tablet (1,000 mg total) by mouth 2 (two) times daily with a meal. 180 tablet 1   montelukast  (SINGULAIR ) 10 MG tablet TAKE 1 TABLET AT BEDTIME 90 tablet 0   pantoprazole  (PROTONIX ) 40 MG tablet Take 1 tablet by mouth once daily 90 tablet 3   sildenafil  (REVATIO ) 20 MG tablet TAKE 1 TABLET BY MOUTH THREE TIMES DAILY 90 tablet 0   telmisartan -hydrochlorothiazide  (MICARDIS  HCT) 80-25 MG tablet Take 1 tablet by mouth daily. (Patient taking differently: Take 1 tablet by mouth daily. Pt taking 40-12.5mg  change by MD) 90 tablet 3   terbinafine  (LAMISIL ) 250 MG tablet TAKE ONE TABLET BY MOUTH ONE TIME DAILY 90 tablet 0   traZODone  (DESYREL ) 100 MG tablet TAKE 1 TABLET AT BEDTIME ASNEEDED FOR SLEEP 90  tablet 3   No current facility-administered medications for this visit.    Review of Systems  Constitutional:  Negative for appetite change, chills, fatigue and fever.  HENT:   Negative for hearing loss and voice change.   Eyes:  Negative for eye problems and icterus.  Respiratory:  Negative for chest tightness, cough and shortness of breath.   Cardiovascular:  Negative for chest pain and leg swelling.  Gastrointestinal:  Negative for abdominal distention and abdominal pain.  Endocrine: Negative for hot flashes.  Genitourinary:  Negative for difficulty urinating, dysuria and frequency.   Musculoskeletal:  Negative for arthralgias.  Skin:  Negative for itching and rash.  Neurological:  Negative for light-headedness and numbness.  Hematological:  Negative for adenopathy. Does not bruise/bleed easily.  Psychiatric/Behavioral:  Negative for confusion.    PHYSICAL EXAMINATION:  Vitals:   11/16/24 1204  BP: 111/66  Pulse: 70  Resp: 18  Temp: 98.5 F (36.9 C)  SpO2: 96%   Filed Weights   11/16/24 1204  Weight: 194 lb 4.8 oz (88.1 kg)    Physical Exam Constitutional:      General: He is not in acute distress.    Appearance: He is obese.  HENT:     Head: Normocephalic and atraumatic.  Eyes:     General: No scleral icterus. Cardiovascular:     Rate and Rhythm: Normal rate.  Pulmonary:     Effort: Pulmonary effort is normal. No respiratory distress.  Skin:    Findings: No rash.  Neurological:     Mental Status: He is alert and oriented to person, place, and time. Mental status is at baseline.  Psychiatric:        Mood and Affect: Mood normal.     LABORATORY DATA:  I have reviewed the data as listed    Latest Ref Rng & Units 11/15/2024    8:13 AM 10/18/2024    7:58 PM 06/12/2024    7:57 AM  CBC  WBC 4.0 - 10.5 K/uL 5.7  5.9  4.2   Hemoglobin 13.0 - 17.0 g/dL 86.8  86.8  88.1   Hematocrit 39.0 - 52.0 % 38.7  38.6  35.4   Platelets 150 - 400 K/uL 235  258  226        Latest Ref Rng & Units 10/18/2024    7:58 PM 08/14/2024    8:02 AM 05/02/2024    9:34 AM  CMP  Glucose 70 - 99 mg/dL 94  78  90   BUN 6 - 20 mg/dL 18  18  18    Creatinine 0.61 - 1.24 mg/dL 8.84  8.78  8.78   Sodium 135 - 145 mmol/L 140  141  141   Potassium 3.5 - 5.1 mmol/L 3.7  4.2  4.6   Chloride 98 - 111 mmol/L 104  104  103   CO2 22 - 32 mmol/L 23  29  31    Calcium  8.9 - 10.3 mg/dL 9.5  9.7  9.6   Total Protein 6.0 - 8.3 g/dL  6.8  6.5   Total Bilirubin 0.2 - 1.2 mg/dL  0.4  0.6   Alkaline Phos 39 - 117 U/L  30  25   AST 0 - 37 U/L  20  22   ALT 0 - 53 U/L  16  18       RADIOGRAPHIC STUDIES: I have personally reviewed the radiological images as listed and agreed with the findings in the report. DG Chest 2 View Result Date: 10/18/2024 CLINICAL DATA:  Chest pain, left arm numbness EXAM: CHEST - 2 VIEW COMPARISON:  07/28/2019  FINDINGS: Frontal and lateral views of the chest demonstrate an unremarkable cardiac silhouette. No acute airspace disease, effusion, or pneumothorax. No acute bony abnormalities. IMPRESSION: 1. No acute intrathoracic process. Electronically Signed   By: Ozell Daring M.D.   On: 10/18/2024 20:11         "

## 2024-11-19 NOTE — Telephone Encounter (Signed)
 Is it okay to refill the Micardis  80-25 mg? The chart states that pt was taking 40-12.5 mg but pt stated that he is still taking the 80-25 mg.

## 2024-11-19 NOTE — Telephone Encounter (Signed)
 LMTCB. Need clarify if pt is taking 80-25 mg or 40-12.5 mg.

## 2024-11-19 NOTE — Telephone Encounter (Signed)
 Copied from CRM [id number]. Topic: Clinical - Medication Question >> Nov 19, 2024 11:12 AM Thersia BROCKS wrote: Reason for CRM: Patient called back in regarding missed call from [facility] , patient stated he takes  80-25 mg

## 2024-12-03 ENCOUNTER — Other Ambulatory Visit: Payer: Self-pay | Admitting: Internal Medicine

## 2024-12-04 ENCOUNTER — Other Ambulatory Visit: Payer: Self-pay | Admitting: Medical Genetics

## 2024-12-04 DIAGNOSIS — Z006 Encounter for examination for normal comparison and control in clinical research program: Secondary | ICD-10-CM

## 2024-12-09 ENCOUNTER — Encounter: Payer: Self-pay | Admitting: Internal Medicine

## 2024-12-12 ENCOUNTER — Other Ambulatory Visit: Payer: Self-pay | Admitting: Internal Medicine

## 2024-12-12 MED ORDER — ATORVASTATIN CALCIUM 40 MG PO TABS: 40.0000 mg | ORAL_TABLET | Freq: Every day | ORAL | 1 refills | Status: AC

## 2024-12-12 NOTE — Telephone Encounter (Signed)
 Not in pt's current medication list. Looks like medication was discontinued at his hospital discharge in Sept.

## 2024-12-21 ENCOUNTER — Ambulatory Visit: Admitting: Internal Medicine

## 2024-12-25 ENCOUNTER — Ambulatory Visit: Admitting: Internal Medicine
